# Patient Record
Sex: Female | Born: 1950
Health system: Southern US, Community
[De-identification: ages and names within clinical notes are randomized; demographics above are authoritative.]

## PROBLEM LIST (undated history)

## (undated) DIAGNOSIS — K219 Gastro-esophageal reflux disease without esophagitis: Secondary | ICD-10-CM

## (undated) DIAGNOSIS — M1990 Unspecified osteoarthritis, unspecified site: Secondary | ICD-10-CM

## (undated) DIAGNOSIS — D649 Anemia, unspecified: Secondary | ICD-10-CM

## (undated) DIAGNOSIS — I10 Essential (primary) hypertension: Secondary | ICD-10-CM

## (undated) HISTORY — PX: TUBAL LIGATION: SHX77

---

## 1997-06-26 ENCOUNTER — Other Ambulatory Visit: Admission: RE | Admit: 1997-06-26 | Discharge: 1997-06-26 | Payer: Self-pay | Admitting: Internal Medicine

## 1998-10-23 ENCOUNTER — Emergency Department (HOSPITAL_COMMUNITY): Admission: EM | Admit: 1998-10-23 | Discharge: 1998-10-23 | Payer: Self-pay | Admitting: Emergency Medicine

## 1998-10-23 ENCOUNTER — Encounter: Payer: Self-pay | Admitting: Emergency Medicine

## 2002-07-12 ENCOUNTER — Other Ambulatory Visit: Admission: RE | Admit: 2002-07-12 | Discharge: 2002-07-12 | Payer: Self-pay | Admitting: Family Medicine

## 2002-10-25 ENCOUNTER — Emergency Department (HOSPITAL_COMMUNITY): Admission: EM | Admit: 2002-10-25 | Discharge: 2002-10-25 | Payer: Self-pay | Admitting: Emergency Medicine

## 2002-10-25 ENCOUNTER — Encounter: Payer: Self-pay | Admitting: Emergency Medicine

## 2003-09-08 ENCOUNTER — Emergency Department (HOSPITAL_COMMUNITY): Admission: EM | Admit: 2003-09-08 | Discharge: 2003-09-08 | Payer: Self-pay | Admitting: *Deleted

## 2005-03-24 ENCOUNTER — Ambulatory Visit: Payer: Self-pay | Admitting: Family Medicine

## 2005-03-29 ENCOUNTER — Ambulatory Visit: Payer: Self-pay | Admitting: *Deleted

## 2006-04-03 ENCOUNTER — Ambulatory Visit: Payer: Self-pay | Admitting: Family Medicine

## 2006-05-08 ENCOUNTER — Ambulatory Visit: Payer: Self-pay | Admitting: Family Medicine

## 2006-06-02 ENCOUNTER — Ambulatory Visit (HOSPITAL_COMMUNITY): Admission: RE | Admit: 2006-06-02 | Discharge: 2006-06-02 | Payer: Self-pay | Admitting: Family Medicine

## 2006-07-04 ENCOUNTER — Encounter (INDEPENDENT_AMBULATORY_CARE_PROVIDER_SITE_OTHER): Payer: Self-pay | Admitting: Family Medicine

## 2006-07-04 ENCOUNTER — Ambulatory Visit: Payer: Self-pay | Admitting: Family Medicine

## 2006-10-03 ENCOUNTER — Ambulatory Visit: Payer: Self-pay | Admitting: Family Medicine

## 2006-10-04 ENCOUNTER — Ambulatory Visit (HOSPITAL_COMMUNITY): Admission: RE | Admit: 2006-10-04 | Discharge: 2006-10-04 | Payer: Self-pay | Admitting: Family Medicine

## 2006-11-15 ENCOUNTER — Encounter (INDEPENDENT_AMBULATORY_CARE_PROVIDER_SITE_OTHER): Payer: Self-pay | Admitting: *Deleted

## 2008-06-14 ENCOUNTER — Emergency Department (HOSPITAL_COMMUNITY): Admission: EM | Admit: 2008-06-14 | Discharge: 2008-06-14 | Payer: Self-pay | Admitting: Emergency Medicine

## 2008-10-24 ENCOUNTER — Ambulatory Visit: Payer: Self-pay | Admitting: Family Medicine

## 2008-10-24 LAB — CONVERTED CEMR LAB
ALT: 16 units/L (ref 0–35)
Basophils Relative: 0 % (ref 0–1)
CO2: 22 meq/L (ref 19–32)
Cholesterol: 260 mg/dL — ABNORMAL HIGH (ref 0–200)
Creatinine, Ser: 0.73 mg/dL (ref 0.40–1.20)
Eosinophils Absolute: 0.1 10*3/uL (ref 0.0–0.7)
MCHC: 33.9 g/dL (ref 30.0–36.0)
MCV: 89.4 fL (ref 78.0–100.0)
Neutrophils Relative %: 57 % (ref 43–77)
Platelets: 369 10*3/uL (ref 150–400)
RDW: 14.7 % (ref 11.5–15.5)
Total Bilirubin: 0.7 mg/dL (ref 0.3–1.2)
Total CHOL/HDL Ratio: 4.1
VLDL: 32 mg/dL (ref 0–40)

## 2008-11-11 ENCOUNTER — Ambulatory Visit (HOSPITAL_COMMUNITY): Admission: RE | Admit: 2008-11-11 | Discharge: 2008-11-11 | Payer: Self-pay | Admitting: Family Medicine

## 2008-11-14 ENCOUNTER — Ambulatory Visit: Payer: Self-pay | Admitting: Family Medicine

## 2008-11-26 ENCOUNTER — Ambulatory Visit (HOSPITAL_COMMUNITY): Admission: RE | Admit: 2008-11-26 | Discharge: 2008-11-26 | Payer: Self-pay | Admitting: Family Medicine

## 2008-12-03 ENCOUNTER — Encounter: Admission: RE | Admit: 2008-12-03 | Discharge: 2008-12-03 | Payer: Self-pay | Admitting: Family Medicine

## 2009-06-11 ENCOUNTER — Ambulatory Visit: Payer: Self-pay | Admitting: Family Medicine

## 2009-06-11 LAB — CONVERTED CEMR LAB
BUN: 11 mg/dL (ref 6–23)
CO2: 23 meq/L (ref 19–32)
Calcium: 9.7 mg/dL (ref 8.4–10.5)
Creatinine, Ser: 0.68 mg/dL (ref 0.40–1.20)
Glucose, Bld: 86 mg/dL (ref 70–99)
HDL: 68 mg/dL (ref >39)
Rhuematoid fact SerPl-aCnc: <20 intl units/mL (ref 0–20)
Sed Rate: 6 mm/hr (ref 0–22)

## 2009-10-06 ENCOUNTER — Ambulatory Visit: Payer: Self-pay | Admitting: Internal Medicine

## 2010-01-22 ENCOUNTER — Ambulatory Visit (HOSPITAL_COMMUNITY): Admission: RE | Admit: 2010-01-22 | Discharge: 2010-01-22 | Payer: Self-pay | Admitting: Internal Medicine

## 2010-03-21 ENCOUNTER — Encounter: Payer: Self-pay | Admitting: Family Medicine

## 2011-02-14 ENCOUNTER — Other Ambulatory Visit (HOSPITAL_COMMUNITY): Payer: Self-pay | Admitting: Family Medicine

## 2011-02-14 DIAGNOSIS — Z1231 Encounter for screening mammogram for malignant neoplasm of breast: Secondary | ICD-10-CM

## 2011-03-18 ENCOUNTER — Ambulatory Visit (HOSPITAL_COMMUNITY): Payer: Self-pay | Attending: Family Medicine

## 2011-09-06 ENCOUNTER — Ambulatory Visit: Payer: Self-pay | Admitting: Internal Medicine

## 2011-09-06 DIAGNOSIS — Z0289 Encounter for other administrative examinations: Secondary | ICD-10-CM

## 2011-10-12 ENCOUNTER — Emergency Department (HOSPITAL_COMMUNITY)
Admission: EM | Admit: 2011-10-12 | Discharge: 2011-10-12 | Disposition: A | Discharge status: Home / Self Care | Payer: 59 | Attending: Emergency Medicine | Admitting: Emergency Medicine

## 2011-10-12 ENCOUNTER — Encounter (HOSPITAL_COMMUNITY): Payer: Self-pay | Admitting: Emergency Medicine

## 2011-10-12 DIAGNOSIS — M1990 Unspecified osteoarthritis, unspecified site: Secondary | ICD-10-CM

## 2011-10-12 DIAGNOSIS — M129 Arthropathy, unspecified: Secondary | ICD-10-CM | POA: Insufficient documentation

## 2011-10-12 DIAGNOSIS — Z760 Encounter for issue of repeat prescription: Secondary | ICD-10-CM | POA: Insufficient documentation

## 2011-10-12 DIAGNOSIS — I10 Essential (primary) hypertension: Secondary | ICD-10-CM | POA: Insufficient documentation

## 2011-10-12 HISTORY — DX: Unspecified osteoarthritis, unspecified site: M19.90

## 2011-10-12 HISTORY — DX: Essential (primary) hypertension: I10

## 2011-10-12 MED ORDER — DICLOFENAC SODIUM 75 MG PO TBEC: 75.0000 mg | DELAYED_RELEASE_TABLET | Freq: Two times a day (BID) | ORAL | Status: DC

## 2011-10-12 MED ORDER — HYDROCHLOROTHIAZIDE 12.5 MG PO TABS: 25.0000 mg | ORAL_TABLET | Freq: Every day | ORAL | Status: DC

## 2011-10-12 NOTE — ED Notes (Signed)
States has not taken BP meds in 2 months due to no PCP-denies chest pain, slight headache, dizziness-will continue to monitor

## 2011-10-12 NOTE — ED Provider Notes (Signed)
History     CSN: [id number]  Arrival date & time 10/12/11  1323   First MD Initiated Contact with Patient 10/12/11 1338      Chief Complaint  Patient presents with  . Hypertension    (Consider location/radiation/quality/duration/timing/severity/associated sxs/prior treatment) HPI  Pt presents to the ER with complaints of needing medication refills. She admits that she has not seen her doctor in 1 year but has an appointment coming up next Wednesday. Her employer today took her blood pressure and had her sent to the ED because it was elevated. She denies having any symptoms. She states that she also has arthritis medication that she needs refilled. BP 186/104 and the rest of her VS are stable.  Past Medical History  Diagnosis Date  . Hypertension   . Arthritis     History reviewed. No pertinent past surgical history.  No family history on file.  History  Substance Use Topics  . Smoking status: Never Smoker   . Smokeless tobacco: Not on file  . Alcohol Use: No    OB History    Grav Para Term Preterm Abortions TAB SAB Ect Mult Living                  Review of Systems   HEENT: denies blurry vision or change in hearing PULMONARY: Denies difficulty breathing and SOB CARDIAC: denies chest pain or heart palpitations MUSCULOSKELETAL:  denies being unable to ambulate ABDOMEN AL: denies abdominal pain GU: denies loss of bowel or urinary control NEURO: denies numbness and tingling in extremities SKIN: no new rashes PSYCH: patient denies anxiety or depression. NECK: Pt denies having neck pain   ]  Allergies  Shellfish allergy  Home Medications   Current Outpatient Rx  Name Route Sig Dispense Refill  . IBUPROFEN 200 MG PO TABS Oral Take 400 mg by mouth every 6 (six) hours as needed. Headache///arthritis pain    . DICLOFENAC SODIUM 75 MG PO TBEC Oral Take 1 tablet (75 mg total) by mouth 2 (two) times daily. 16 tablet 0  . HYDROCHLOROTHIAZIDE 12.5 MG PO TABS Oral  Take 2 tablets (25 mg total) by mouth daily. 8 tablet 0    BP 196/102  Pulse 63  Temp 98.8 F (37.1 C) (Oral)  Resp 20  SpO2 100%  Physical Exam  Nursing note and vitals reviewed. Constitutional: She appears well-developed and well-nourished. No distress.  HENT:  Head: Normocephalic and atraumatic.  Eyes: Pupils are equal, round, and reactive to light.  Neck: Normal range of motion. Neck supple.  Cardiovascular: Normal rate and regular rhythm.   Pulmonary/Chest: Effort normal.  Abdominal: Soft.  Neurological: She is alert.  Skin: Skin is warm and dry.    ED Course  Procedures (including critical care time)  Labs Reviewed - No data to display No results found.   1. Hypertension   2. Arthritis   3. Medication refill       MDM  Pt not having any red flag symptoms.   I have written her for 1 week of diclofenac and 1 week of hydrochlorothiazide. Dr. Audria Nine can change these or refill them at her upcomming appointment this Wednesday.  Pt has been advised of the symptoms that warrant their return to the ED. Patient has voiced understanding and has agreed to follow-up with the PCP or specialist.           Dorthula Matas, PA 10/12/11 1448

## 2011-10-12 NOTE — ED Provider Notes (Signed)
Medical screening examination/treatment/procedure(s) were performed by non-physician practitioner and as supervising physician I was immediately available for consultation/collaboration.  Juliet Rude. Rubin Payor, MD 10/12/11 1558

## 2011-10-12 NOTE — ED Notes (Signed)
Pt presenting to ed with c/o at elevated blood pressure pt denies chest pain, shortness of breath at this time. Pt states she has been out of her blood pressure medications x 2 months because she is a patient at [facility] and they are closed.pt states slight headache pain

## 2011-10-19 ENCOUNTER — Encounter: Payer: Self-pay | Admitting: Family Medicine

## 2011-10-19 ENCOUNTER — Ambulatory Visit (INDEPENDENT_AMBULATORY_CARE_PROVIDER_SITE_OTHER): Payer: 59 | Admitting: Family Medicine

## 2011-10-19 ENCOUNTER — Ambulatory Visit: Payer: 59

## 2011-10-19 VITALS — BP 126/90 | HR 76 | Temp 98.3°F | Resp 16 | Ht 61.0 in | Wt 139.8 lb

## 2011-10-19 DIAGNOSIS — I10 Essential (primary) hypertension: Secondary | ICD-10-CM

## 2011-10-19 DIAGNOSIS — Z13220 Encounter for screening for lipoid disorders: Secondary | ICD-10-CM

## 2011-10-19 DIAGNOSIS — M159 Polyosteoarthritis, unspecified: Secondary | ICD-10-CM

## 2011-10-19 DIAGNOSIS — M25562 Pain in left knee: Secondary | ICD-10-CM

## 2011-10-19 LAB — COMPREHENSIVE METABOLIC PANEL
ALT: 16 U/L (ref 0–35)
Albumin: 4.3 g/dL (ref 3.5–5.2)
CO2: 28 mEq/L (ref 19–32)
Calcium: 9.7 mg/dL (ref 8.4–10.5)
Chloride: 105 mEq/L (ref 96–112)
Potassium: 4 mEq/L (ref 3.5–5.3)
Sodium: 140 mEq/L (ref 135–145)
Total Protein: 7 g/dL (ref 6.0–8.3)

## 2011-10-19 LAB — T4, FREE: Free T4: 1.1 ng/dL (ref 0.80–1.80)

## 2011-10-19 MED ORDER — NABUMETONE 500 MG PO TABS: 500.0000 mg | ORAL_TABLET | Freq: Two times a day (BID) | ORAL | Status: DC

## 2011-10-19 MED ORDER — HYDROCHLOROTHIAZIDE 25 MG PO TABS: 25.0000 mg | ORAL_TABLET | Freq: Every day | ORAL | Status: DC

## 2011-10-19 NOTE — Progress Notes (Signed)
S: This 61 y.o. AA female has HTN and chronic left knee pain with swelling. She was seen at [facility] ED on 10/12/2011. Meds prescribed: HCTZ and Diclfonac(ineffective for pain). Cannot sleep because of pain. No relief with change of positions. No relief with Ibuprofen. Work involves walking and pt is on feet 80% of time. Prolonged sitting causes stiffness. No history of injury.  Re: HTN- pt denies any medication side effect; denies HA, visual disturbances, CP, SOB, chest tightness, palpitations, dizziness, lightheadedness, syncope or weakness.   O:  Filed Vitals:   10/19/11 1027  BP: 126/90  Pulse: 76  Temp: 98.3 F (36.8 C)  Resp: 16   GEN: In NAD; in mild distress when weight-bearing HENT: [place]/AT; EOMI; conj/sclerae clear COR: RRR LUNGS: Normal resp rate and effort MS: Left knee- mild deformity but no effusion; tender with palpation at joint line (medial > lateral aspect); ROM limited to 90 degrees of flexion. No joint laxity. McMurray's + NEURO: A&O x 3; CNs intact, otherwise nonfocal.   [facility] reading (PRIMARY) by  Dr. Audria Nine: Left knee- joint space narrowing with spurring; no fracture or dislocation.   A/P:  1. Knee pain, left - DJD evident on xray  Vitamin D, 25-hydroxy RX: Nabumetone 500 mg  1 tablet bid with meals Get OTC Flax Seed Oil capsules and take 1 daily Apply Arnica gel to joint 2-3 x day  2. HTN (hypertension)  Comprehensive metabolic panel, T4, Free RX; HCTZ 25 mg  1 tablet daily Nutrition changes  3. Screening for hyperlipidemia  LDL Cholesterol, Direct

## 2011-10-19 NOTE — Patient Instructions (Addendum)
Degenerative Arthritis You have osteoarthritis. This is the wear and tear arthritis that comes with aging. It is also called degenerative arthritis. This is common in people past middle age. It is caused by stress on the joints. The large weight bearing joints of the lower extremities are most often affected. The knees, hips, back, neck, and hands can become painful, swollen, and stiff. This is the most common type of arthritis. It comes on with age, carrying too much weight, or from an injury. Treatment includes resting the sore joint until the pain and swelling improve. Crutches or a walker may be needed for severe flares. Only take over-the-counter or prescription medicines for pain, discomfort, or fever as directed by your caregiver. Local heat therapy may improve motion. Cortisone shots into the joint are sometimes used to reduce pain and swelling during flares. Osteoarthritis is usually not crippling and progresses slowly. There are things you can do to decrease pain:  Avoid high impact activities.   Exercise regularly.   Low impact exercises such as walking, biking and swimming help to keep the muscles strong and keep normal joint function.   Stretching helps to keep your range of motion.   Lose weight if you are overweight. This reduces joint stress.  In severe cases when you have pain at rest or increasing disability, joint surgery may be helpful. See your caregiver for follow-up treatment as recommended.  SEEK IMMEDIATE MEDICAL CARE IF:   You have severe joint pain.   Marked swelling and redness in your joint develops.   You develop a high fever.  Document Released: 02/14/2005 Document Revised: 02/03/2011 Document Reviewed: 07/17/2006 [facility] Patient Information 2012 [place], [place].   Hypertension As your heart beats, it forces blood through your arteries. This force is your blood pressure. If the pressure is too high, it is called hypertension (HTN) or high blood pressure. HTN  is dangerous because you may have it and not know it. High blood pressure may mean that your heart has to work harder to pump blood. Your arteries may be narrow or stiff. The extra work puts you at risk for heart disease, stroke, and other problems.  Blood pressure consists of two numbers, a higher number over a lower, 110/72, for example. It is stated as "110 over 72." The ideal is below 120 for the top number (systolic) and under 80 for the bottom (diastolic). Write down your blood pressure today. You should pay close attention to your blood pressure if you have certain conditions such as:  Heart failure.   Prior heart attack.   Diabetes   Chronic kidney disease.   Prior stroke.   Multiple risk factors for heart disease.  To see if you have HTN, your blood pressure should be measured while you are seated with your arm held at the level of the heart. It should be measured at least twice. A one-time elevated blood pressure reading (especially in the Emergency Department) does not mean that you need treatment. There may be conditions in which the blood pressure is different between your right and left arms. It is important to see your caregiver soon for a recheck. Most people have essential hypertension which means that there is not a specific cause. This type of high blood pressure may be lowered by changing lifestyle factors such as:  Stress.   Smoking.   Lack of exercise.   Excessive weight.   Drug/tobacco/alcohol use.   Eating less salt.  Most people do not have symptoms from high  blood pressure until it has caused damage to the body. Effective treatment can often prevent, delay or reduce that damage. TREATMENT  When a cause has been identified, treatment for high blood pressure is directed at the cause. There are a large number of medications to treat HTN. These fall into several categories, and your caregiver will help you select the medicines that are best for you. Medications  may have side effects. You should review side effects with your caregiver. If your blood pressure stays high after you have made lifestyle changes or started on medicines,   Your medication(s) may need to be changed.   Other problems may need to be addressed.   Be certain you understand your prescriptions, and know how and when to take your medicine.   Be sure to follow up with your caregiver within the time frame advised (usually within two weeks) to have your blood pressure rechecked and to review your medications.   If you are taking more than one medicine to lower your blood pressure, make sure you know how and at what times they should be taken. Taking two medicines at the same time can result in blood pressure that is too low.  SEEK IMMEDIATE MEDICAL CARE IF:  You develop a severe headache, blurred or changing vision, or confusion.   You have unusual weakness or numbness, or a faint feeling.   You have severe chest or abdominal pain, vomiting, or breathing problems.  MAKE SURE YOU:   Understand these instructions.   Will watch your condition.   Will get help right away if you are not doing well or get worse.  Document Released: 02/14/2005 Document Revised: 02/03/2011 Document Reviewed: 10/05/2007 [facility] Patient Information 2012 [place], [place].   Get over-the-counter Flax Seed Oil capsules  1000-1200 mg per capsule and take 1 capsule daily; this will help reduce joint pain. Apply Arnica gel to painful joint 2-3 times daily for pain relief. If joint swells, try applying ice pack for 10 minutes twice a day.

## 2011-10-21 NOTE — Progress Notes (Signed)
Quick Note:  Please call pt and advise that the following labs are abnormal...  Vitamin D deficiency- Vitamin D 50,000 IU has been prescribed with refills. Take 1 capsule once a week.  Your chemistries are normal, blood sugar, kidney and liver tests are normal. LDL ("bad") cholesterol is elevated. Try to improve nutrition and take the Flax seed Oil as recommended. Some type of exercise will help improve your cholesterol numbers.  Copy to pt. ______

## 2011-12-22 ENCOUNTER — Ambulatory Visit: Payer: 59 | Admitting: Family Medicine

## 2011-12-27 ENCOUNTER — Ambulatory Visit: Payer: 59 | Admitting: Family Medicine

## 2012-02-24 ENCOUNTER — Other Ambulatory Visit: Payer: Self-pay | Admitting: Family Medicine

## 2012-02-24 MED ORDER — NABUMETONE 500 MG PO TABS: 500.0000 mg | ORAL_TABLET | Freq: Two times a day (BID) | ORAL | Status: DC

## 2012-02-24 MED ORDER — HYDROCHLOROTHIAZIDE 25 MG PO TABS: 25.0000 mg | ORAL_TABLET | Freq: Every day | ORAL | Status: DC

## 2012-05-24 ENCOUNTER — Ambulatory Visit: Payer: 59 | Admitting: Family Medicine

## 2012-06-20 ENCOUNTER — Encounter: Payer: Self-pay | Admitting: Family Medicine

## 2012-06-20 ENCOUNTER — Ambulatory Visit (INDEPENDENT_AMBULATORY_CARE_PROVIDER_SITE_OTHER): Payer: 59 | Admitting: Family Medicine

## 2012-06-20 VITALS — BP 142/90 | HR 87 | Temp 98.2°F | Resp 16 | Ht 61.75 in | Wt 129.2 lb

## 2012-06-20 DIAGNOSIS — M25569 Pain in unspecified knee: Secondary | ICD-10-CM

## 2012-06-20 DIAGNOSIS — G8929 Other chronic pain: Secondary | ICD-10-CM

## 2012-06-20 DIAGNOSIS — I10 Essential (primary) hypertension: Secondary | ICD-10-CM

## 2012-06-20 DIAGNOSIS — Z760 Encounter for issue of repeat prescription: Secondary | ICD-10-CM

## 2012-06-20 MED ORDER — HYDROCODONE-ACETAMINOPHEN 10-325 MG PO TABS: ORAL_TABLET | ORAL | Status: DC

## 2012-06-20 MED ORDER — NABUMETONE 500 MG PO TABS: 500.0000 mg | ORAL_TABLET | Freq: Two times a day (BID) | ORAL | Status: DC

## 2012-06-20 MED ORDER — HYDROCHLOROTHIAZIDE 25 MG PO TABS: 25.0000 mg | ORAL_TABLET | Freq: Every day | ORAL | Status: DC

## 2012-06-20 NOTE — Progress Notes (Signed)
S: This 62 y.o. AA female c/o chronic L knee pain pain which is getting worse; certain sudden turns/ movements cause excruciating pain. Despite taking prescription NSAID, pain is interrupting sleep. Her work requires that she be on her feet for 6+ hours/day, 5-6 days per week. Pt denies significant swelling in joint and has no hx of recent trauma.  Re: HTN- pt is compliant w/ medication w/o side effects. She denies fatigue, diaphoresis, CP or tightness, palpitations, muscle cramps, HA, dizziness, lightheadedness, numbness or syncope.  PMHx, Soc HX and Fam Hx reviewed.  ROS: As per HPI.  O: Filed Vitals:   06/20/12 1131  BP: 142/90  Pulse: 87  Temp: 98.2 F (36.8 C)  Resp: 16   GEN: In NAD; WN,WD. HENT: [place]/AT; EOMI w/ clear conj/ sclerae. Otherwise normal. COR: RRR. No edema. LUNGS: Normal resp rate and effort. MS: L knee- degenerative changes w/ deformity of tibial plateau medially. No effusion or redness. ROM decreased. NEURO: A&O x 3; CNs intact. Nonfocal. Gait- antalgic (favoring L leg).  XRAY (Aug 2013): L knee- moderate and progressive tricompartment OA.    A/P: Chronic knee pain, left - increasing pain and disability.  Plan: MR Knee Left  Wo Contrast; continue current medications. RX: HC-APAP 10-325   Take 1/2- 1 tablet at bedtime for pain.  HTN, goal below 140/90- stable; no medication change at this time.  Issue of repeat prescriptions  Meds ordered this encounter  Medications  . hydrochlorothiazide (HYDRODIURIL) 25 MG tablet    Sig: Take 1 tablet (25 mg total) by mouth daily.    Dispense:  30 tablet    Refill:  5  . nabumetone (RELAFEN) 500 MG tablet    Sig: Take 1 tablet (500 mg total) by mouth 2 (two) times daily. Take tablet with meal.    Dispense:  60 tablet    Refill:  3  . HYDROcodone-acetaminophen (NORCO) 10-325 MG per tablet    Sig: Take 1/2 or 1 tablet at bedtime for pain.    Dispense:  30 tablet    Refill:  0

## 2012-06-20 NOTE — Patient Instructions (Addendum)
I have ordered an MRI of left knee to get more details about knee pain. The medication prescribed for pain should be taken only at bedtime so that you can rest better. When you return in 1 month, we will discuss if you need to be referred to an Orthopedist for further evaluation.

## 2012-06-22 ENCOUNTER — Ambulatory Visit (HOSPITAL_COMMUNITY)
Admission: RE | Admit: 2012-06-22 | Discharge: 2012-06-22 | Disposition: A | Discharge status: Home / Self Care | Payer: 59 | Source: Ambulatory Visit | Attending: Family Medicine | Admitting: Family Medicine

## 2012-06-22 DIAGNOSIS — M7120 Synovial cyst of popliteal space [Baker], unspecified knee: Secondary | ICD-10-CM | POA: Insufficient documentation

## 2012-06-22 DIAGNOSIS — M25569 Pain in unspecified knee: Secondary | ICD-10-CM | POA: Insufficient documentation

## 2012-06-22 DIAGNOSIS — G8929 Other chronic pain: Secondary | ICD-10-CM

## 2012-06-22 DIAGNOSIS — M25562 Pain in left knee: Secondary | ICD-10-CM

## 2012-06-22 DIAGNOSIS — M25469 Effusion, unspecified knee: Secondary | ICD-10-CM | POA: Insufficient documentation

## 2012-06-24 ENCOUNTER — Telehealth: Payer: Self-pay | Admitting: Family Medicine

## 2012-06-24 DIAGNOSIS — M25562 Pain in left knee: Secondary | ICD-10-CM

## 2012-06-24 NOTE — Telephone Encounter (Signed)
I spoke w/ pt about MRI-knee results (torn meniscus, etc). Advised ORTHO evaluation; she will probably need arthroscopic procedure. She agrees to referral.

## 2012-07-31 ENCOUNTER — Other Ambulatory Visit: Payer: Self-pay | Admitting: Orthopedic Surgery

## 2012-08-03 NOTE — Pre-Procedure Instructions (Signed)
Renee Rodriguez  08/03/2012   Your procedure is scheduled on:  Monday, June 16th.  Come to [facility], go to [facility], take elevator to 3rd Floor , Short Stay.  Report to [facility] at 5:30 AM.  Call this number if you have problems the morning of surgery: [phone]   Remember:   Do not eat food or drink liquids after midnight.   Take these medicines the morning of surgery with A SIP OF WATER: HYDROcodone-acetaminophen (NORCO) if needed.  Stop taking Aspirin, Coumadin, Plavix, Effient and Herbal medications.  Don not take any NSAIDs ie: nabumetone (RELAFEN)   Ibuprofen,  Advil,Naproxen or any medication containing Aspirin.  Do not wear jewelry, make-up or nail polish.  Do not wear lotions, powders, or perfumes. You may wear deodorant.  Do not shave 48 hours prior to surgery. Men may shave face and neck.  Do not bring valuables to the hospital.  [facility] is not responsible for any belongings or valuables.  Contacts, dentures or bridgework may not be worn into surgery.  Leave suitcase in the car. After surgery it may be brought to your room.  For patients admitted to the hospital, checkout time is 11:00 AM the day of discharge.    Special Instructions: Shower using CHG 2 nights before surgery and the night before surgery.  If you shower the day of surgery use CHG.  Use special wash - you have one bottle of CHG for all showers.  You should use approximately 1/3 of the bottle for each shower. N/A   Please read over the following fact sheets that you were given: Pain Booklet, Coughing and Deep Breathing, Blood Transfusion Information and Surgical Site Infection Prevention

## 2012-08-07 ENCOUNTER — Other Ambulatory Visit: Payer: Self-pay | Admitting: Orthopedic Surgery

## 2012-08-07 MED ORDER — BUPIVACAINE LIPOSOME 1.3 % IJ SUSP: 20.0000 mL | Freq: Once | INTRAMUSCULAR | Status: DC

## 2012-08-08 ENCOUNTER — Encounter (HOSPITAL_COMMUNITY)
Admission: RE | Admit: 2012-08-08 | Discharge: 2012-08-08 | Disposition: A | Discharge status: Home / Self Care | Payer: 59 | Source: Ambulatory Visit | Attending: Orthopedic Surgery | Admitting: Orthopedic Surgery

## 2012-08-08 ENCOUNTER — Encounter (HOSPITAL_COMMUNITY): Payer: Self-pay

## 2012-08-08 ENCOUNTER — Encounter (HOSPITAL_COMMUNITY): Payer: Self-pay | Admitting: Pharmacy Technician

## 2012-08-08 DIAGNOSIS — Z01811 Encounter for preprocedural respiratory examination: Secondary | ICD-10-CM | POA: Insufficient documentation

## 2012-08-08 DIAGNOSIS — Z01818 Encounter for other preprocedural examination: Secondary | ICD-10-CM | POA: Insufficient documentation

## 2012-08-08 DIAGNOSIS — Z01810 Encounter for preprocedural cardiovascular examination: Secondary | ICD-10-CM | POA: Insufficient documentation

## 2012-08-08 DIAGNOSIS — Z01812 Encounter for preprocedural laboratory examination: Secondary | ICD-10-CM | POA: Insufficient documentation

## 2012-08-08 HISTORY — DX: Gastro-esophageal reflux disease without esophagitis: K21.9

## 2012-08-08 HISTORY — DX: Anemia, unspecified: D64.9

## 2012-08-08 LAB — COMPREHENSIVE METABOLIC PANEL
AST: 24 U/L (ref 0–37)
BUN: 12 mg/dL (ref 6–23)
CO2: 31 mEq/L (ref 19–32)
Chloride: 101 mEq/L (ref 96–112)
Creatinine, Ser: 0.5 mg/dL (ref 0.50–1.10)
GFR calc non Af Amer: >90 mL/min (ref >90)
Total Bilirubin: 0.3 mg/dL (ref 0.3–1.2)

## 2012-08-08 LAB — URINE MICROSCOPIC-ADD ON

## 2012-08-08 LAB — CBC WITH DIFFERENTIAL/PLATELET
HCT: 43.3 % (ref 36.0–46.0)
Hemoglobin: 14.8 g/dL (ref 12.0–15.0)
Lymphocytes Relative: 40 % (ref 12–46)
Monocytes Absolute: 0.5 10*3/uL (ref 0.1–1.0)
Monocytes Relative: 6 % (ref 3–12)
Neutro Abs: 4.1 10*3/uL (ref 1.7–7.7)
WBC: 7.8 10*3/uL (ref 4.0–10.5)

## 2012-08-08 LAB — URINALYSIS, ROUTINE W REFLEX MICROSCOPIC
Bilirubin Urine: NEGATIVE
Hgb urine dipstick: NEGATIVE
Ketones, ur: NEGATIVE mg/dL
Nitrite: NEGATIVE
pH: 8 (ref 5.0–8.0)

## 2012-08-08 LAB — PROTIME-INR: INR: 0.99 (ref 0.00–1.49)

## 2012-08-08 LAB — SURGICAL PCR SCREEN
MRSA, PCR: NEGATIVE
Staphylococcus aureus: NEGATIVE

## 2012-08-08 LAB — TYPE AND SCREEN: ABO/RH(D): AB POS

## 2012-08-08 LAB — APTT: aPTT: 26 seconds (ref 24–37)

## 2012-08-08 NOTE — Progress Notes (Signed)
Primary physician - dr. Audria Nine No cardiologist no previous cardiac testing

## 2012-08-09 ENCOUNTER — Ambulatory Visit: Payer: 59 | Admitting: Family Medicine

## 2012-08-09 LAB — URINE CULTURE: Colony Count: 35000

## 2012-08-12 MED ORDER — CEFAZOLIN SODIUM-DEXTROSE 2-3 GM-% IV SOLR
2.0000 g | INTRAVENOUS | Status: AC
  Administered 2012-08-13: 2 g via INTRAVENOUS

## 2012-08-12 MED ORDER — TRANEXAMIC ACID 100 MG/ML IV SOLN
1000.0000 mg | INTRAVENOUS | Status: AC
  Administered 2012-08-13: 1000 mg via INTRAVENOUS
  Filled 2012-08-12: qty 10

## 2012-08-13 ENCOUNTER — Encounter (HOSPITAL_COMMUNITY): Payer: Self-pay | Admitting: Anesthesiology

## 2012-08-13 ENCOUNTER — Ambulatory Visit (HOSPITAL_COMMUNITY): Payer: 59 | Admitting: Anesthesiology

## 2012-08-13 ENCOUNTER — Encounter (HOSPITAL_COMMUNITY)
Admission: RE | Disposition: A | Discharge status: Home Health | Payer: Self-pay | Source: Ambulatory Visit | Attending: Orthopedic Surgery

## 2012-08-13 ENCOUNTER — Encounter (HOSPITAL_COMMUNITY): Payer: Self-pay | Admitting: *Deleted

## 2012-08-13 ENCOUNTER — Inpatient Hospital Stay (HOSPITAL_COMMUNITY)
Admission: RE | Admit: 2012-08-13 | Discharge: 2012-08-15 | DRG: 470 | Disposition: A | Discharge status: Home Health | Payer: 59 | Source: Ambulatory Visit | Attending: Orthopedic Surgery | Admitting: Orthopedic Surgery

## 2012-08-13 DIAGNOSIS — D62 Acute posthemorrhagic anemia: Secondary | ICD-10-CM | POA: Diagnosis not present

## 2012-08-13 DIAGNOSIS — M25562 Pain in left knee: Secondary | ICD-10-CM

## 2012-08-13 DIAGNOSIS — M1710 Unilateral primary osteoarthritis, unspecified knee: Principal | ICD-10-CM | POA: Diagnosis present

## 2012-08-13 DIAGNOSIS — I10 Essential (primary) hypertension: Secondary | ICD-10-CM | POA: Diagnosis present

## 2012-08-13 DIAGNOSIS — Z87891 Personal history of nicotine dependence: Secondary | ICD-10-CM

## 2012-08-13 DIAGNOSIS — K219 Gastro-esophageal reflux disease without esophagitis: Secondary | ICD-10-CM | POA: Diagnosis present

## 2012-08-13 HISTORY — PX: TOTAL KNEE ARTHROPLASTY: SHX125

## 2012-08-13 LAB — CREATININE, SERUM
Creatinine, Ser: 0.45 mg/dL — ABNORMAL LOW (ref 0.50–1.10)
GFR calc Af Amer: >90 mL/min (ref >90)

## 2012-08-13 LAB — CBC
MCH: 30.4 pg (ref 26.0–34.0)
Platelets: 362 10*3/uL (ref 150–400)
RBC: 4.34 MIL/uL (ref 3.87–5.11)
WBC: 13.5 10*3/uL — ABNORMAL HIGH (ref 4.0–10.5)

## 2012-08-13 SURGERY — ARTHROPLASTY, KNEE, TOTAL
Anesthesia: General | Site: Knee | Laterality: Left | Wound class: Clean

## 2012-08-13 MED ORDER — ONDANSETRON HCL 4 MG/2ML IJ SOLN
4.0000 mg | Freq: Four times a day (QID) | INTRAMUSCULAR | Status: DC | PRN
  Administered 2012-08-13: 4 mg via INTRAVENOUS
  Filled 2012-08-13: qty 2

## 2012-08-13 MED ORDER — BUPIVACAINE HCL (PF) 0.5 % IJ SOLN
INTRAMUSCULAR | Status: DC | PRN
  Administered 2012-08-13: 30 mL

## 2012-08-13 MED ORDER — ACETAMINOPHEN 650 MG RE SUPP: 650.0000 mg | Freq: Four times a day (QID) | RECTAL | Status: DC | PRN

## 2012-08-13 MED ORDER — FLEET ENEMA 7-19 GM/118ML RE ENEM: 1.0000 | ENEMA | Freq: Once | RECTAL | Status: AC | PRN

## 2012-08-13 MED ORDER — CEFAZOLIN SODIUM 1-5 GM-% IV SOLN
1.0000 g | Freq: Four times a day (QID) | INTRAVENOUS | Status: AC
  Administered 2012-08-13 (×2): 1 g via INTRAVENOUS
  Filled 2012-08-13 (×3): qty 50

## 2012-08-13 MED ORDER — METOCLOPRAMIDE HCL 5 MG/ML IJ SOLN
5.0000 mg | Freq: Three times a day (TID) | INTRAMUSCULAR | Status: DC | PRN
  Administered 2012-08-13: 10 mg via INTRAVENOUS
  Filled 2012-08-13: qty 2

## 2012-08-13 MED ORDER — MENTHOL 3 MG MT LOZG: 1.0000 | LOZENGE | OROMUCOSAL | Status: DC | PRN

## 2012-08-13 MED ORDER — OXYCODONE HCL ER 10 MG PO T12A
10.0000 mg | EXTENDED_RELEASE_TABLET | Freq: Two times a day (BID) | ORAL | Status: DC
  Administered 2012-08-13 – 2012-08-15 (×4): 10 mg via ORAL
  Filled 2012-08-13 (×5): qty 1

## 2012-08-13 MED ORDER — SODIUM CHLORIDE 0.9 % IV SOLN: INTRAVENOUS | Status: DC

## 2012-08-13 MED ORDER — DEXTROSE 5 % IV SOLN
500.0000 mg | Freq: Four times a day (QID) | INTRAVENOUS | Status: DC | PRN
  Filled 2012-08-13: qty 5

## 2012-08-13 MED ORDER — SODIUM CHLORIDE 0.9 % IJ SOLN
INTRAMUSCULAR | Status: DC | PRN
  Administered 2012-08-13: 08:00:00

## 2012-08-13 MED ORDER — METHOCARBAMOL 500 MG PO TABS
500.0000 mg | ORAL_TABLET | Freq: Four times a day (QID) | ORAL | Status: DC | PRN
  Administered 2012-08-13 – 2012-08-15 (×7): 500 mg via ORAL
  Filled 2012-08-13 (×6): qty 1

## 2012-08-13 MED ORDER — CEFAZOLIN SODIUM-DEXTROSE 2-3 GM-% IV SOLR
INTRAVENOUS | Status: AC
  Filled 2012-08-13: qty 50

## 2012-08-13 MED ORDER — DIPHENHYDRAMINE HCL 12.5 MG/5ML PO ELIX: 12.5000 mg | ORAL_SOLUTION | ORAL | Status: DC | PRN

## 2012-08-13 MED ORDER — ENOXAPARIN SODIUM 30 MG/0.3ML ~~LOC~~ SOLN
30.0000 mg | Freq: Two times a day (BID) | SUBCUTANEOUS | Status: DC
  Administered 2012-08-14 – 2012-08-15 (×3): 30 mg via SUBCUTANEOUS
  Filled 2012-08-13 (×5): qty 0.3

## 2012-08-13 MED ORDER — PROPOFOL INFUSION 10 MG/ML OPTIME
INTRAVENOUS | Status: DC | PRN
  Administered 2012-08-13: 25 ug/kg/min via INTRAVENOUS

## 2012-08-13 MED ORDER — SODIUM CHLORIDE 0.9 % IV SOLN
INTRAVENOUS | Status: DC
  Administered 2012-08-13 – 2012-08-14 (×2): via INTRAVENOUS

## 2012-08-13 MED ORDER — NEOSTIGMINE METHYLSULFATE 1 MG/ML IJ SOLN
INTRAMUSCULAR | Status: DC | PRN
  Administered 2012-08-13: 3 mg via INTRAVENOUS

## 2012-08-13 MED ORDER — CHLORHEXIDINE GLUCONATE 4 % EX LIQD: 60.0000 mL | Freq: Once | CUTANEOUS | Status: DC

## 2012-08-13 MED ORDER — SODIUM CHLORIDE 0.9 % IR SOLN
Status: DC | PRN
  Administered 2012-08-13: 3000 mL

## 2012-08-13 MED ORDER — OXYCODONE HCL 5 MG PO TABS
5.0000 mg | ORAL_TABLET | ORAL | Status: DC | PRN
  Administered 2012-08-14 – 2012-08-15 (×8): 10 mg via ORAL
  Filled 2012-08-13 (×9): qty 2

## 2012-08-13 MED ORDER — CELECOXIB 200 MG PO CAPS
200.0000 mg | ORAL_CAPSULE | Freq: Two times a day (BID) | ORAL | Status: DC
  Administered 2012-08-13 – 2012-08-15 (×4): 200 mg via ORAL
  Filled 2012-08-13 (×5): qty 1

## 2012-08-13 MED ORDER — PHENOL 1.4 % MT LIQD: 1.0000 | OROMUCOSAL | Status: DC | PRN

## 2012-08-13 MED ORDER — NABUMETONE 500 MG PO TABS
500.0000 mg | ORAL_TABLET | Freq: Two times a day (BID) | ORAL | Status: DC
  Filled 2012-08-13 (×2): qty 1

## 2012-08-13 MED ORDER — HYDROMORPHONE HCL PF 1 MG/ML IJ SOLN
1.0000 mg | INTRAMUSCULAR | Status: DC | PRN
  Administered 2012-08-13 – 2012-08-14 (×7): 1 mg via INTRAVENOUS
  Filled 2012-08-13 (×7): qty 1

## 2012-08-13 MED ORDER — LIDOCAINE HCL (CARDIAC) 20 MG/ML IV SOLN
INTRAVENOUS | Status: DC | PRN
  Administered 2012-08-13: 80 mg via INTRAVENOUS

## 2012-08-13 MED ORDER — METOCLOPRAMIDE HCL 10 MG PO TABS: 5.0000 mg | ORAL_TABLET | Freq: Three times a day (TID) | ORAL | Status: DC | PRN

## 2012-08-13 MED ORDER — LACTATED RINGERS IV SOLN
INTRAVENOUS | Status: DC | PRN
  Administered 2012-08-13 (×2): via INTRAVENOUS

## 2012-08-13 MED ORDER — HYDROMORPHONE HCL PF 1 MG/ML IJ SOLN
INTRAMUSCULAR | Status: AC
  Filled 2012-08-13: qty 1

## 2012-08-13 MED ORDER — GLYCOPYRROLATE 0.2 MG/ML IJ SOLN
INTRAMUSCULAR | Status: DC | PRN
  Administered 2012-08-13: 0.4 mg via INTRAVENOUS

## 2012-08-13 MED ORDER — ONDANSETRON HCL 4 MG/2ML IJ SOLN
INTRAMUSCULAR | Status: DC | PRN
  Administered 2012-08-13: 4 mg via INTRAVENOUS

## 2012-08-13 MED ORDER — LIDOCAINE HCL 4 % MT SOLN
OROMUCOSAL | Status: DC | PRN
  Administered 2012-08-13: 3 mL via TOPICAL

## 2012-08-13 MED ORDER — DOCUSATE SODIUM 100 MG PO CAPS
100.0000 mg | ORAL_CAPSULE | Freq: Two times a day (BID) | ORAL | Status: DC
  Administered 2012-08-13 – 2012-08-15 (×5): 100 mg via ORAL
  Filled 2012-08-13 (×7): qty 1

## 2012-08-13 MED ORDER — ALUM & MAG HYDROXIDE-SIMETH 200-200-20 MG/5ML PO SUSP: 30.0000 mL | ORAL | Status: DC | PRN

## 2012-08-13 MED ORDER — FENTANYL CITRATE 0.05 MG/ML IJ SOLN
INTRAMUSCULAR | Status: DC | PRN
  Administered 2012-08-13: 100 ug via INTRAVENOUS
  Administered 2012-08-13: 50 ug via INTRAVENOUS
  Administered 2012-08-13: 100 ug via INTRAVENOUS
  Administered 2012-08-13: 50 ug via INTRAVENOUS

## 2012-08-13 MED ORDER — BUPIVACAINE HCL (PF) 0.5 % IJ SOLN
INTRAMUSCULAR | Status: AC
  Filled 2012-08-13: qty 30

## 2012-08-13 MED ORDER — BUPIVACAINE LIPOSOME 1.3 % IJ SUSP
20.0000 mL | INTRAMUSCULAR | Status: AC
  Administered 2012-08-13: 266 mg
  Filled 2012-08-13: qty 20

## 2012-08-13 MED ORDER — ROCURONIUM BROMIDE 100 MG/10ML IV SOLN
INTRAVENOUS | Status: DC | PRN
  Administered 2012-08-13: 40 mg via INTRAVENOUS

## 2012-08-13 MED ORDER — HYDROMORPHONE HCL PF 1 MG/ML IJ SOLN
0.2500 mg | INTRAMUSCULAR | Status: DC | PRN
  Administered 2012-08-13 (×4): 0.5 mg via INTRAVENOUS

## 2012-08-13 MED ORDER — ONDANSETRON HCL 4 MG PO TABS: 4.0000 mg | ORAL_TABLET | Freq: Four times a day (QID) | ORAL | Status: DC | PRN

## 2012-08-13 MED ORDER — PROPOFOL 10 MG/ML IV BOLUS
INTRAVENOUS | Status: DC | PRN
  Administered 2012-08-13: 150 mg via INTRAVENOUS
  Administered 2012-08-13 (×2): 50 mg via INTRAVENOUS

## 2012-08-13 MED ORDER — ZOLPIDEM TARTRATE 5 MG PO TABS: 5.0000 mg | ORAL_TABLET | Freq: Every evening | ORAL | Status: DC | PRN

## 2012-08-13 MED ORDER — MIDAZOLAM HCL 5 MG/5ML IJ SOLN
INTRAMUSCULAR | Status: DC | PRN
  Administered 2012-08-13: 1 mg via INTRAVENOUS

## 2012-08-13 MED ORDER — HYDROCHLOROTHIAZIDE 25 MG PO TABS
25.0000 mg | ORAL_TABLET | Freq: Every day | ORAL | Status: DC
  Administered 2012-08-13 – 2012-08-15 (×3): 25 mg via ORAL
  Filled 2012-08-13 (×3): qty 1

## 2012-08-13 MED ORDER — BISACODYL 5 MG PO TBEC: 5.0000 mg | DELAYED_RELEASE_TABLET | Freq: Every day | ORAL | Status: DC | PRN

## 2012-08-13 MED ORDER — ACETAMINOPHEN 325 MG PO TABS: 650.0000 mg | ORAL_TABLET | Freq: Four times a day (QID) | ORAL | Status: DC | PRN

## 2012-08-13 MED ORDER — SENNOSIDES-DOCUSATE SODIUM 8.6-50 MG PO TABS: 1.0000 | ORAL_TABLET | Freq: Every evening | ORAL | Status: DC | PRN

## 2012-08-13 MED ORDER — LABETALOL HCL 5 MG/ML IV SOLN
INTRAVENOUS | Status: DC | PRN
  Administered 2012-08-13 (×3): 2.5 mg via INTRAVENOUS

## 2012-08-13 MED ORDER — METHOCARBAMOL 500 MG PO TABS
ORAL_TABLET | ORAL | Status: AC
  Filled 2012-08-13: qty 1

## 2012-08-13 SURGICAL SUPPLY — 56 items
BANDAGE ESMARK 6X9 LF (GAUZE/BANDAGES/DRESSINGS) ×1 IMPLANT
BLADE SAGITTAL 13X1.27X60 (BLADE) ×2 IMPLANT
BLADE SAW SGTL 83.5X18.5 (BLADE) ×2 IMPLANT
BNDG ESMARK 6X9 LF (GAUZE/BANDAGES/DRESSINGS) ×2
BOWL SMART MIX CTS (DISPOSABLE) ×2 IMPLANT
CAP CAP FEM/CEM TIBIA/STD ×2 IMPLANT
CATH KIT ON Q 5IN SLV (PAIN MANAGEMENT) ×2 IMPLANT
CEMENT BONE SIMPLEX SPEEDSET (Cement) ×4 IMPLANT
CLOTH BEACON ORANGE TIMEOUT ST (SAFETY) ×2 IMPLANT
COVER SURGICAL LIGHT HANDLE (MISCELLANEOUS) ×2 IMPLANT
CUFF TOURNIQUET SINGLE 34IN LL (TOURNIQUET CUFF) ×2 IMPLANT
DRAPE EXTREMITY T 121X128X90 (DRAPE) ×2 IMPLANT
DRAPE INCISE IOBAN 66X45 STRL (DRAPES) ×4 IMPLANT
DRAPE PROXIMA HALF (DRAPES) ×2 IMPLANT
DRAPE U-SHAPE 47X51 STRL (DRAPES) ×2 IMPLANT
DRSG ADAPTIC 3X8 NADH LF (GAUZE/BANDAGES/DRESSINGS) ×2 IMPLANT
DRSG PAD ABDOMINAL 8X10 ST (GAUZE/BANDAGES/DRESSINGS) ×2 IMPLANT
DURAPREP 26ML APPLICATOR (WOUND CARE) ×4 IMPLANT
ELECT REM PT RETURN 9FT ADLT (ELECTROSURGICAL) ×2
ELECTRODE REM PT RTRN 9FT ADLT (ELECTROSURGICAL) ×1 IMPLANT
EVACUATOR 1/8 PVC DRAIN (DRAIN) ×2 IMPLANT
GLOVE BIOGEL M 7.0 STRL (GLOVE) IMPLANT
GLOVE BIOGEL PI IND STRL 7.5 (GLOVE) IMPLANT
GLOVE BIOGEL PI IND STRL 8.5 (GLOVE) ×1 IMPLANT
GLOVE BIOGEL PI INDICATOR 7.5 (GLOVE)
GLOVE BIOGEL PI INDICATOR 8.5 (GLOVE) ×1
GLOVE BIOGEL PI ORTHO PRO SZ8 (GLOVE) ×1
GLOVE PI ORTHO PRO STRL SZ8 (GLOVE) ×1 IMPLANT
GLOVE SURG ORTHO 8.0 STRL STRW (GLOVE) ×4 IMPLANT
GOWN PREVENTION PLUS XLARGE (GOWN DISPOSABLE) ×4 IMPLANT
GOWN STRL NON-REIN LRG LVL3 (GOWN DISPOSABLE) ×4 IMPLANT
HANDPIECE INTERPULSE COAX TIP (DISPOSABLE) ×1
HOOD PEEL AWAY FACE SHEILD DIS (HOOD) ×8 IMPLANT
KIT BASIN OR (CUSTOM PROCEDURE TRAY) ×2 IMPLANT
KIT ROOM TURNOVER OR (KITS) ×2 IMPLANT
MANIFOLD NEPTUNE II (INSTRUMENTS) ×2 IMPLANT
NEEDLE 22X1 1/2 (OR ONLY) (NEEDLE) IMPLANT
NS IRRIG 1000ML POUR BTL (IV SOLUTION) ×2 IMPLANT
PACK TOTAL JOINT (CUSTOM PROCEDURE TRAY) ×2 IMPLANT
PAD ARMBOARD 7.5X6 YLW CONV (MISCELLANEOUS) ×4 IMPLANT
PADDING CAST COTTON 6X4 STRL (CAST SUPPLIES) ×2 IMPLANT
SET HNDPC FAN SPRY TIP SCT (DISPOSABLE) ×1 IMPLANT
SPONGE GAUZE 4X4 12PLY (GAUZE/BANDAGES/DRESSINGS) ×2 IMPLANT
STAPLER VISISTAT 35W (STAPLE) ×2 IMPLANT
SUCTION FRAZIER TIP 10 FR DISP (SUCTIONS) ×2 IMPLANT
SUT BONE WAX W31G (SUTURE) ×2 IMPLANT
SUT VIC AB 0 CTB1 27 (SUTURE) ×4 IMPLANT
SUT VIC AB 1 CT1 27 (SUTURE) ×2
SUT VIC AB 1 CT1 27XBRD ANBCTR (SUTURE) ×2 IMPLANT
SUT VIC AB 2-0 CT1 27 (SUTURE) ×2
SUT VIC AB 2-0 CT1 TAPERPNT 27 (SUTURE) ×2 IMPLANT
SYR CONTROL 10ML LL (SYRINGE) IMPLANT
TOWEL OR 17X24 6PK STRL BLUE (TOWEL DISPOSABLE) ×2 IMPLANT
TOWEL OR 17X26 10 PK STRL BLUE (TOWEL DISPOSABLE) ×2 IMPLANT
TRAY FOLEY CATH 14FR (SET/KITS/TRAYS/PACK) ×2 IMPLANT
WATER STERILE IRR 1000ML POUR (IV SOLUTION) ×4 IMPLANT

## 2012-08-13 NOTE — Anesthesia Preprocedure Evaluation (Addendum)
Anesthesia Evaluation  Patient identified by MRN, date of birth, ID band Patient awake    Reviewed: Allergy & Precautions, H&P , NPO status , Patient's Chart, lab work & pertinent test results  History of Anesthesia Complications Negative for: history of anesthetic complications  Airway Mallampati: II TM Distance: >3 FB Neck ROM: Full    Dental  (+) Partial Upper and Dental Advisory Given   Pulmonary former smoker,  breath sounds clear to auscultation        Cardiovascular hypertension, Pt. on medications Rhythm:Regular Rate:Normal     Neuro/Psych negative neurological ROS     GI/Hepatic Neg liver ROS, GERD-  Controlled,  Endo/Other  negative endocrine ROS  Renal/GU negative Renal ROS     Musculoskeletal   Abdominal   Peds  Hematology negative hematology ROS (+)   Anesthesia Other Findings   Reproductive/Obstetrics negative OB ROS                         Anesthesia Physical Anesthesia Plan  ASA: III  Anesthesia Plan: General   Post-op Pain Management:    Induction: Intravenous  Airway Management Planned: Oral ETT  Additional Equipment:   Intra-op Plan:   Post-operative Plan: Extubation in OR  Informed Consent: I have reviewed the patients History and Physical, chart, labs and discussed the procedure including the risks, benefits and alternatives for the proposed anesthesia with the patient or authorized representative who has indicated his/her understanding and acceptance.   Dental advisory given  Plan Discussed with: CRNA, Anesthesiologist and Surgeon  Anesthesia Plan Comments:         Anesthesia Quick Evaluation

## 2012-08-13 NOTE — Progress Notes (Signed)
UR COMPLETED  

## 2012-08-13 NOTE — Op Note (Signed)
TOTAL KNEE REPLACEMENT OPERATIVE NOTE:  08/13/2012  3:49 PM  PATIENT:  Renee Rodriguez  62 y.o. female  PRE-OPERATIVE DIAGNOSIS:  osteoarthritis left knee  POST-OPERATIVE DIAGNOSIS:  osteoarthritis left knee  PROCEDURE:  Procedure(s): TOTAL KNEE ARTHROPLASTY  SURGEON:  Surgeon(s): Dannielle Huh, MD  PHYSICIAN ASSISTANT: Altamese Cabal, [facility]  ANESTHESIA:   general  DRAINS: Hemovac and On-Q Marcaine Pain Pump  SPECIMEN: None  COUNTS:  Correct  TOURNIQUET:   Total Tourniquet Time Documented: Thigh (Left) - 45 minutes Total: Thigh (Left) - 45 minutes   DICTATION:  Indication for procedure:    The patient is a 62 y.o. female who has failed conservative treatment for osteoarthritis left knee.  Informed consent was obtained prior to anesthesia. The risks versus benefits of the operation were explain and in a way the patient can, and did, understand.   Description of procedure:     The patient was taken to the operating room and placed under anesthesia.  The patient was positioned in the usual fashion taking care that all body parts were adequately padded and/or protected.  I foley catheter was not placed.  A tourniquet was applied and the leg prepped and draped in the usual sterile fashion.  The extremity was exsanguinated with the esmarch and tourniquet inflated to 350 mmHg.  Pre-operative range of motion was normal.  The knee was in 6 degree of mild varus.  A midline incision approximately 6-7 inches long was made with a #10 blade.  A new blade was used to make a parapatellar arthrotomy going 2-3 cm into the quadriceps tendon, over the patella, and alongside the medial aspect of the patellar tendon.  A synovectomy was then performed with the #10 blade and forceps. I then elevated the deep MCL off the medial tibial metaphysis subperiosteally around to the semimembranosus attachment.    I everted the patella and used calipers to measure patellar thickness.  I used the reamer to ream  down to appropriate thickness to recreate the native thickness.  I then removed excess bone with the rongeur and sagittal saw.  I used the appropriately sized template and drilled the three lug holes.  I then put the trial in place and measured the thickness with the calipers to ensure recreation of the native thickness.  The trial was then removed and the patella subluxed and the knee brought into flexion.  A homan retractor was place to retract and protect the patella and lateral structures.  A Z-retractor was place medially to protect the medial structures.  The extra-medullary alignment system was used to make cut the tibial articular surface perpendicular to the anamotic axis of the tibia and in 3 degrees of posterior slope.  The cut surface and alignment jig was removed.  I then used the intramedullary alignment guide to make a 6 valgus cut on the distal femur.  I then marked out the epicondylar axis on the distal femur.  The posterior condylar axis measured 3 degrees.  I then used the anterior referencing sizer and measured the femur to be a size D.  The 4-In-1 cutting block was screwed into place in external rotation matching the posterior condylar angle, making our cuts perpendicular to the epicondylar axis.  Anterior, posterior and chamfer cuts were made with the sagittal saw.  The cutting block and cut pieces were removed.  A lamina spreader was placed in 90 degrees of flexion.  The ACL, PCL, menisci, and posterior condylar osteophytes were removed.  A 10 mm spacer blocked  was found to offer good flexion and extension gap balance after minimal in degree releasing.   The scoop retractor was then placed and the femoral finishing block was pinned in place.  The small sagittal saw was used as well as the lug drill to finish the femur.  The block and cut surfaces were removed and the medullary canal hole filled with autograft bone from the cut pieces.  The tibia was delivered forward in deep flexion  and external rotation.  A size 3 tray was selected and pinned into place centered on the medial 1/3 of the tibial tubercle.  The reamer and keel was used to prepare the tibia through the tray.    I then trialed with the size D femur, size 3 tibia, a 10 mm insert and the 32 patella.  I had excellent flexion/extension gap balance, excellent patella tracking.  Flexion was full and beyond 120 degrees; extension was zero.  These components were chosen and the staff opened them to me on the back table while the knee was lavaged copiously and the cement mixed.  I cemented in the components and removed all excess cement.  The polyethylene tibial component was snapped into place and the knee placed in extension while cement was hardening.  The capsule was infilltrated with 20cc of .25% Marcaine with epinephrine.  A hemovac was place in the joint exiting superolaterally.  A pain pump was place superomedially superficial to the arthrotomy.  Once the cement was hard, the tourniquet was let down.  Hemostasis was obtained.  The arthrotomy was closed with figure-8 #1 vicryl sutures.  The deep soft tissues were closed with #0 vicryls and the subcuticular layer closed with a running #2-0 vicryl.  The skin was reapproximated and closed with skin staples.  The wound was dressed with xeroform, 4 x4's, 2 ABD sponges, a single layer of webril and a TED stocking.   The patient was then awakened, extubated, and taken to the recovery room in stable condition.  BLOOD LOSS:  300cc DRAINS: 1 hemovac, 1 pain catheter COMPLICATIONS:  None.  PLAN OF CARE: Admit to inpatient   PATIENT DISPOSITION:  PACU - hemodynamically stable.   Delay start of Pharmacological VTE agent (>24hrs) due to surgical blood loss or risk of bleeding:  not applicable  Please fax a copy of this op note to my office at [phone] (please only include page 1 and 2 of the Case Information op note)

## 2012-08-13 NOTE — Preoperative (Signed)
Beta Blockers   Reason not to administer Beta Blockers:Not Applicable 

## 2012-08-13 NOTE — H&P (Signed)
Renee Rodriguez MRN:  [id number] DOB/SEX:  [birth year]/female  CHIEF COMPLAINT:  Painful left Knee  HISTORY: Patient is a 62 y.o. female presented with a history of pain in the left knee. Onset of symptoms was gradual starting several years ago with gradually worsening course since that time. Prior procedures on the knee include none. Patient has been treated conservatively with over-the-counter NSAIDs and activity modification. Patient currently rates pain in the knee at 8 out of 10 with activity. There is pain at night.  PAST MEDICAL HISTORY: Patient Active Problem List   Diagnosis Date Noted  . Knee pain, left 10/19/2011  . HTN (hypertension) 10/19/2011   Past Medical History  Diagnosis Date  . Hypertension   . Arthritis   . GERD (gastroesophageal reflux disease)   . Anemia    Past Surgical History  Procedure Laterality Date  . Cesarean section       MEDICATIONS:   Prescriptions prior to admission  Medication Sig Dispense Refill  . hydrochlorothiazide (HYDRODIURIL) 25 MG tablet Take 1 tablet (25 mg total) by mouth daily.  30 tablet  5  . nabumetone (RELAFEN) 500 MG tablet Take 1 tablet (500 mg total) by mouth 2 (two) times daily. Take tablet with meal.  60 tablet  3    ALLERGIES:   Allergies  Allergen Reactions  . Shellfish Allergy Anaphylaxis    REVIEW OF SYSTEMS:  Pertinent items are noted in HPI.   FAMILY HISTORY:   Family History  Problem Relation Age of Onset  . Stroke Mother     Cerebral hemorrhage  . Asthma Mother   . Alcohol abuse Mother   . Kidney disease Mother   . Hypertension Mother   . Hypertension Sister   . Kidney disease Daughter     SOCIAL HISTORY:   History  Substance Use Topics  . Smoking status: Former Smoker    Quit date: 02/29/1996  . Smokeless tobacco: Never Used  . Alcohol Use: 1.5 oz/week    3 drink(s) per week     Comment: occasionally     EXAMINATION:  Vital signs in last 24 hours: Temp:  [98.1 F (36.7 C)] 98.1 F  (36.7 C) (06/16 0610) Pulse Rate:  [66] 66 (06/16 0610) Resp:  [18] 18 (06/16 0610) BP: (176)/(95) 176/95 mmHg (06/16 0610) SpO2:  [98 %] 98 % (06/16 0610)  General appearance: alert, cooperative and no distress Lungs: clear to auscultation bilaterally Heart: regular rate and rhythm, S1, S2 normal, no murmur, click, rub or gallop Abdomen: soft, non-tender; bowel sounds normal; no masses,  no organomegaly Extremities: extremities normal, atraumatic, no cyanosis or edema and Homans sign is negative, no sign of DVT Pulses: 2+ and symmetric Skin: Skin color, texture, turgor normal. No rashes or lesions Neurologic: Alert and oriented X 3, normal strength and tone. Normal symmetric reflexes. Normal coordination and gait  Musculoskeletal:  ROM 0-115, Ligaments intact,  Imaging Review Plain radiographs demonstrate severe degenerative joint disease of the left knee. The overall alignment is mild valgus. The bone quality appears to be good for age and reported activity level.  Assessment/Plan: End stage arthritis, left knee   The patient history, physical examination and imaging studies are consistent with advanced degenerative joint disease of the left knee. The patient has failed conservative treatment.  The clearance notes were reviewed.  After discussion with the patient it was felt that Total Knee Replacement was indicated. The procedure,  risks, and benefits of total knee arthroplasty were presented and reviewed. The risks  including but not limited to aseptic loosening, infection, blood clots, vascular injury, stiffness, patella tracking problems complications among others were discussed. The patient acknowledged the explanation, agreed to proceed with the plan.  Imaad Reuss 08/13/2012, 6:41 AM

## 2012-08-13 NOTE — Anesthesia Postprocedure Evaluation (Signed)
  Anesthesia Post-op Note  Patient: Renee Rodriguez  Procedure(s) Performed: Procedure(s): TOTAL KNEE ARTHROPLASTY (Left)  Patient Location: PACU  Anesthesia Type:General  Level of Consciousness: awake  Airway and Oxygen Therapy: Patient Spontanous Breathing  Post-op Pain: mild  Post-op Assessment: Post-op Vital signs reviewed  Post-op Vital Signs: Reviewed  Complications: No apparent anesthesia complications

## 2012-08-13 NOTE — Transfer of Care (Signed)
Immediate Anesthesia Transfer of Care Note  Patient: Renee Rodriguez  Procedure(s) Performed: Procedure(s): TOTAL KNEE ARTHROPLASTY (Left)  Patient Location: PACU  Anesthesia Type:General  Level of Consciousness: oriented and patient cooperative  Airway & Oxygen Therapy: Patient Spontanous Breathing and Patient connected to nasal cannula oxygen  Post-op Assessment: Report given to PACU RN and Post -op Vital signs reviewed and stable  Post vital signs: Reviewed and stable  Complications: No apparent anesthesia complications

## 2012-08-13 NOTE — Evaluation (Signed)
Physical Therapy Evaluation Patient Details Name: Renee Rodriguez MRN: [id number] DOB: [birth year] Today's Date: 08/13/2012 Time: 6962-9528 PT Time Calculation (min): 17 min  PT Assessment / Plan / Recommendation Clinical Impression  Patient is a 62 yo female s/p Lt TKA.  Will benefit from acute PT to maximize independence prior to discharge with family.  Recommend HHPT for continued therapy.    PT Assessment  Patient needs continued PT services    Follow Up Recommendations  Home health PT;Supervision/Assistance - 24 hour    Does the patient have the potential to tolerate intense rehabilitation      Barriers to Discharge Inaccessible home environment 15 steps to get to apartment    Equipment Recommendations  Rolling walker with 5" wheels    Recommendations for Other Services     Frequency 7X/week    Precautions / Restrictions Precautions Precautions: Knee Precaution Booklet Issued: Yes (comment) Precaution Comments: Reviewed precautions with patient Restrictions Weight Bearing Restrictions: Yes LLE Weight Bearing: Weight bearing as tolerated   Pertinent Vitals/Pain       Mobility  Bed Mobility Bed Mobility: Supine to Sit;Sitting - Scoot to Edge of Bed Supine to Sit: 4: Min assist;HOB flat Sitting - Scoot to [place] of Bed: 4: Min guard Details for Bed Mobility Assistance: Verbal cues for technique.  Assist for LLE.  Patient sat at EOB x 5 minutes with good balance. Transfers Transfers: Sit to Stand;Stand to [employer] Transfers Sit to Stand: 4: Min assist;With upper extremity assist;From bed Stand to Sit: 4: Min assist;With upper extremity assist;With armrests;To chair/3-in-1 Stand Pivot Transfers: 4: Min assist Details for Transfer Assistance: Verbal cues for hand placement and technique.  Assist for balance.  Patient able to take several steps to pivot to chair. Ambulation/Gait Ambulation/Gait Assistance: Not tested (comment)    Exercises Total Joint  Exercises Ankle Circles/Pumps: AROM;Both;10 reps;Seated   PT Diagnosis: Difficulty walking;Acute pain  PT Problem List: Decreased strength;Decreased range of motion;Decreased activity tolerance;Decreased balance;Decreased mobility;Decreased knowledge of use of DME;Decreased knowledge of precautions;Pain PT Treatment Interventions: DME instruction;Gait training;Stair training;Functional mobility training;Therapeutic exercise;Patient/family education   PT Goals Acute Rehab PT Goals PT Goal Formulation: With patient Time For Goal Achievement: 08/20/12 Potential to Achieve Goals: Good Pt will go Supine/Side to Sit: Independently;with HOB 0 degrees PT Goal: Supine/Side to Sit - Progress: Goal set today Pt will go Sit to Supine/Side: Independently;with HOB 0 degrees PT Goal: Sit to Supine/Side - Progress: Goal set today Pt will go Sit to Stand: with supervision;with upper extremity assist PT Goal: Sit to Stand - Progress: Goal set today Pt will Ambulate: 51 - 150 feet;with supervision;with rolling walker PT Goal: Ambulate - Progress: Goal set today Pt will Go Up / Down Stairs: Flight;with min assist;with rail(s);with least restrictive assistive device PT Goal: Up/Down Stairs - Progress: Goal set today Pt will Perform Home Exercise Program: with supervision, verbal cues required/provided PT Goal: Perform Home Exercise Program - Progress: Goal set today  Visit Information  Last PT Received On: 08/13/12 Assistance Needed: +1    Subjective Data  Subjective: "I'm just sleepy" Patient Stated Goal: To go home   Prior Functioning  Home Living Lives With: Family (Sister and granddaughter) Available Help at Discharge: Family;Available 24 hours/day Type of Home: Apartment Home Access: Stairs to enter Entrance Stairs-Number of Steps: 15 Entrance Stairs-Rails: Right;Left Home Layout: One level Home Adaptive Equipment: None Prior Function Level of Independence: Independent Able to Take  Stairs?: Yes Driving: Yes Vocation: Retired Musician: No difficulties  Cognition  Cognition Arousal/Alertness: Lethargic Behavior During Therapy: WFL for tasks assessed/performed Overall Cognitive Status: Within Functional Limits for tasks assessed    Extremity/Trunk Assessment Right Upper Extremity Assessment RUE ROM/Strength/Tone: WFL for tasks assessed RUE Sensation: WFL - Light Touch Left Upper Extremity Assessment LUE ROM/Strength/Tone: WFL for tasks assessed LUE Sensation: WFL - Light Touch Right Lower Extremity Assessment RLE ROM/Strength/Tone: WFL for tasks assessed RLE Sensation: WFL - Light Touch Left Lower Extremity Assessment LLE ROM/Strength/Tone: Deficits;Unable to fully assess;Due to pain LLE ROM/Strength/Tone Deficits: Able to assist moving LLE off of bed Trunk Assessment Trunk Assessment: Normal   Balance Balance Balance Assessed: Yes Static Sitting Balance Static Sitting - Balance Support: No upper extremity supported;Feet supported Static Sitting - Level of Assistance: 5: Stand by assistance Static Sitting - Comment/# of Minutes: 5  End of Session PT - End of Session Equipment Utilized During Treatment: Gait belt Activity Tolerance: Patient limited by pain;Patient limited by fatigue Patient left: in chair;with call bell/phone within reach;with family/visitor present Nurse Communication: Mobility status CPM Left Knee CPM Left Knee: Off  GP     Vena Austria 08/13/2012, 3:33 PM Durenda Hurt. Renaldo Fiddler, [facility] Acute Rehab Services Pager [phone]

## 2012-08-13 NOTE — Care Management Note (Signed)
CARE MANAGEMENT NOTE 08/13/2012  Patient:  Renee Rodriguez, Renee Rodriguez   Account Number:  [id number]  Date Initiated:  08/13/2012  Documentation initiated by:  Vance Peper  Subjective/Objective Assessment:   62 yr old female s/p left total knee arthroplasty     Action/Plan:   Patient preoperatively setup with [facility]   Anticipated DC Date:  08/15/2012   Anticipated DC Plan:  HOME W HOME HEALTH SERVICES      DC Planning Services  CM consult      Choice offered to / List presented to:             Status of service:  In process, will continue to follow Medicare Important Message given?   (If response is "NO", the following Medicare IM given date fields will be blank) Date Medicare IM given:   Date Additional Medicare IM given:    Discharge Disposition:    Per UR Regulation:    If discussed at Long Length of Stay Meetings, dates discussed:    Comments:

## 2012-08-13 NOTE — Anesthesia Procedure Notes (Signed)
Procedure Name: Intubation Date/Time: 08/13/2012 7:34 AM Performed by: Lovie Chol Pre-anesthesia Checklist: Patient identified, Emergency Drugs available, Suction available, Patient being monitored and Timeout performed Patient Re-evaluated:Patient Re-evaluated prior to inductionOxygen Delivery Method: Circle system utilized Preoxygenation: Pre-oxygenation with 100% oxygen Intubation Type: IV induction Ventilation: Mask ventilation without difficulty Laryngoscope Size: [place] and 2 Grade View: Grade I Tube type: Oral Tube size: 7.0 mm Number of attempts: 1 Airway Equipment and Method: Stylet and LTA kit utilized Placement Confirmation: ETT inserted through vocal cords under direct vision,  positive ETCO2,  CO2 detector and breath sounds checked- equal and bilateral

## 2012-08-13 NOTE — Progress Notes (Signed)
Received report from Maria RN.

## 2012-08-13 NOTE — Plan of Care (Signed)
Problem: Consults Goal: Diagnosis- Total Joint Replacement Primary Total Knee     

## 2012-08-13 NOTE — Progress Notes (Signed)
Orthopedic Tech Progress Note Patient Details:  Renee Rodriguez 20-Nov-1950 [id number] Applied CPM to LLE.  Left Footsie Roll with pt.'s nurse. CPM Left Knee CPM Left Knee: On Left Knee Flexion (Degrees): 90 Left Knee Extension (Degrees): 0   Lesle Chris 08/13/2012, 10:36 AM

## 2012-08-14 ENCOUNTER — Encounter (HOSPITAL_COMMUNITY): Payer: Self-pay | Admitting: Orthopedic Surgery

## 2012-08-14 LAB — CBC
HCT: 37.8 % (ref 36.0–46.0)
MCV: 87.3 fL (ref 78.0–100.0)
RBC: 4.33 MIL/uL (ref 3.87–5.11)
WBC: 9.3 10*3/uL (ref 4.0–10.5)

## 2012-08-14 LAB — BASIC METABOLIC PANEL
BUN: 5 mg/dL — ABNORMAL LOW (ref 6–23)
CO2: 29 mEq/L (ref 19–32)
Chloride: 98 mEq/L (ref 96–112)
Creatinine, Ser: 0.44 mg/dL — ABNORMAL LOW (ref 0.50–1.10)

## 2012-08-14 MED ORDER — OXYCODONE HCL 5 MG PO TABS: 5.0000 mg | ORAL_TABLET | ORAL | Status: DC | PRN

## 2012-08-14 MED ORDER — ENOXAPARIN SODIUM 40 MG/0.4ML ~~LOC~~ SOLN: 40.0000 mg | SUBCUTANEOUS | Status: DC

## 2012-08-14 MED ORDER — METHOCARBAMOL 500 MG PO TABS: 500.0000 mg | ORAL_TABLET | Freq: Four times a day (QID) | ORAL | Status: DC | PRN

## 2012-08-14 MED ORDER — OXYCODONE HCL ER 10 MG PO T12A: 10.0000 mg | EXTENDED_RELEASE_TABLET | Freq: Two times a day (BID) | ORAL | Status: DC

## 2012-08-14 NOTE — Progress Notes (Signed)
Physical Therapy Treatment Patient Details Name: Renee Rodriguez MRN: [id number] DOB: 06-Aug-1950 Today's Date: 08/14/2012 Time: 7829-5621 PT Time Calculation (min): 26 min  PT Assessment / Plan / Recommendation Comments on Treatment Session  Patient progressing well with ambulation. Will attempt stairs this afternoon.     Follow Up Recommendations  Home health PT;Supervision/Assistance - 24 hour     Does the patient have the potential to tolerate intense rehabilitation     Barriers to Discharge        Equipment Recommendations  Rolling walker with 5" wheels    Recommendations for Other Services    Frequency 7X/week   Plan Discharge plan remains appropriate;Frequency remains appropriate    Precautions / Restrictions Precautions Precautions: Knee Precaution Comments: Reviewed precautions with patient Restrictions LLE Weight Bearing: Weight bearing as tolerated   Pertinent Vitals/Pain Patient with comlpaints of knee pain. Rn aware    Mobility  Bed Mobility Supine to Sit: 4: Min guard;HOB flat;With rails Details for Bed Mobility Assistance: Verbal cues for technique.   Transfers Sit to Stand: With upper extremity assist;From bed;4: Min guard Stand to Sit: With upper extremity assist;With armrests;To chair/3-in-1;4: Min guard Details for Transfer Assistance: Verbal cues for hand placement and technique Ambulation/Gait Ambulation/Gait Assistance: 4: Min guard Ambulation Distance (Feet): 150 Feet Assistive device: Rolling walker Ambulation/Gait Assistance Details: Cues for RW management and gait sequence Gait Pattern: Step-to pattern    Exercises Total Joint Exercises Ankle Circles/Pumps: AROM;Both;10 reps;Seated Quad Sets: AROM;Left;10 reps Heel Slides: AAROM;Left;10 reps Hip ABduction/ADduction: AAROM;Left Straight Leg Raises: AAROM;Left;10 reps   PT Diagnosis:    PT Problem List:   PT Treatment Interventions:     PT Goals Acute Rehab PT Goals PT Goal:  Supine/Side to Sit - Progress: Progressing toward goal PT Goal: Sit to Stand - Progress: Progressing toward goal PT Goal: Ambulate - Progress: Progressing toward goal PT Goal: Perform Home Exercise Program - Progress: Progressing toward goal  Visit Information  Last PT Received On: 08/14/12 Assistance Needed: +1    Subjective Data      Cognition  Cognition Arousal/Alertness: Awake/alert Behavior During Therapy: WFL for tasks assessed/performed Overall Cognitive Status: Within Functional Limits for tasks assessed    Balance     End of Session PT - End of Session Equipment Utilized During Treatment: Gait belt Activity Tolerance: Patient tolerated treatment well Patient left: in chair;with call bell/phone within reach   GP     Fredrich Birks 08/14/2012, 9:39 AM 08/14/2012 Fredrich Birks PTA [phone] pager [phone] office

## 2012-08-14 NOTE — Progress Notes (Signed)
Physical Therapy Treatment Patient Details Name: Renee Rodriguez MRN: [id number] DOB: 09-01-1950 Today's Date: 08/14/2012 Time: 9562-1308 PT Time Calculation (min): 27 min  PT Assessment / Plan / Recommendation Comments on Treatment Session  Patient able to tolerate stair training and increased ambulation this afternoon    Follow Up Recommendations  Home health PT;Supervision/Assistance - 24 hour     Does the patient have the potential to tolerate intense rehabilitation     Barriers to Discharge        Equipment Recommendations  Rolling walker with 5" wheels    Recommendations for Other Services    Frequency 7X/week   Plan Discharge plan remains appropriate;Frequency remains appropriate    Precautions / Restrictions Precautions Precautions: Knee Precaution Booklet Issued: No Precaution Comments: Reviewed precautions with patient Restrictions Weight Bearing Restrictions: Yes LLE Weight Bearing: Weight bearing as tolerated   Pertinent Vitals/Pain     Mobility  Bed Mobility Bed Mobility: Sit to Supine Supine to Sit: 5: Supervision Sitting - Scoot to Edge of Bed: 5: Supervision Sit to Supine: 4: Min guard;HOB flat Details for Bed Mobility Assistance: Minguard and cues for precautions.  Transfers Sit to Stand: 5: Supervision;From bed;From chair/3-in-1 Stand to Sit: 5: Supervision;To chair/3-in-1 Details for Transfer Assistance: vc's for hand placement Ambulation/Gait Ambulation/Gait Assistance: 5: Supervision Ambulation Distance (Feet): 200 Feet Assistive device: Rolling walker Ambulation/Gait Assistance Details: Cues for posture. Adjusted height on walker and patient felt better after that Gait Pattern: Step-to pattern;Step-through pattern;Decreased stride length Gait velocity: decreased Stairs: Yes Stairs Assistance: 4: Min guard Stair Management Technique: Two rails;Forwards Number of Stairs: 10    Exercises Total Joint Exercises Quad Sets: AROM;Left;10  reps Heel Slides: AAROM;Left;10 reps Hip ABduction/ADduction: AAROM;Left;10 reps Straight Leg Raises: AAROM;Left;10 reps   PT Diagnosis:    PT Problem List:   PT Treatment Interventions:     PT Goals Acute Rehab PT Goals PT Goal: Supine/Side to Sit - Progress: Progressing toward goal PT Goal: Sit to Stand - Progress: Progressing toward goal PT Goal: Ambulate - Progress: Progressing toward goal PT Goal: Up/Down Stairs - Progress: Progressing toward goal PT Goal: Perform Home Exercise Program - Progress: Progressing toward goal  Visit Information  Last PT Received On: 08/14/12 Assistance Needed: +1    Subjective Data      Cognition  Cognition Arousal/Alertness: Awake/alert Behavior During Therapy: WFL for tasks assessed/performed Overall Cognitive Status: Within Functional Limits for tasks assessed    Balance     End of Session PT - End of Session Equipment Utilized During Treatment: Gait belt Activity Tolerance: Patient tolerated treatment well Patient left: in chair;with call bell/phone within reach Nurse Communication: Mobility status   GP     Fredrich Birks 08/14/2012, 2:22 PM  08/14/2012 Fredrich Birks PTA [phone] pager [phone] office

## 2012-08-14 NOTE — Progress Notes (Signed)
SPORTS MEDICINE AND JOINT REPLACEMENT  Georgena Spurling, MD   Altamese Cabal, PA-C [address] [place], [place], [place]  [address]                             [phone]   PROGRESS NOTE  Subjective:  negative for Chest Pain  negative for Shortness of Breath  negative for Nausea/Vomiting   negative for Calf Pain  negative for Bowel Movement   Tolerating Diet: yes         Patient reports pain as 5 on 0-10 scale.    Objective: Vital signs in last 24 hours:   Patient Vitals for the past 24 hrs:  BP Temp Pulse Resp SpO2  08/14/12 0800 - - - 18 -  08/14/12 0459 140/68 mmHg 98.6 F (37 C) 81 14 98 %  08/14/12 0400 - - - 16 -  08/14/12 0150 147/84 mmHg 99.1 F (37.3 C) 68 16 100 %  08/14/12 0000 - - - 16 -  08/13/12 2106 151/85 mmHg 98.1 F (36.7 C) 69 16 100 %  08/13/12 2000 - - - 16 -  08/13/12 1103 - - - - 100 %  08/13/12 1055 160/86 mmHg 98.2 F (36.8 C) 67 16 100 %  08/13/12 1035 - 97.2 F (36.2 C) - - -  08/13/12 1034 - - 63 10 100 %  08/13/12 1030 - - 66 13 100 %  08/13/12 1028 149/85 mmHg - 62 14 100 %  08/13/12 1020 - - 63 14 100 %  08/13/12 1015 151/65 mmHg - 72 19 100 %  08/13/12 1000 - - 60 15 100 %  08/13/12 0958 162/95 mmHg - 56 11 99 %  08/13/12 0945 182/97 mmHg - 60 17 -  08/13/12 0943 - - 58 12 100 %  08/13/12 0935 - - 56 17 -  08/13/12 0930 187/98 mmHg - 58 16 -  08/13/12 0928 - - 56 19 98 %    @flow {1959:LAST@   Intake/Output from previous day:   06/16 0701 - 06/17 0700 In: 2383.8 [I.V.:2283.8] Out: 2575 [Urine:2200; Drains:375]   Intake/Output this shift:   06/17 0701 - 06/17 1900 In: -  Out: 75 [Drains:75]   Intake/Output     06/16 0701 - 06/17 0700 06/17 0701 - 06/18 0700   I.V. 2283.8    IV Piggyback 100    Total Intake 2383.8     Urine 2200 0   Drains 375 75   Total Output 2575 75   Net -191.3 -75        Urine Occurrence 2 x 1 x      LABORATORY DATA:  Recent Labs  08/08/12 1030 08/13/12 1130 08/14/12 0442  WBC 7.8 13.5*  9.3  HGB 14.8 13.2 13.2  HCT 43.3 38.4 37.8  PLT 383 362 336    Recent Labs  08/08/12 1030 08/13/12 1130 08/14/12 0442  NA 140  --  137  K 4.2  --  3.2*  CL 101  --  98  CO2 31  --  29  BUN 12  --  5*  CREATININE 0.50 0.45* 0.44*  GLUCOSE 90  --  114*  CALCIUM 9.4  --  8.9   Lab Results  Component Value Date   INR 0.99 08/08/2012    Examination:  General appearance: alert, cooperative and no distress Extremities: Homans sign is negative, no sign of DVT  Wound Exam: clean, dry, intact  Drainage:  None: wound tissue dry  Motor Exam: EHL and FHL Intact  Sensory Exam: Deep Peroneal normal   Assessment:    1 Day Post-Op  Procedure(s) (LRB): TOTAL KNEE ARTHROPLASTY (Left)  ADDITIONAL DIAGNOSIS:  Active Problems:   * No active hospital problems. *  Acute Blood Loss Anemia   Plan: Physical Therapy as ordered Weight Bearing as Tolerated (WBAT)  DVT Prophylaxis:  Lovenox  DISCHARGE PLAN: Home  DISCHARGE NEEDS: HHPT, CPM, Walker and 3-in-1 comode seat         Pamelyn Bancroft 08/14/2012, 9:16 AM

## 2012-08-14 NOTE — Evaluation (Signed)
Occupational Therapy Evaluation Patient Details Name: Shikha Bibb MRN: [id number] DOB: 1950/07/16 Today's Date: 08/14/2012 Time: 4098-1191 OT Time Calculation (min): 19 min  OT Assessment / Plan / Recommendation Clinical Impression    Patient is a 62 yo female s/p Lt TKA. Will benefit from acute OT to maximize independence prior to discharge with family.      OT Assessment  Patient needs continued OT Services    Follow Up Recommendations  No OT follow up;Supervision/Assistance - 24 hour    Barriers to Discharge      Equipment Recommendations  3 in 1 bedside comode;Other (comment) (tub equipment tbd)    Recommendations for Other Services    Frequency  Min 2X/week    Precautions / Restrictions Precautions Precautions: Knee Precaution Booklet Issued: No Precaution Comments: Reviewed precautions with patient Restrictions Weight Bearing Restrictions: Yes LLE Weight Bearing: Weight bearing as tolerated   Pertinent Vitals/Pain No pain reported. Pt had recently received pain meds. Pt became nauseous and vomited during session. Nurse notified.     ADL  Eating/Feeding: Independent Where Assessed - Eating/Feeding: Chair Grooming: Teeth care;Set up Where Assessed - Grooming: Supported sitting Upper Body Bathing: Set up Where Assessed - Upper Body Bathing: Supported sitting Lower Body Bathing: Minimal assistance Where Assessed - Lower Body Bathing: Supported sit to stand Upper Body Dressing: Set up Where Assessed - Upper Body Dressing: Supported sitting Lower Body Dressing: Minimal assistance Where Assessed - Lower Body Dressing: Supported sit to Pharmacist, hospital: Counsellor Method: Sit to Barista: Other (comment) Administrator, Civil Service) Tub/Shower Transfer Method: Not assessed Equipment Used: Gait belt;Rolling walker Transfers/Ambulation Related to ADLs: Minguard ADL Comments: Pt at overall Min A for LB ADLs. Pt became  nauseous during session.    OT Diagnosis: Acute pain  OT Problem List: Decreased strength;Decreased range of motion;Decreased activity tolerance;Impaired balance (sitting and/or standing);Decreased knowledge of precautions;Decreased knowledge of use of DME or AE;Pain OT Treatment Interventions: Self-care/ADL training;DME and/or AE instruction;Therapeutic activities;Patient/family education;Balance training   OT Goals Acute Rehab OT Goals OT Goal Formulation: With patient Time For Goal Achievement: 08/21/12 Potential to Achieve Goals: Good ADL Goals Pt Will Perform Lower Body Bathing: with modified independence;Sit to stand from chair ADL Goal: Lower Body Bathing - Progress: Goal set today Pt Will Perform Lower Body Dressing: with modified independence;Sit to stand from chair;Sit to stand from bed ADL Goal: Lower Body Dressing - Progress: Goal set today Pt Will Transfer to Toilet: with modified independence;with DME;Ambulation ADL Goal: Toilet Transfer - Progress: Goal set today Pt Will Perform Toileting - Clothing Manipulation: with modified independence;Standing ADL Goal: Toileting - Clothing Manipulation - Progress: Goal set today Pt Will Perform Toileting - Hygiene: with modified independence;Sit to stand from 3-in-1/toilet;Sitting on 3-in-1 or toilet ADL Goal: Toileting - Hygiene - Progress: Goal set today Pt Will Perform Tub/Shower Transfer: Tub transfer;with supervision;Ambulation;with DME ADL Goal: Tub/Shower Transfer - Progress: Goal set today  Visit Information  Last OT Received On: 08/14/12 Assistance Needed: +1    Subjective Data      Prior Functioning     Home Living Lives With: Family (sister and granddaughter) Available Help at Discharge: Family;Available 24 hours/day Type of Home: Apartment Home Access: Stairs to enter Entrance Stairs-Number of Steps: 15 Entrance Stairs-Rails: Right;Left Home Layout: One level Bathroom Shower/Tub: Agricultural engineer: Standard Home Adaptive Equipment: None Prior Function Level of Independence: Independent Able to Take Stairs?: Yes Driving: Yes Vocation: Retired Musician: No difficulties  Vision/Perception     Cognition  Cognition Arousal/Alertness: Awake/alert Behavior During Therapy: WFL for tasks assessed/performed Overall Cognitive Status: Within Functional Limits for tasks assessed    Extremity/Trunk Assessment Right Upper Extremity Assessment RUE ROM/Strength/Tone: WFL for tasks assessed Left Upper Extremity Assessment LUE ROM/Strength/Tone: WFL for tasks assessed     Mobility Bed Mobility Bed Mobility: Sit to Supine Sit to Supine: 4: Min guard;HOB flat Details for Bed Mobility Assistance: Minguard and cues for precautions .  Transfers Transfers: Sit to Stand;Stand to Sit Sit to Stand: 4: Min guard;With upper extremity assist;With armrests;From chair/3-in-1 Stand to Sit: 4: Min guard;With upper extremity assist;To bed;To chair/3-in-1 Details for Transfer Assistance: vc's for hand placement        Balance     End of Session OT - End of Session Equipment Utilized During Treatment: Gait belt Activity Tolerance: Patient tolerated treatment well;Other (comment) (vomited during session; nauseaus) Patient left: in bed;with call bell/phone within reach Nurse Communication: Other (comment) (pt vomited)  GO     Earlie Raveling OTR/L [phone] 08/14/2012, 12:46 PM

## 2012-08-14 NOTE — Care Management Note (Signed)
CARE MANAGEMENT NOTE 08/14/2012  Patient:  Renee Rodriguez, Renee Rodriguez   Account Number:  [id number]  Date Initiated:  08/13/2012  Documentation initiated by:  Vance Peper  Subjective/Objective Assessment:   62 yr old female s/p left total knee arthroplasty     Action/Plan:   CM spoke with patient concerning home health and DME needs at discharge, Patient preoperatively setup with [facility], no changes. DME to be delivered.   Anticipated DC Date:  08/15/2012   Anticipated DC Plan:  HOME W HOME HEALTH SERVICES      DC Planning Services  CM consult      [facility] Choice  HOME HEALTH  DURABLE MEDICAL EQUIPMENT   Choice offered to / List presented to:  C-1 Patient   DME arranged  3-N-1  WALKER - ROLLING  CPM      DME agency  [facility]     HH arranged  HH-2 PT      [facility] agency  [facility]   Status of service:  Completed, signed off Medicare Important Message given?   (If response is "NO", the following Medicare IM given date fields will be blank) Date Medicare IM given:   Date Additional Medicare IM given:    Discharge Disposition:  HOME W HOME HEALTH SERVICES  Per UR Regulation:    If discussed at Long Length of Stay Meetings, dates discussed:    Comments:

## 2012-08-15 LAB — CBC
HCT: 38.9 % (ref 36.0–46.0)
MCHC: 35 g/dL (ref 30.0–36.0)
MCV: 87 fL (ref 78.0–100.0)
RDW: 13.9 % (ref 11.5–15.5)
WBC: 9.7 10*3/uL (ref 4.0–10.5)

## 2012-08-15 LAB — BASIC METABOLIC PANEL
BUN: 6 mg/dL (ref 6–23)
Chloride: 94 mEq/L — ABNORMAL LOW (ref 96–112)
Creatinine, Ser: 0.4 mg/dL — ABNORMAL LOW (ref 0.50–1.10)
GFR calc Af Amer: >90 mL/min (ref >90)

## 2012-08-15 NOTE — Progress Notes (Signed)
Lovenox teaching done with pt. Pt verbalized understanding of how to admin injection. D/C instructions and scripts given to pt.

## 2012-08-15 NOTE — Progress Notes (Signed)
Physical Therapy Treatment Patient Details Name: Renee Rodriguez MRN: [id number] DOB: 27-Dec-1950 Today's Date: 08/15/2012 Time: 4098-1191 PT Time Calculation (min): 24 min  PT Assessment / Plan / Recommendation Comments on Treatment Session  Patient progressing well with ambulation and stair training. Anticipate DC later today    Follow Up Recommendations  Home health PT;Supervision/Assistance - 24 hour     Does the patient have the potential to tolerate intense rehabilitation     Barriers to Discharge        Equipment Recommendations  Rolling walker with 5" wheels    Recommendations for Other Services    Frequency 7X/week   Plan Discharge plan remains appropriate;Frequency remains appropriate    Precautions / Restrictions Precautions Precautions: Knee Restrictions Weight Bearing Restrictions: Yes LLE Weight Bearing: Weight bearing as tolerated   Pertinent Vitals/Pain 3/10 knee pain. RN aware    Mobility  Bed Mobility Supine to Sit: 6: Modified independent (Device/Increase time) Sitting - Scoot to Edge of Bed: 6: Modified independent (Device/Increase time) Transfers Sit to Stand: 6: Modified independent (Device/Increase time) Stand to Sit: 6: Modified independent (Device/Increase time) Ambulation/Gait Ambulation/Gait Assistance: 5: Supervision Ambulation Distance (Feet): 300 Feet Assistive device: Rolling walker Ambulation/Gait Assistance Details: Cues for RW positioning.  Gait Pattern: Step-through pattern;Decreased stride length Stairs: Yes Stairs Assistance: 4: Min guard Stair Management Technique: Step to pattern;Forwards;One rail Left Number of Stairs: 12    Exercises Total Joint Exercises Quad Sets: AROM;Left;10 reps Short Arc QuadBarbaraann Boys;Left;10 reps Heel Slides: AAROM;Left;10 reps Hip ABduction/ADduction: Left;10 reps;AROM Straight Leg Raises: AAROM;Left;10 reps   PT Diagnosis:    PT Problem List:   PT Treatment Interventions:     PT Goals Acute  Rehab PT Goals PT Goal: Supine/Side to Sit - Progress: Progressing toward goal PT Goal: Sit to Stand - Progress: Met PT Goal: Ambulate - Progress: Met PT Goal: Up/Down Stairs - Progress: Met PT Goal: Perform Home Exercise Program - Progress: Progressing toward goal  Visit Information  Last PT Received On: 08/15/12 Assistance Needed: +1    Subjective Data      Cognition  Cognition Arousal/Alertness: Awake/alert Behavior During Therapy: WFL for tasks assessed/performed Overall Cognitive Status: Within Functional Limits for tasks assessed    Balance     End of Session PT - End of Session Equipment Utilized During Treatment: Gait belt Activity Tolerance: Patient tolerated treatment well Patient left: in chair;with call bell/phone within reach CPM Left Knee CPM Left Knee: On   GP     Fredrich Birks 08/15/2012, 8:51 AM 08/15/2012 Fredrich Birks PTA [phone] pager [phone] office

## 2012-08-15 NOTE — Discharge Summary (Signed)
SPORTS MEDICINE & JOINT REPLACEMENT   Georgena Spurling, MD   Altamese Cabal, PA-C [address] [place], [place], [place]  [address]                             [phone]  PATIENT ID: Renee Rodriguez        MRN:  [id number]          DOB/AGE: 1950/09/18 / 62 y.o.    DISCHARGE SUMMARY  ADMISSION DATE:    08/13/2012 DISCHARGE DATE:   08/15/2012   ADMISSION DIAGNOSIS: osteoarthritis left knee    DISCHARGE DIAGNOSIS:  osteoarthritis left knee    ADDITIONAL DIAGNOSIS: Active Problems:   * No active hospital problems. *  Past Medical History  Diagnosis Date  . Hypertension   . Arthritis   . GERD (gastroesophageal reflux disease)   . Anemia     PROCEDURE: Procedure(s): TOTAL KNEE ARTHROPLASTY on 08/13/2012  CONSULTS:     HISTORY:  See H&P in chart  HOSPITAL COURSE:  Renee Rodriguez is a 62 y.o. admitted on 08/13/2012 and found to have a diagnosis of osteoarthritis left knee.  After appropriate laboratory studies were obtained  they were taken to the operating room on 08/13/2012 and underwent Procedure(s): TOTAL KNEE ARTHROPLASTY.   They were given perioperative antibiotics:  Anti-infectives   Start     Dose/Rate Route Frequency Ordered Stop   08/13/12 1300  ceFAZolin (ANCEF) IVPB 1 g/50 mL premix     1 g 100 mL/hr over 30 Minutes Intravenous Every 6 hours 08/13/12 1103 08/13/12 1934   08/13/12 0600  ceFAZolin (ANCEF) IVPB 2 g/50 mL premix     2 g 100 mL/hr over 30 Minutes Intravenous On call to O.R. 08/12/12 1323 08/13/12 0730   08/13/12 0553  ceFAZolin (ANCEF) 2-3 GM-% IVPB SOLR    Comments:  CRABTREE, TERESA: cabinet override      08/13/12 0553 08/13/12 1759    .  Tolerated the procedure well.  Placed with a foley intraoperatively.  Given Ofirmev at induction and for 48 hours.    POD# 1: Vital signs were stable.  Patient denied Chest pain, shortness of breath, or calf pain.  Patient was started on Lovenox 30 mg subcutaneously twice daily at 8am.  Consults to PT, OT, and care  management were made.  The patient was weight bearing as tolerated.  CPM was placed on the operative leg 0-90 degrees for 6-8 hours a day.  Incentive spirometry was taught.  Dressing was changed.  Marcaine pump and hemovac were discontinued.      POD #2, Continued  PT for ambulation and exercise program.  IV saline locked.  O2 discontinued.    The remainder of the hospital course was dedicated to ambulation and strengthening.   The patient was discharged on 2 Days Post-Op in  Good condition.  Blood products given:none  DIAGNOSTIC STUDIES: Recent vital signs: Patient Vitals for the past 24 hrs:  BP Temp Temp src Pulse Resp SpO2  08/15/12 0741 - - - - 18 -  08/15/12 0549 156/95 mmHg 99 F (37.2 C) Oral 91 16 98 %  08/15/12 0000 - - - - 16 -  08/14/12 2013 149/79 mmHg 98.7 F (37.1 C) Oral 82 16 100 %  08/14/12 2000 - - - - 16 -  08/14/12 1546 - - - - 18 -  08/14/12 1512 172/96 mmHg 98 F (36.7 C) Oral 81 18 99 %  Recent laboratory studies:  Recent Labs  08/13/12 1130 08/14/12 0442 08/15/12 0355  WBC 13.5* 9.3 9.7  HGB 13.2 13.2 13.6  HCT 38.4 37.8 38.9  PLT 362 336 348    Recent Labs  08/13/12 1130 08/14/12 0442 08/15/12 0355  NA  --  137 135  K  --  3.2* 2.9*  CL  --  98 94*  CO2  --  29 30  BUN  --  5* 6  CREATININE 0.45* 0.44* 0.40*  GLUCOSE  --  114* 106*  CALCIUM  --  8.9 9.5   Lab Results  Component Value Date   INR 0.99 08/08/2012     Recent Radiographic Studies :  Dg Chest 2 View  08/08/2012   *RADIOLOGY REPORT*  Clinical Data: 62 year old female preoperative study for knee surgery.  Hypertension.  CHEST - 2 VIEW  Comparison: None.  Findings: Lung volumes are within normal limits.  Cardiac size and mediastinal contours are within normal limits.  Visualized tracheal air column is within normal limits.  No pneumothorax, pulmonary edema, pleural effusion or confluent pulmonary opacity.  Mild spinal curvature.  Degenerative changes at the right  acromioclavicular joint. No acute osseous abnormality identified.  IMPRESSION: No acute cardiopulmonary abnormality.   Original Report Authenticated By: Erskine Speed, M.D.    DISCHARGE INSTRUCTIONS: Discharge Orders   Future Appointments Provider Department Dept Phone   08/17/2012 2:45 PM Kem Hensen March, MD [facility] [phone]   Future Orders Complete By Expires     CPM  As directed     Comments:      Continuous passive motion machine (CPM):      Use the CPM from 0 to 90 for 6-8 hours per day.      You may increase by 10 per day.  You may break it up into 2 or 3 sessions per day.      Use CPM for 2 weeks or until you are told to stop.    Call MD / Call 911  As directed     Comments:      If you experience chest pain or shortness of breath, CALL 911 and be transported to the hospital emergency room.  If you develope a fever above 101 F, pus (white drainage) or increased drainage or redness at the wound, or calf pain, call your surgeon's office.    Change dressing  As directed     Comments:      Change dressing on thursday, then change the dressing daily with sterile 4 x 4 inch gauze dressing and apply TED hose.    Constipation Prevention  As directed     Comments:      Drink plenty of fluids.  Prune juice may be helpful.  You may use a stool softener, such as Colace (over the counter) 100 mg twice a day.  Use MiraLax (over the counter) for constipation as needed.    Diet - low sodium heart healthy  As directed     Do not put a pillow under the knee. Place it under the heel.  As directed     Driving restrictions  As directed     Comments:      No driving for 6 weeks    Increase activity slowly as tolerated  As directed     Lifting restrictions  As directed     Comments:      No lifting for 6 weeks    TED hose  As directed     Comments:      Use stockings (TED hose) for 3 weeks on both leg(s).  You may remove them at night for sleeping.       DISCHARGE  MEDICATIONS:     Medication List    TAKE these medications       enoxaparin 40 MG/0.4ML injection  Commonly known as:  LOVENOX  Inject 0.4 mLs (40 mg total) into the skin daily.     hydrochlorothiazide 25 MG tablet  Commonly known as:  HYDRODIURIL  Take 1 tablet (25 mg total) by mouth daily.     methocarbamol 500 MG tablet  Commonly known as:  ROBAXIN  Take 1 tablet (500 mg total) by mouth every 6 (six) hours as needed.     nabumetone 500 MG tablet  Commonly known as:  RELAFEN  Take 1 tablet (500 mg total) by mouth 2 (two) times daily. Take tablet with meal.     oxyCODONE 5 MG immediate release tablet  Commonly known as:  Oxy IR/ROXICODONE  Take 1-2 tablets (5-10 mg total) by mouth every 3 (three) hours as needed.     OxyCODONE 10 mg T12a  Commonly known as:  OXYCONTIN  Take 1 tablet (10 mg total) by mouth every 12 (twelve) hours.        FOLLOW UP VISIT:       Follow-up Information   Follow up with Raymon Mutton, MD. Call on 08/28/2012.   Contact information:   [address] [place] [address] [phone]       DISPOSITION: HOME  CONDITION:  Good   Renee Rodriguez 08/15/2012, 11:44 AM

## 2012-08-15 NOTE — Progress Notes (Addendum)
Occupational Therapy Treatment Patient Details Name: Renee Rodriguez MRN: [id number] DOB: 01-Feb-1951 Today's Date: 08/15/2012 Time: 4098-1191 OT Time Calculation (min): 23 min  OT Assessment / Plan / Recommendation Comments on Treatment Session Pt practiced tub transfer and performed LB dressing. Pt planning to d/c home today.    Follow Up Recommendations  No OT follow up;Supervision/Assistance - 24 hour    Barriers to Discharge       Equipment Recommendations  3 in 1 bedside comode;Other (comment)    Recommendations for Other Services    Frequency Min 2X/week   Plan Discharge plan remains appropriate    Precautions / Restrictions Precautions Precautions: Knee Restrictions Weight Bearing Restrictions: Yes LLE Weight Bearing: Weight bearing as tolerated   Pertinent Vitals/Pain  No pain reported. Stiffness in knee.     ADL  Grooming: Wash/dry hands;Supervision/safety Where Assessed - Grooming: Unsupported standing Upper Body Dressing: Set up Where Assessed - Upper Body Dressing: Unsupported sitting Lower Body Dressing: Min guard Where Assessed - Lower Body Dressing: Supported sit to Pharmacist, hospital: Supervision/safety Statistician Method: Sit to Barista: Raised toilet seat with arms (or 3-in-1 over toilet) Toileting - Clothing Manipulation and Hygiene: Min guard Where Assessed - Engineer, mining and Hygiene: Sit to stand from 3-in-1 or toilet Tub/Shower Transfer: Performed;Min guard Tub/Shower Transfer Method: Science writer: Other (comment) (3 in 1) Equipment Used: Gait belt;Rolling walker Transfers/Ambulation Related to ADLs: Minguard for tub transfer. Pt at supervision level for sit <> stand transfers and minguard/supervision level for ambulation. ADL Comments: Pt practiced tub transfer at Minguard level. Practiced backing to tub and sitting on 3 in 1. Explained to daughter this method and for her  to be with patient when bathing. Pt at minguard level for LB dressing.    OT Diagnosis:    OT Problem List:   OT Treatment Interventions:     OT Goals Acute Rehab OT Goals OT Goal Formulation: With patient Time For Goal Achievement: 08/21/12 Potential to Achieve Goals: Good ADL Goals Pt Will Perform Lower Body Bathing: with modified independence;Sit to stand from chair Pt Will Perform Lower Body Dressing: with modified independence;Sit to stand from chair;Sit to stand from bed ADL Goal: Lower Body Dressing - Progress: Progressing toward goals Pt Will Transfer to Toilet: with modified independence;with DME;Ambulation ADL Goal: Toilet Transfer - Progress: Progressing toward goals Pt Will Perform Toileting - Clothing Manipulation: with modified independence;Standing ADL Goal: Toileting - Clothing Manipulation - Progress: Progressing toward goals Pt Will Perform Toileting - Hygiene: with modified independence;Sit to stand from 3-in-1/toilet;Sitting on 3-in-1 or toilet ADL Goal: Toileting - Hygiene - Progress: Progressing toward goals Pt Will Perform Tub/Shower Transfer: Tub transfer;with supervision;Ambulation;with DME ADL Goal: Tub/Shower Transfer - Progress: Progressing toward goals  Visit Information  Last OT Received On: 08/15/12 Assistance Needed: +1    Subjective Data      Prior Functioning       Cognition  Cognition Arousal/Alertness: Awake/alert Behavior During Therapy: WFL for tasks assessed/performed Overall Cognitive Status: Within Functional Limits for tasks assessed    Mobility  Bed Mobility Bed Mobility: Not assessed Transfers Transfers: Sit to Stand;Stand to Sit Sit to Stand: 5: Supervision;With upper extremity assist;From chair/3-in-1 Stand to Sit: 5: Supervision;With upper extremity assist;To chair/3-in-1 Details for Transfer Assistance: cues for hand placement       Balance     End of Session OT - End of Session Equipment Utilized During Treatment:  Gait belt Activity Tolerance: Patient tolerated treatment well  Patient left: in chair;with call bell/phone within reach;with family/visitor present   GO     Renee Rodriguez OTR/L [phone] 08/15/2012, 11:52 AM

## 2012-08-17 ENCOUNTER — Encounter: Payer: Self-pay | Admitting: Family Medicine

## 2012-08-17 ENCOUNTER — Ambulatory Visit (INDEPENDENT_AMBULATORY_CARE_PROVIDER_SITE_OTHER): Payer: 59 | Admitting: Family Medicine

## 2012-08-17 VITALS — BP 126/90 | HR 91 | Temp 98.1°F | Resp 18 | Ht 61.5 in | Wt 121.0 lb

## 2012-08-17 DIAGNOSIS — I10 Essential (primary) hypertension: Secondary | ICD-10-CM

## 2012-08-17 MED ORDER — ATENOLOL-CHLORTHALIDONE 50-25 MG PO TABS: ORAL_TABLET | ORAL | Status: DC

## 2012-08-17 NOTE — Patient Instructions (Addendum)
I have changed your medication to Atenolol- Chlorthalidone 50-25  1/2 tablet every morning to lower blood pressure. Get a monitor so you can check your blood pressure at home.

## 2012-08-21 NOTE — Progress Notes (Signed)
S:  This 62 y.o. AA female is here for HTN follow-up. She recently had TKR performed by Dr. Sherlean Foot. BP noted to be elevated on several visits. Pt denies any symptoms and has been compliant with medications.  PMHx, Soc Hx and Fam Hx reviewed.  ROS: Negative for diaphoresis, fatigue, CP or tightness, palpitations, edema, SOB or DOE, cough, HA, dizziness, weakness, numbness or syncope.  O: Filed Vitals:   08/17/12 1436  BP: 126/90                            Weight down 8 pounds since April 2014.  Pulse: 91  Temp: 98.1 F (36.7 C)  Resp: 18   GEN: In NAD; WN,WD. HENT: [place]/AT. EOMI w/ clear conj/sclerae. Otherwise unremarkable. COR: RRR. LUNGS: Normal resp rate and effort. SKIN; W&D.  MS: Pt ambulating w/ a walker; L knee in brace. NEURO: A&O x 3; CNs intact. Nonfocal.  A/P: HTN (hypertension)- medication change for better control.  Meds ordered this encounter  Medications  . atenolol-chlorthalidone (TENORETIC) 50-25 MG per tablet    Sig: Take 1/2 tablet every day or as directed to lower blood pressure.    Dispense:  30 tablet    Refill:  5

## 2012-09-04 ENCOUNTER — Ambulatory Visit: Payer: 59 | Attending: Orthopedic Surgery

## 2012-09-04 DIAGNOSIS — R609 Edema, unspecified: Secondary | ICD-10-CM | POA: Insufficient documentation

## 2012-09-04 DIAGNOSIS — R262 Difficulty in walking, not elsewhere classified: Secondary | ICD-10-CM | POA: Insufficient documentation

## 2012-09-04 DIAGNOSIS — Z96659 Presence of unspecified artificial knee joint: Secondary | ICD-10-CM | POA: Insufficient documentation

## 2012-09-04 DIAGNOSIS — M25669 Stiffness of unspecified knee, not elsewhere classified: Secondary | ICD-10-CM | POA: Insufficient documentation

## 2012-09-04 DIAGNOSIS — M25569 Pain in unspecified knee: Secondary | ICD-10-CM | POA: Insufficient documentation

## 2012-09-04 DIAGNOSIS — IMO0001 Reserved for inherently not codable concepts without codable children: Secondary | ICD-10-CM | POA: Insufficient documentation

## 2012-09-06 ENCOUNTER — Ambulatory Visit: Payer: 59 | Admitting: Rehabilitation

## 2012-09-10 ENCOUNTER — Ambulatory Visit: Payer: 59 | Admitting: Rehabilitation

## 2012-09-12 ENCOUNTER — Ambulatory Visit: Payer: 59

## 2012-09-14 ENCOUNTER — Ambulatory Visit: Payer: 59

## 2012-09-17 ENCOUNTER — Ambulatory Visit: Payer: 59

## 2012-09-19 ENCOUNTER — Ambulatory Visit: Payer: 59 | Admitting: Rehabilitation

## 2012-09-27 ENCOUNTER — Ambulatory Visit: Payer: 59 | Admitting: Rehabilitation

## 2012-10-03 ENCOUNTER — Ambulatory Visit: Payer: 59 | Attending: Orthopedic Surgery | Admitting: Physical Therapy

## 2012-10-03 DIAGNOSIS — R609 Edema, unspecified: Secondary | ICD-10-CM | POA: Insufficient documentation

## 2012-10-03 DIAGNOSIS — Z96659 Presence of unspecified artificial knee joint: Secondary | ICD-10-CM | POA: Insufficient documentation

## 2012-10-03 DIAGNOSIS — R262 Difficulty in walking, not elsewhere classified: Secondary | ICD-10-CM | POA: Insufficient documentation

## 2012-10-03 DIAGNOSIS — M25669 Stiffness of unspecified knee, not elsewhere classified: Secondary | ICD-10-CM | POA: Insufficient documentation

## 2012-10-03 DIAGNOSIS — M25569 Pain in unspecified knee: Secondary | ICD-10-CM | POA: Insufficient documentation

## 2012-10-03 DIAGNOSIS — IMO0001 Reserved for inherently not codable concepts without codable children: Secondary | ICD-10-CM | POA: Insufficient documentation

## 2012-10-04 ENCOUNTER — Ambulatory Visit: Payer: 59

## 2012-10-09 ENCOUNTER — Ambulatory Visit: Payer: 59

## 2012-10-11 ENCOUNTER — Ambulatory Visit: Payer: 59

## 2012-10-16 ENCOUNTER — Ambulatory Visit: Payer: 59 | Admitting: Rehabilitation

## 2012-10-19 ENCOUNTER — Ambulatory Visit: Payer: 59

## 2012-10-22 ENCOUNTER — Ambulatory Visit: Payer: 59

## 2012-10-24 ENCOUNTER — Ambulatory Visit: Payer: 59

## 2012-10-26 ENCOUNTER — Ambulatory Visit: Payer: 59 | Admitting: Family Medicine

## 2012-10-30 ENCOUNTER — Ambulatory Visit: Payer: 59 | Attending: Orthopedic Surgery

## 2012-10-30 DIAGNOSIS — R262 Difficulty in walking, not elsewhere classified: Secondary | ICD-10-CM | POA: Insufficient documentation

## 2012-10-30 DIAGNOSIS — M25569 Pain in unspecified knee: Secondary | ICD-10-CM | POA: Insufficient documentation

## 2012-10-30 DIAGNOSIS — M25669 Stiffness of unspecified knee, not elsewhere classified: Secondary | ICD-10-CM | POA: Insufficient documentation

## 2012-10-30 DIAGNOSIS — Z96659 Presence of unspecified artificial knee joint: Secondary | ICD-10-CM | POA: Insufficient documentation

## 2012-10-30 DIAGNOSIS — IMO0001 Reserved for inherently not codable concepts without codable children: Secondary | ICD-10-CM | POA: Insufficient documentation

## 2012-10-30 DIAGNOSIS — R609 Edema, unspecified: Secondary | ICD-10-CM | POA: Insufficient documentation

## 2012-12-04 ENCOUNTER — Ambulatory Visit (INDEPENDENT_AMBULATORY_CARE_PROVIDER_SITE_OTHER): Payer: 59 | Admitting: Family Medicine

## 2012-12-04 ENCOUNTER — Encounter: Payer: Self-pay | Admitting: Family Medicine

## 2012-12-04 VITALS — BP 172/110 | HR 83 | Temp 98.2°F | Resp 16 | Ht 60.5 in | Wt 123.2 lb

## 2012-12-04 DIAGNOSIS — M25562 Pain in left knee: Secondary | ICD-10-CM

## 2012-12-04 DIAGNOSIS — I10 Essential (primary) hypertension: Secondary | ICD-10-CM

## 2012-12-04 MED ORDER — ATENOLOL-CHLORTHALIDONE 50-25 MG PO TABS: ORAL_TABLET | ORAL | Status: DC

## 2012-12-04 MED ORDER — NABUMETONE 500 MG PO TABS: 500.0000 mg | ORAL_TABLET | Freq: Two times a day (BID) | ORAL | Status: AC

## 2012-12-04 NOTE — Progress Notes (Signed)
S:  This 62 y.o. AA female has HTN but has been w/o medication for 2 weeks; she did not have money to get anti-hypertensive medication. Pt recently underwent L TKR; she was released to return to work a few weeks ago. Pt still having some joint stiffness, especially after working 8-hr shift. Pt works in Public affairs consultant at [employer]. Pt reports mild lightheadedness but no HA, diaphoresis, CP or tightness, palpitations, weakness, numbness or syncope.  Patient Active Problem List   Diagnosis Date Noted  . Knee pain, left 10/19/2011  . HTN (hypertension) 10/19/2011   PMHx, Soc Hx and Fam Hx reviewed.  ROS: As per HPI.  O:  Filed Vitals:   12/04/12 1612  BP: 172/110                        BP recheck R arm: 150/90  Pulse: 83  Temp: 98.2 F (36.8 C)  Resp: 16   GEN: In NAD; WN,WD. HENT: [place]/AT; EOMI w/ clear conj/sclerae. Otherwise unremarkable. COR: RRR. LUNGS: Normal resp rate and effort. SKIN: W&D; L TKR scar well healed w/ mild keloid formation. NEURO: A&O x 3; CNs intact. Nonfocal.  A/P: HTN (hypertension)- Resume Tenoretic 50-25 mg (generic).  Meds ordered this encounter  Medications  . nabumetone (RELAFEN) 500 MG tablet    Sig: Take 1 tablet (500 mg total) by mouth 2 (two) times daily. Take tablet with meal.    Dispense:  60 tablet    Refill:  3  . atenolol-chlorthalidone (TENORETIC) 50-25 MG per tablet    Sig: Take 1/2 tablet every day or as directed to lower blood pressure.    Dispense:  30 tablet    Refill:  5

## 2012-12-04 NOTE — Patient Instructions (Addendum)
I have refilled your blood pressure medication and the medication for joint pain. Take your arthritis medication with food.

## 2013-01-18 ENCOUNTER — Ambulatory Visit: Payer: 59 | Admitting: Family Medicine

## 2013-02-12 ENCOUNTER — Ambulatory Visit: Payer: 59 | Admitting: Family Medicine

## 2013-09-12 ENCOUNTER — Other Ambulatory Visit: Payer: Self-pay | Admitting: Family Medicine

## 2014-12-01 ENCOUNTER — Emergency Department (INDEPENDENT_AMBULATORY_CARE_PROVIDER_SITE_OTHER): Payer: 59

## 2014-12-01 ENCOUNTER — Encounter (HOSPITAL_COMMUNITY): Payer: Self-pay | Admitting: Emergency Medicine

## 2014-12-01 ENCOUNTER — Emergency Department (INDEPENDENT_AMBULATORY_CARE_PROVIDER_SITE_OTHER)
Admission: EM | Admit: 2014-12-01 | Discharge: 2014-12-01 | Disposition: A | Discharge status: Home / Self Care | Payer: 59 | Source: Home / Self Care | Attending: Family Medicine | Admitting: Family Medicine

## 2014-12-01 DIAGNOSIS — I10 Essential (primary) hypertension: Secondary | ICD-10-CM

## 2014-12-01 DIAGNOSIS — S9032XA Contusion of left foot, initial encounter: Secondary | ICD-10-CM | POA: Diagnosis not present

## 2014-12-01 NOTE — Discharge Instructions (Signed)
Foot Contusion Tylenol for pain Elevate, ice, limit weight bearing a few days Ace wrap A foot contusion is a deep bruise to the foot. Contusions are the result of an injury that caused bleeding under the skin. The contusion may turn blue, purple, or yellow. Minor injuries will give you a painless contusion, but more severe contusions may stay painful and swollen for a few weeks. CAUSES  A foot contusion comes from a direct blow to that area, such as a heavy object falling on the foot. SYMPTOMS   Swelling of the foot.  Discoloration of the foot.  Tenderness or soreness of the foot. DIAGNOSIS  You will have a physical exam and will be asked about your history. You may need an X-ray of your foot to look for a broken bone (fracture).  TREATMENT  An elastic wrap may be recommended to support your foot. Resting, elevating, and applying cold compresses to your foot are often the best treatments for a foot contusion. Over-the-counter medicines may also be recommended for pain control. HOME CARE INSTRUCTIONS   Put ice on the injured area.  Put ice in a plastic bag.  Place a towel between your skin and the bag.  Leave the ice on for 15-20 minutes, 03-04 times a day.  Only take over-the-counter or prescription medicines for pain, discomfort, or fever as directed by your caregiver.  If told, use an elastic wrap as directed. This can help reduce swelling. You may remove the wrap for sleeping, showering, and bathing. If your toes become numb, cold, or blue, take the wrap off and reapply it more loosely.  Elevate your foot with pillows to reduce swelling.  Try to avoid standing or walking while the foot is painful. Do not resume use until instructed by your caregiver. Then, begin use gradually. If pain develops, decrease use. Gradually increase activities that do not cause discomfort until you have normal use of your foot.  See your caregiver as directed. It is very important to keep all  follow-up appointments in order to avoid any lasting problems with your foot, including long-term (chronic) pain. SEEK IMMEDIATE MEDICAL CARE IF:   You have increased redness, swelling, or pain in your foot.  Your swelling or pain is not relieved with medicines.  You have loss of feeling in your foot or are unable to move your toes.  Your foot turns cold or blue.  You have pain when you move your toes.  Your foot becomes warm to the touch.  Your contusion does not improve in 2 days. MAKE SURE YOU:   Understand these instructions.  Will watch your condition.  Will get help right away if you are not doing well or get worse. Document Released: 12/06/2005 Document Revised: 08/16/2011 Document Reviewed: 01/18/2011 [facility] Patient Information 2015 [place], [place]. This information is not intended to replace advice given to you by your health care provider. Make sure you discuss any questions you have with your health care provider.

## 2014-12-01 NOTE — ED Notes (Signed)
Left foot pain since dropping groceries on foot C/o right thigh cramping

## 2014-12-01 NOTE — ED Provider Notes (Signed)
CSN: [id number]     Arrival date & time 12/01/14  1301 History   First MD Initiated Contact with Patient 12/01/14 1326     Chief Complaint  Patient presents with  . Foot Pain  . Leg Pain   (Consider location/radiation/quality/duration/timing/severity/associated sxs/prior Treatment) HPI Comments: 64 year old females carrying groceries in her bag ripped and a bottle of juice fell onto her left foot. This occurred one week ago. The following day she noticed swelling to the dorsum of the foot and at the base of the toes. She started soaking it in warm water and is continued to have swelling. She is also continued to ambulate. She is now using a cane.   Past Medical History  Diagnosis Date  . Hypertension   . Arthritis   . GERD (gastroesophageal reflux disease)   . Anemia    Past Surgical History  Procedure Laterality Date  . Cesarean section    . 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 Stroke Mother     Cerebral hemorrhage  . Asthma Mother   . Alcohol abuse Mother   . Kidney disease Mother   . Hypertension Mother   . Hypertension Sister   . Kidney disease Daughter    Social History  Substance Use Topics  . Smoking status: Former Smoker    Quit date: 02/29/1996  . Smokeless tobacco: Never Used  . Alcohol Use: 1.5 oz/week    3 drink(s) per week     Comment: occasionally   OB History    No data available     Review of Systems  Constitutional: Negative.  Negative for fever and chills.  HENT: Negative.   Respiratory: Negative.   Musculoskeletal:       As per HPI  Skin: Negative for color change, pallor and rash.  Neurological: Negative.   All other systems reviewed and are negative.   Allergies  Shellfish allergy  Home Medications    Prior to Admission medications   Medication Sig Start Date End Date Taking? Authorizing Provider  atenolol-chlorthalidone (TENORETIC) 50-25 MG per tablet Take 1/2 tablet every day or as directed to lower blood pressure. 12/04/12   Maurice March, MD  methocarbamol (ROBAXIN) 500 MG tablet Take 1 tablet (500 mg total) by mouth every 6 (six) hours as needed. 08/14/12   Altamese Cabal, PA-C  nabumetone (RELAFEN) 500 MG tablet TAKE 1 TABLET BY MOUTH TWICE DAILY WITH A MEAL 09/12/13   Nelva Nay, PA-C   Meds Ordered and Administered this Visit  Medications - No data to display  BP 183/101 mmHg  Pulse 77  Temp(Src) 98.7 F (37.1 C) (Oral)  Resp 16  SpO2 98% No data found.   Physical Exam  Constitutional: She is oriented to person, place, and time. She appears well-developed and well-nourished. No distress.  HENT:  Head: Normocephalic and atraumatic.  Eyes: EOM are normal.  Neck: Normal range of motion. Neck supple.  Cardiovascular: Normal rate.   Musculoskeletal:  Dorsum of the left forefoot with mild swelling and tenderness, particularly at the base of the second and third toes. No deformity appreciated. No tenderness to the digits. Ankle is on injured. No bony tenderness or deformities. Distal neurovascular motor Sentry is intact. Pedal pulses 2+.  Neurological: She is alert and oriented to person, place, and time. No cranial nerve deficit. She exhibits normal muscle tone.  Skin: Skin is warm and dry.  Psychiatric: She has a normal mood and affect.  Nursing note and vitals reviewed.   ED Course  Procedures (including critical care time)  Labs Review Labs Reviewed - No data to display  Imaging Review Dg Foot Complete Left  12/01/2014   CLINICAL DATA:  Object dropped on to distal forefoot. Pain at the first and second metatarsals when walking.  EXAM: LEFT FOOT - COMPLETE 3+ VIEW  COMPARISON:  None.  FINDINGS: Three views of left foot are provided. Osseous alignment is  normal. No fracture line or displaced fracture fragment identified. No more than mild degenerative change at the interphalangeal joint spaces. Soft tissues about the left foot are unremarkable.  IMPRESSION: No acute findings.  No fracture or dislocation.   Electronically Signed   By: Bary Richard M.D.   On: 12/01/2014 13:50     Visual Acuity Review  Right Eye Distance:   Left Eye Distance:   Bilateral Distance:    Right Eye Near:   Left Eye Near:    Bilateral Near:         MDM   1. Contusion, foot, left, initial encounter   2. Essential hypertension    Tylenol for pain Elevate, ice, limit weight bearing a few days Ace wrap Info for [facility] for access to obtain PCP and tx HTN           Hayden Rasmussen, NP 12/01/14 1400  Hayden Rasmussen, NP 12/01/14 1401

## 2014-12-18 ENCOUNTER — Ambulatory Visit (INDEPENDENT_AMBULATORY_CARE_PROVIDER_SITE_OTHER): Payer: 59 | Admitting: Urgent Care

## 2014-12-18 ENCOUNTER — Encounter: Payer: Self-pay | Admitting: Urgent Care

## 2014-12-18 VITALS — BP 134/80 | HR 79 | Temp 98.4°F | Resp 16 | Ht 62.75 in | Wt 131.0 lb

## 2014-12-18 DIAGNOSIS — Z1239 Encounter for other screening for malignant neoplasm of breast: Secondary | ICD-10-CM

## 2014-12-18 DIAGNOSIS — Z973 Presence of spectacles and contact lenses: Secondary | ICD-10-CM | POA: Diagnosis not present

## 2014-12-18 DIAGNOSIS — M79672 Pain in left foot: Secondary | ICD-10-CM | POA: Diagnosis not present

## 2014-12-18 DIAGNOSIS — S9032XA Contusion of left foot, initial encounter: Secondary | ICD-10-CM | POA: Diagnosis not present

## 2014-12-18 DIAGNOSIS — K219 Gastro-esophageal reflux disease without esophagitis: Secondary | ICD-10-CM | POA: Diagnosis not present

## 2014-12-18 DIAGNOSIS — Z1211 Encounter for screening for malignant neoplasm of colon: Secondary | ICD-10-CM

## 2014-12-18 MED ORDER — ESOMEPRAZOLE MAGNESIUM 20 MG PO CPDR: 20.0000 mg | DELAYED_RELEASE_CAPSULE | Freq: Every day | ORAL | Status: DC

## 2014-12-18 MED ORDER — NAPROXEN SODIUM 550 MG PO TABS: 550.0000 mg | ORAL_TABLET | Freq: Two times a day (BID) | ORAL | Status: DC

## 2014-12-18 NOTE — Progress Notes (Signed)
MRN: [id number] DOB: Dec 18, 1950  Subjective:   Renee Rodriguez is a 64 y.o. female presenting for chief complaint of Establish Care  Left foot - reports 2 week history of left foot injury. Patient dropped a 64 ounce container of fluid on her left foot. She was seen at a different Urgent Care, x-rays were negative, offered conservative management. 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 She is active with her job and also goes to school twice a week at night. Patient has not been able to rest and cannot afford to take days off. Today, she reports ongoing left foot pain, swelling and bruising. She denies fever, numbness or tingling, bony deformity, laceration. Of note, patient had to leave her appointment early due to needing to pick up her grandchild from school. Her visit had to be abbreviated but she plans on returning to clinic for an annual exam and follow up.  Cough - patient reports an intermittent dry cough for the past several months. She has a history of reflux and knows spicy foods are a trigger but she does not want to stop eating this. Patient is an occasional, not every day smoker. Would like a refill of her reflux medicine.  Screening - patient would like to start screening for her health maintenance. She has never had a colonoscopy, last mammogram ~3 years ago. She also has not had a pap smear in ~10 years but would like to do this at an annual exam. She has not had an eye exam in ~2 years and would like a referral.  Denies any other aggravating or relieving factors, no other questions or concerns.  Renee Rodriguez is not currently taking any medications. Also is allergic to shellfish allergy.  Renee Rodriguez  has a past medical history of Hypertension; Arthritis; GERD (gastroesophageal reflux disease); and Anemia. Also  has past surgical history that includes Total knee arthroplasty (Left, 08/13/2012); Total knee arthroplasty (Left, 08/13/2012); Joint replacement (Left,  07/2013); Cesarean section (08/1982); and Tubal ligation (08/1982,01/1985).  Objective:   Vitals: BP 134/80 mmHg  Pulse 79  Temp(Src) 98.4 F (36.9 C) (Oral)  Resp 16  Ht 5' 2.75" (1.594 m)  Wt 131 lb (59.421 kg)  BMI 23.39 kg/m2  Physical Exam  Constitutional: She is oriented to person, place, and time. She appears well-developed and well-nourished.  HENT:  Posterior throat with frothy clear sputum. Oropharynx without exudates, enlarged tonsils, erythema.   Eyes: Pupils are equal, round, and reactive to light. No scleral icterus.  Cardiovascular: Normal rate, regular rhythm and intact distal pulses.   Pulmonary/Chest: No respiratory distress. She has no wheezes. She has no rales.  Abdominal: Soft. Bowel sounds are normal. She exhibits no distension and no mass. There is no tenderness.  Musculoskeletal: She exhibits no edema.       Left foot: There is tenderness and swelling (trace edema). There is normal range of motion, normal capillary refill, no crepitus, no deformity and no laceration.       Feet:  Neurological: She is alert and oriented to person, place, and time.  Skin: Skin is warm and dry. No rash noted. No erythema. No pallor.  Psychiatric: She has a normal mood and affect.    Assessment and Plan :   1. Foot contusion, left, initial encounter 2. Left foot pain -  Patient may need to have x-rays repeated but she cannot do them today. Will come back for this. She will use RICE method as best she can, Anaprox  for pain and inflammation. Recheck in 2 weeks.  3. Gastroesophageal reflux disease, esophagitis presence not specified - Start Nexium, recheck in 2 weeks  4. Screen for colon cancer - Ambulatory referral to Gastroenterology  5. Screening for breast cancer - MM Digital Screening; Future  6. Wears glasses - Referral to Dr. Dione BoozeGroat for an eye exam  Wallis BambergMario Lashaundra Lehrmann, PA-C [facility] [phone] 12/18/2014 3:27 PM

## 2014-12-18 NOTE — Progress Notes (Deleted)
    MRN: [id number]  Subjective:   [name] is a 64 y.o. female presenting for annual physical exam and ***.  Medical care team includes: PCP: Dow AdolphMCPHERSON,BARBARA, MD Vision: Plans on getting another eye exam, used to go to [facility]. Dental: Has not had this in a while. OB/GYN: Mammogram. Would like to reschedule pap smear. Specialists: Needs a colonoscopy.   Renee Rodriguez currently has no medications in their medication list. She is allergic to shellfish allergy.  Renee Rodriguez  has a past medical history of Hypertension; Arthritis; GERD (gastroesophageal reflux disease); and Anemia. Also  has past surgical history that includes Cesarean section; Total knee arthroplasty (Left, 08/13/2012); and Total knee arthroplasty (Left, 08/13/2012).  family history includes Alcohol abuse in her mother; Asthma in her mother; Hypertension in her mother and sister; Kidney disease in her daughter and mother; Stroke in her mother.  [facility]izations: [facility]. TDAP 2013.  ROS  Objective:   Vitals: BP 134/80 mmHg  Pulse 79  Temp(Src) 98.4 F (36.9 C) (Oral)  Resp 16  Ht 5' 2.75" (1.594 m)  Wt 131 lb (59.421 kg)  BMI 23.39 kg/m2  Physical Exam  Assessment and Plan :     Wallis BambergMario Sloan Takagi, PA-C [facility] [phone] 12/18/2014  1:10 PM

## 2014-12-18 NOTE — Patient Instructions (Addendum)

## 2014-12-23 ENCOUNTER — Other Ambulatory Visit: Payer: Self-pay

## 2014-12-23 DIAGNOSIS — Z1231 Encounter for screening mammogram for malignant neoplasm of breast: Secondary | ICD-10-CM

## 2014-12-31 ENCOUNTER — Encounter: Payer: Self-pay | Admitting: Urgent Care

## 2014-12-31 ENCOUNTER — Ambulatory Visit (INDEPENDENT_AMBULATORY_CARE_PROVIDER_SITE_OTHER): Payer: 59 | Admitting: Urgent Care

## 2014-12-31 ENCOUNTER — Ambulatory Visit (INDEPENDENT_AMBULATORY_CARE_PROVIDER_SITE_OTHER): Payer: 59

## 2014-12-31 VITALS — BP 167/97 | HR 76 | Temp 98.5°F | Resp 16 | Ht 61.5 in | Wt 131.6 lb

## 2014-12-31 DIAGNOSIS — M79672 Pain in left foot: Secondary | ICD-10-CM

## 2014-12-31 DIAGNOSIS — I10 Essential (primary) hypertension: Secondary | ICD-10-CM

## 2014-12-31 DIAGNOSIS — K219 Gastro-esophageal reflux disease without esophagitis: Secondary | ICD-10-CM

## 2014-12-31 DIAGNOSIS — Z0000 Encounter for general adult medical examination without abnormal findings: Secondary | ICD-10-CM

## 2014-12-31 DIAGNOSIS — S99922D Unspecified injury of left foot, subsequent encounter: Secondary | ICD-10-CM

## 2014-12-31 DIAGNOSIS — S92302D Fracture of unspecified metatarsal bone(s), left foot, subsequent encounter for fracture with routine healing: Secondary | ICD-10-CM | POA: Diagnosis not present

## 2014-12-31 DIAGNOSIS — R059 Cough, unspecified: Secondary | ICD-10-CM

## 2014-12-31 DIAGNOSIS — E785 Hyperlipidemia, unspecified: Secondary | ICD-10-CM

## 2014-12-31 DIAGNOSIS — R05 Cough: Secondary | ICD-10-CM

## 2014-12-31 LAB — COMPREHENSIVE METABOLIC PANEL
ALK PHOS: 103 U/L (ref 33–130)
ALT: 14 U/L (ref 6–29)
AST: 19 U/L (ref 10–35)
Albumin: 4.1 g/dL (ref 3.6–5.1)
BILIRUBIN TOTAL: 0.3 mg/dL (ref 0.2–1.2)
BUN: 20 mg/dL (ref 7–25)
CALCIUM: 9.9 mg/dL (ref 8.6–10.4)
CO2: 25 mmol/L (ref 20–31)
CREATININE: 0.49 mg/dL — AB (ref 0.50–0.99)
Chloride: 104 mmol/L (ref 98–110)
GLUCOSE: 86 mg/dL (ref 65–99)
Potassium: 3.9 mmol/L (ref 3.5–5.3)
Sodium: 136 mmol/L (ref 135–146)
Total Protein: 7.5 g/dL (ref 6.1–8.1)

## 2014-12-31 LAB — POCT URINALYSIS DIP (MANUAL ENTRY)
Bilirubin, UA: NEGATIVE
Glucose, UA: NEGATIVE
Ketones, POC UA: NEGATIVE
NITRITE UA: NEGATIVE
PH UA: 7
PROTEIN UA: NEGATIVE
Spec Grav, UA: 1.02
UROBILINOGEN UA: 0.2

## 2014-12-31 LAB — LIPID PANEL
Cholesterol: 252 mg/dL — ABNORMAL HIGH (ref 125–200)
HDL: 57 mg/dL (ref >=46)
LDL CALC: 173 mg/dL — AB (ref <130)
Total CHOL/HDL Ratio: 4.4 Ratio (ref <=5.0)
Triglycerides: 110 mg/dL (ref <150)
VLDL: 22 mg/dL (ref <30)

## 2014-12-31 LAB — CBC
HCT: 41.1 % (ref 36.0–46.0)
Hemoglobin: 14 g/dL (ref 12.0–15.0)
MCH: 28.7 pg (ref 26.0–34.0)
MCHC: 34.1 g/dL (ref 30.0–36.0)
MCV: 84.4 fL (ref 78.0–100.0)
MPV: 9.3 fL (ref 8.6–12.4)
Platelets: 398 10*3/uL (ref 150–400)
RBC: 4.87 MIL/uL (ref 3.87–5.11)
RDW: 14.3 % (ref 11.5–15.5)
WBC: 7.9 10*3/uL (ref 4.0–10.5)

## 2014-12-31 LAB — TSH: TSH: 0.874 u[IU]/mL (ref 0.350–4.500)

## 2014-12-31 MED ORDER — TRAMADOL HCL 50 MG PO TABS: 50.0000 mg | ORAL_TABLET | Freq: Three times a day (TID) | ORAL | Status: DC | PRN

## 2014-12-31 MED ORDER — LISINOPRIL-HYDROCHLOROTHIAZIDE 10-12.5 MG PO TABS: 1.0000 | ORAL_TABLET | Freq: Every day | ORAL | Status: DC

## 2014-12-31 NOTE — Patient Instructions (Addendum)
Keeping You Healthy  Get These Tests  Blood Pressure- Have your blood pressure checked by your healthcare provider at least once a year.  Normal blood pressure is 120/80.  Weight- Have your body mass index (BMI) calculated to screen for obesity.  BMI is a measure of body fat based on height and weight.  You can calculate your own BMI at www.nhlbisupport.com/bmi/  Cholesterol- Have your cholesterol checked every year.  Diabetes- Have your blood sugar checked every year if you have high blood pressure, high cholesterol, a family history of diabetes or if you are overweight.  Pap Test - Have a pap test every 1 to 5 years if you have been sexually active.  If you are older than 65 and recent pap tests have been normal you may not need additional pap tests.  In addition, if you have had a hysterectomy  for benign disease additional pap tests are not necessary.  Mammogram-Yearly mammograms are essential for early detection of breast cancer  Screening for Colon Cancer- Colonoscopy starting at age 50. Screening may begin sooner depending on your family history and other health conditions.  Follow up colonoscopy as directed by your Gastroenterologist.  Screening for Osteoporosis- Screening begins at age 65 with bone density scanning, sooner if you are at higher risk for developing Osteoporosis.  Get these medicines  Calcium with Vitamin D- Your body requires 1200-1500 mg of Calcium a day and 800-1000 IU of Vitamin D a day.  You can only absorb 500 mg of Calcium at a time therefore Calcium must be taken in 2 or 3 separate doses throughout the day.  Hormones- Hormone therapy has been associated with increased risk for certain cancers and heart disease.  Talk to your healthcare provider about if you need relief from menopausal symptoms.  Aspirin- Ask your healthcare provider about taking Aspirin to prevent Heart Disease and Stroke.  Get these Immuniztions  Flu shot- Every fall  Pneumonia shot-  Once after the age of 65; if you are younger ask your healthcare provider if you need a pneumonia shot.  Tetanus- Every ten years.  Zostavax- Once after the age of 60 to prevent shingles.  Take these steps  Don't smoke- Your healthcare provider can help you quit. For tips on how to quit, ask your healthcare provider or go to www.smokefree.gov or call 1-800 QUIT-NOW.  Be physically active- Exercise 5 days a week for a minimum of 30 minutes.  If you are not already physically active, start slow and gradually work up to 30 minutes of moderate physical activity.  Try walking, dancing, bike riding, swimming, etc.  Eat a healthy diet- Eat a variety of healthy foods such as fruits, vegetables, whole grains, low fat milk, low fat cheeses, yogurt, lean meats, chicken, fish, eggs, dried beans, tofu, etc.  For more information go to www.thenutritionsource.org  Dental visit- Brush and floss teeth twice daily; visit your dentist twice a year.  Eye exam- Visit your Optometrist or Ophthalmologist yearly.  Drink alcohol in moderation- Limit alcohol intake to one drink or less a day.  Never drink and drive.  Depression- Your emotional health is as important as your physical health.  If you're feeling down or losing interest in things you normally enjoy, please talk to your healthcare provider.  Seat Belts- can save your life; always wear one  Smoke/Carbon Monoxide detectors- These detectors need to be installed on the appropriate level of your home.  Replace batteries at least once a year.  Violence- If   anyone is threatening or hurting you, please tell your healthcare provider.  Living Will/ Health care power of attorney- Discuss with your healthcare provider and family.   Metatarsal Fracture A metatarsal fracture is a break in a metatarsal bone. Metatarsal bones connect your toe bones to your ankle bones. CAUSES This type of fracture may be caused by:  A sudden twisting of your foot.  A fall  onto your foot.  Overuse or repetitive exercise. RISK FACTORS This condition is more likely to develop in people who:  Play contact sports.  Have a bone disease.  Have a low calcium level. SYMPTOMS Symptoms of this condition include:  Pain that is worse when walking or standing.  Pain when pressing on the foot or moving the toes.  Swelling.  Bruising on the top or bottom of the foot.  A foot that appears shorter than the other one. DIAGNOSIS This condition is diagnosed with a physical exam. You may also have imaging tests, such as:  X-rays.  A CT scan.  MRI. TREATMENT Treatment for this condition depends on its severity and whether a bone has moved out of place. Treatment may involve:  Rest.  Wearing foot support such as a cast, splint, or boot for several weeks.  Using crutches.  Surgery to move bones back into the right position. Surgery is usually needed if there are many pieces of broken bone or bones that are very out of place (displaced fracture).  Physical therapy. This may be needed to help you regain full movement and strength in your foot. You will need to return to your health care provider to have X-rays taken until your bones heal. Your health care provider will look at the X-rays to make sure that your foot is healing well. HOME CARE INSTRUCTIONS  If You Have a Cast:  Do not stick anything inside the cast to scratch your skin. Doing that increases your risk of infection.  Check the skin around the cast every day. Report any concerns to your health care provider. You may put lotion on dry skin around the edges of the cast. Do not apply lotion to the skin underneath the cast.  Keep the cast clean and dry. If You Have a Splint or a Supportive Boot:  Wear it as directed by your health care provider. Remove it only as directed by your health care provider.  Loosen it if your toes become numb and tingle, or if they turn cold and blue.  Keep it clean  and dry. Bathing  Do not take baths, swim, or use a hot tub until your health care provider approves. Ask your health care provider if you can take showers. You may only be allowed to take sponge baths for bathing.  If your health care provider approves bathing and showering, cover the cast or splint with a watertight plastic bag to protect it from water. Do not let the cast or splint get wet. Managing Pain, Stiffness, and Swelling  If directed, apply ice to the injured area (if you have a splint, not a cast).  Put ice in a plastic bag.  Place a towel between your skin and the bag.  Leave the ice on for 20 minutes, 2-3 times per day.  Move your toes often to avoid stiffness and to lessen swelling.  Raise (elevate) the injured area above the level of your heart while you are sitting or lying down. Driving  Do not drive or operate heavy machinery while taking pain medicine.  Do not drive while wearing foot support on a foot that you use for driving. Activity  Return to your normal activities as directed by your health care provider. Ask your health care provider what activities are safe for you.  Perform exercises as directed by your health care provider or physical therapist. Safety  Do not use the injured foot to support your body weight until your health care provider says that you can. Use crutches as directed by your health care provider. General Instructions  Do not put pressure on any part of the cast or splint until it is fully hardened. This may take several hours.  Do not use any tobacco products, including cigarettes, chewing tobacco, or e-cigarettes. Tobacco can delay bone healing. If you need help quitting, ask your health care provider.  Take medicines only as directed by your health care provider.  Keep all follow-up visits as directed by your health care provider. This is important. SEEK MEDICAL CARE IF:  You have a fever.  Your cast, splint, or boot is too  loose or too tight.  Your cast, splint, or boot is damaged.  Your pain medicine is not helping.  You have pain, tingling, or numbness in your foot that is not going away. SEEK IMMEDIATE MEDICAL CARE IF:  You have severe pain.  You have tingling or numbness in your foot that is getting worse.  Your foot feels cold or becomes numb.  Your foot changes color.   This information is not intended to replace advice given to you by your health care provider. Make sure you discuss any questions you have with your health care provider.   Document Released: 11/06/2001 Document Revised: 07/01/2014 Document Reviewed: 12/11/2013 Elsevier Interactive Patient Education 2016 [employer].    Food Choices for Gastroesophageal Reflux Disease, Adult When you have gastroesophageal reflux disease (GERD), the foods you eat and your eating habits are very important. Choosing the right foods can help ease the discomfort of GERD. WHAT GENERAL GUIDELINES DO I NEED TO FOLLOW?  Choose fruits, vegetables, whole grains, low-fat dairy products, and low-fat meat, fish, and poultry.  Limit fats such as oils, salad dressings, butter, nuts, and avocado.  Keep a food diary to identify foods that cause symptoms.  Avoid foods that cause reflux. These may be different for different people.  Eat frequent small meals instead of three large meals each day.  Eat your meals slowly, in a relaxed setting.  Limit fried foods.  Cook foods using methods other than frying.  Avoid drinking alcohol.  Avoid drinking large amounts of liquids with your meals.  Avoid bending over or lying down until 2-3 hours after eating. WHAT FOODS ARE NOT RECOMMENDED? The following are some foods and drinks that may worsen your symptoms: Vegetables Tomatoes. Tomato juice. Tomato and spaghetti sauce. Chili peppers. Onion and garlic. Horseradish. Fruits Oranges, grapefruit, and lemon (fruit and juice). Meats High-fat meats, fish,  and poultry. This includes hot dogs, ribs, ham, sausage, salami, and bacon. Dairy Whole milk and chocolate milk. Sour cream. Cream. Butter. Ice cream. Cream cheese.  Beverages Coffee and tea, with or without caffeine. Carbonated beverages or energy drinks. Condiments Hot sauce. Barbecue sauce.  Sweets/Desserts Chocolate and cocoa. Donuts. Peppermint and spearmint. Fats and Oils High-fat foods, including [place] fries and potato chips. Other Vinegar. Strong spices, such as black pepper, white pepper, red pepper, cayenne, curry powder, cloves, ginger, and chili powder. The items listed above may not be a complete list of foods and beverages  to avoid. Contact your dietitian for more information.   This information is not intended to replace advice given to you by your health care provider. Make sure you discuss any questions you have with your health care provider.   Document Released: 02/14/2005 Document Revised: 03/07/2014 Document Reviewed: 12/19/2012 Elsevier Interactive Patient Education [employer] Inc.

## 2014-12-31 NOTE — Progress Notes (Signed)
MRN: [id number] DOB: 10-18-1950  Subjective:   Renee Rodriguez is a 64 y.o. female presenting for chief complaint of Follow-up  Patient is here for her annual exam which we were not able to complete at her last visit due to patient having to leave for personal reasons. We will be doing both an annual exam and office visit today.  Left foot - seen approximately 2 weeks ago for abbreviated office visit as above. Reports sharp pain over the injury to her left distal foot, rated 9/10, has difficulty bearing weight, is limping. Her swelling, bruising has improved from her foot injury involving dropping a heavy container of fluid over it. She has continued to work and has gone on with her daily activities. Anaprox has helped.  HTN - has a history of this. Patient stopped taking medications because she ran out. ROS as below. Tries to avoid salt in her diet. Denies smoking cigarettes, quit many years ago. Does not exercise.  GERD - patient did not start Nexium, cough is improved but still there and intermittent.  Denies any other aggravating or relieving factors, no other questions or concerns.  Renee Rodriguez has a current medication list which includes the following prescription(s): esomeprazole and naproxen sodium. Also is allergic to shellfish allergy.  Renee Rodriguez  has a past medical history of Hypertension; Arthritis; GERD (gastroesophageal reflux disease); and Anemia. 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 Her family history includes Alcohol abuse in her mother; Asthma in her mother; Cirrhosis in her mother; Hypertension in her mother and sister; Kidney disease in her mother; Stroke in her mother.  Immunizations are up to date. Flu shot throughout [facility].  Objective:   Vitals: BP 167/97 mmHg  Pulse 76   Temp(Src) 98.5 F (36.9 C) (Oral)  Resp 16  Ht 5' 1.5" (1.562 m)  Wt 131 lb 9.6 oz (59.693 kg)  BMI 24.47 kg/m2  Physical Exam  Constitutional: She is oriented to person, place, and time. She appears well-developed and well-nourished.  HENT:  TM's intact bilaterally, no effusions or erythema. Nares patent, nasal turbinates pink and moist, nasal passages patent. No sinus tenderness. Oropharynx clear, mucous membranes moist, dentition in good repair.  Eyes: Conjunctivae and EOM are normal. Pupils are equal, round, and reactive to light. Right eye exhibits no discharge. Left eye exhibits no discharge. No scleral icterus.  Neck: Normal range of motion. Neck supple. No thyromegaly present.  Cardiovascular: Normal rate, regular rhythm and intact distal pulses.  Exam reveals no gallop and no friction rub.   No murmur heard. Pulmonary/Chest: No respiratory distress. She has no wheezes. She has no rales.  Abdominal: Soft. Bowel sounds are normal. She exhibits no distension and no mass. There is no tenderness.  Musculoskeletal:       Left ankle: She exhibits decreased range of motion and swelling (trace). She exhibits no ecchymosis, no deformity, no laceration and normal pulse. Tenderness (over area depicted). No lateral malleolus, no medial malleolus, no AITFL, no CF ligament, no posterior TFL, no head of 5th metatarsal and no proximal fibula tenderness found. Achilles tendon exhibits no pain, no defect and normal Thompson's test results.       Feet:  Lymphadenopathy:    She has no cervical adenopathy.  Neurological: She is alert and oriented to person, place, and time. She has normal reflexes. Coordination (limping, heavily favoring left foot)  abnormal.  Skin: Skin is warm and dry. No rash noted. No erythema. No pallor.  Psychiatric: She has a normal mood and affect.   [facility] reading (PRIMARY) by  Dr. Patsy Lager and PA-Korianna Washer. Left foot - fracture of 2nd metatarsal head of with callus formation,  please comment.  Assessment and Plan :   1. Annual physical exam - Stable, labs pending - Discussed healthy lifestyle, diet, exercise, preventative care, vaccinations, and addressed patient's concerns.   2. Fracture of metatarsal of left foot, closed, with routine healing, subsequent encounter 3. Foot injury, left, subsequent encounter 4. Left foot pain - Cam Walker today, recommended using crutches. Patient states that she cannot take time off work. Pending radiologist's over read, may need to refer to orthopedics. followup in 2 weeks.   5. Essential hypertension - Start lis-HCT, recommended dietary modifications. Recheck in one month.  6. Gastroesophageal reflux disease, esophagitis presence not specified 7. Cough - Recommended patient start Nexium to dress reflux she has a history of and still eats GERD causing foods. Patient agreed. However, due to her smoking history, consider chest x-ray if no improvement on Nexium.   Wallis Bamberg, PA-C [facility] [phone] 12/31/2014 2:39 PM

## 2015-01-01 MED ORDER — ATORVASTATIN CALCIUM 10 MG PO TABS: 10.0000 mg | ORAL_TABLET | Freq: Every day | ORAL | Status: DC

## 2015-01-02 NOTE — Progress Notes (Addendum)
Reviewed film report- she does indeed have a MT shaft fracture with callus formation LEFT FOOT - COMPLETE 3+ VIEW  COMPARISON: 12/01/2014.  FINDINGS: Subacute fracture involving the distal shaft of the 2nd metatarsal, not visible on the previous examination, demonstrating appropriate healing with callus formation. No fractures elsewhere. Osseous demineralization. Well preserved joint spaces for age. Large plantar calcaneal spur.  IMPRESSION: Subacute fracture involving the distal shaft of the 2nd metatarsal with appropriate healing  Called pt to discuss- LMOM. Will try her back later so we can discuss   addnd 11/9- called her again, LMOM.  Please come and see us in the next few days for a recheck of her foot injury.  Will also send a mychart message

## 2015-01-07 ENCOUNTER — Encounter: Payer: Self-pay | Admitting: Family Medicine

## 2015-01-15 ENCOUNTER — Encounter: Payer: Self-pay | Admitting: Urgent Care

## 2015-01-15 ENCOUNTER — Ambulatory Visit (INDEPENDENT_AMBULATORY_CARE_PROVIDER_SITE_OTHER): Payer: 59 | Admitting: Urgent Care

## 2015-01-15 ENCOUNTER — Telehealth: Payer: Self-pay

## 2015-01-15 VITALS — BP 96/63 | HR 96 | Temp 98.9°F | Resp 16 | Ht 61.5 in | Wt 131.4 lb

## 2015-01-15 DIAGNOSIS — S92302D Fracture of unspecified metatarsal bone(s), left foot, subsequent encounter for fracture with routine healing: Secondary | ICD-10-CM

## 2015-01-15 DIAGNOSIS — I10 Essential (primary) hypertension: Secondary | ICD-10-CM | POA: Diagnosis not present

## 2015-01-15 DIAGNOSIS — M79672 Pain in left foot: Secondary | ICD-10-CM

## 2015-01-15 MED ORDER — TRAMADOL HCL 50 MG PO TABS: 50.0000 mg | ORAL_TABLET | Freq: Three times a day (TID) | ORAL | Status: DC | PRN

## 2015-01-15 NOTE — Progress Notes (Signed)
MRN: [id number] DOB: May 21, 1950  Subjective:   Renee Rodriguez is a 64 y.o. female presenting for chief complaint of Follow-up  Foot fracture - patient has been wearing cam walker boot. 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 Cannot use crutches while at work. Reports that her foot pain and swelling is slightly improved but still there. She has been using Tramadol and Anaprox with some relief of her pain. She would like to have her FMLA paperwork completed to allow her foot to heal well. She denies swelling, redness, bruising. She does admit that she had a small "knot" over her shin near the top of the cam walker boot that has since decreased on its own. She denies calf pain, calf swelling, erythema. Denies history of DVT.  HTN - patient is taking low dose HCT-lis combo. Denies lightheadedness, dizziness, chronic headache, double vision, chest pain, shortness of breath, heart racing, palpitations, nausea, vomiting, abdominal pain, hematuria, lower leg swelling.  Denies any other aggravating or relieving factors, no other questions or concerns.  Renee Rodriguez has a current medication list which includes the following prescription(s): atorvastatin, esomeprazole, lisinopril-hydrochlorothiazide, naproxen sodium, and tramadol. Also is allergic to shellfish allergy.  Renee Rodriguez  has a past medical history of Hypertension; Arthritis; GERD (gastroesophageal reflux disease); and Anemia. 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  Objective:   Vitals: BP 96/63 mmHg  Pulse 96  Temp(Src) 98.9 F (37.2 C) (Oral)  Resp 16  Ht 5' 1.5" (1.562 m)  Wt 131 lb 6.4 oz (59.603 kg)  BMI 24.43 kg/m2  BP Readings from Last 3 Encounters:  01/15/15 96/63  12/31/14 167/97  12/18/14 134/80   BP 102/72 on  recheck by PA-Zarin Knupp.  Physical Exam  Constitutional: She is oriented to person, place, and time. She appears well-developed and well-nourished.  HENT:  Mouth/Throat: Oropharynx is clear and moist.  Eyes: No scleral icterus.  Cardiovascular: Normal rate, regular rhythm and intact distal pulses.  Exam reveals no gallop and no friction rub.   No murmur heard. Pulmonary/Chest: No respiratory distress. She has no wheezes. She has no rales.  Musculoskeletal: She exhibits no edema.       Left knee: She exhibits ecchymosis (over area depicted). She exhibits normal range of motion, no swelling, no effusion, no deformity, no laceration, no erythema and no bony tenderness. No tenderness found.       Left ankle: She exhibits swelling (trace over area depicted) and ecchymosis (slight ecchymosis over area depicted). She exhibits normal range of motion, no deformity, no laceration and normal pulse. Tenderness (over area depicted). Achilles tendon exhibits no pain, no defect and normal Thompson's test results.       Legs:      Feet:  Neurological: She is alert and oriented to person, place, and time.  Skin: Skin is warm and dry. No rash noted. No erythema. No pallor.  Psychiatric: She has a normal mood and affect.   Assessment and Plan :   1. Fracture of 2nd metatarsal, left, with routine healing, subsequent encounter 2. Left foot pain - Stable, physical exam findings show improvement. Her work responsibilities are causing issues with her pain and may be delaying her healing. Will write out of work until she can be evaluated by ortho. FMLA paperwork to be completed and turned in.  3. Essential hypertension -  Stable, continue medications for now, recheck in 4 weeks. It is possible that despite her history of HTN and being on a combo medication in the past, that her BP was affected by severe pain secondary to her metatarsal fracture.  Wallis BambergMario Tailyn Hantz, PA-C [facility] [phone] 01/15/2015 2:42 PM

## 2015-01-15 NOTE — Telephone Encounter (Signed)
[employer] faxed in paperwork to be completed my Urban GibsonMani, I have filled out what I can from the OV notes, and highlighted the areas that need to be looked at. Please fill out and return to the FMLA/DISABILITY box at 102 checkout desk in 5-7 business days, so that we can scan and fax them for the patient. I will place in your box on 01/15/15.

## 2015-01-15 NOTE — Patient Instructions (Signed)
Metatarsal Fracture A metatarsal fracture is a break in a metatarsal bone. Metatarsal bones connect your toe bones to your ankle bones. CAUSES This type of fracture may be caused by:  A sudden twisting of your foot.  A fall onto your foot.  Overuse or repetitive exercise. RISK FACTORS This condition is more likely to develop in people who:  Play contact sports.  Have a bone disease.  Have a low calcium level. SYMPTOMS Symptoms of this condition include:  Pain that is worse when walking or standing.  Pain when pressing on the foot or moving the toes.  Swelling.  Bruising on the top or bottom of the foot.  A foot that appears shorter than the other one. DIAGNOSIS This condition is diagnosed with a physical exam. You may also have imaging tests, such as:  X-rays.  A CT scan.  MRI. TREATMENT Treatment for this condition depends on its severity and whether a bone has moved out of place. Treatment may involve:  Rest.  Wearing foot support such as a cast, splint, or boot for several weeks.  Using crutches.  Surgery to move bones back into the right position. Surgery is usually needed if there are many pieces of broken bone or bones that are very out of place (displaced fracture).  Physical therapy. This may be needed to help you regain full movement and strength in your foot. You will need to return to your health care provider to have X-rays taken until your bones heal. Your health care provider will look at the X-rays to make sure that your foot is healing well. HOME CARE INSTRUCTIONS  If You Have a Cast:  Do not stick anything inside the cast to scratch your skin. Doing that increases your risk of infection.  Check the skin around the cast every day. Report any concerns to your health care provider. You may put lotion on dry skin around the edges of the cast. Do not apply lotion to the skin underneath the cast.  Keep the cast clean and dry. If You Have a Splint  or a Supportive Boot:  Wear it as directed by your health care provider. Remove it only as directed by your health care provider.  Loosen it if your toes become numb and tingle, or if they turn cold and blue.  Keep it clean and dry. Bathing  Do not take baths, swim, or use a hot tub until your health care provider approves. Ask your health care provider if you can take showers. You may only be allowed to take sponge baths for bathing.  If your health care provider approves bathing and showering, cover the cast or splint with a watertight plastic bag to protect it from water. Do not let the cast or splint get wet. Managing Pain, Stiffness, and Swelling  If directed, apply ice to the injured area (if you have a splint, not a cast).  Put ice in a plastic bag.  Place a towel between your skin and the bag.  Leave the ice on for 20 minutes, 2-3 times per day.  Move your toes often to avoid stiffness and to lessen swelling.  Raise (elevate) the injured area above the level of your heart while you are sitting or lying down. Driving  Do not drive or operate heavy machinery while taking pain medicine.  Do not drive while wearing foot support on a foot that you use for driving. Activity  Return to your normal activities as directed by your health care   provider. Ask your health care provider what activities are safe for you.  Perform exercises as directed by your health care provider or physical therapist. Safety  Do not use the injured foot to support your body weight until your health care provider says that you can. Use crutches as directed by your health care provider. General Instructions  Do not put pressure on any part of the cast or splint until it is fully hardened. This may take several hours.  Do not use any tobacco products, including cigarettes, chewing tobacco, or e-cigarettes. Tobacco can delay bone healing. If you need help quitting, ask your health care  provider.  Take medicines only as directed by your health care provider.  Keep all follow-up visits as directed by your health care provider. This is important. SEEK MEDICAL CARE IF:  You have a fever.  Your cast, splint, or boot is too loose or too tight.  Your cast, splint, or boot is damaged.  Your pain medicine is not helping.  You have pain, tingling, or numbness in your foot that is not going away. SEEK IMMEDIATE MEDICAL CARE IF:  You have severe pain.  You have tingling or numbness in your foot that is getting worse.  Your foot feels cold or becomes numb.  Your foot changes color.   This information is not intended to replace advice given to you by your health care provider. Make sure you discuss any questions you have with your health care provider.   Document Released: 11/06/2001 Document Revised: 07/01/2014 Document Reviewed: 12/11/2013 Elsevier Interactive Patient Education 2016 Elsevier Inc.  

## 2015-01-15 NOTE — Telephone Encounter (Signed)
Forms completed and put in FMLA box.

## 2015-01-16 ENCOUNTER — Encounter: Payer: Self-pay | Admitting: Internal Medicine

## 2015-01-16 NOTE — Telephone Encounter (Signed)
Forms scanned and faxed on 01/16/15

## 2015-02-12 ENCOUNTER — Ambulatory Visit: Payer: 59 | Admitting: Urgent Care

## 2015-03-05 ENCOUNTER — Ambulatory Visit (AMBULATORY_SURGERY_CENTER): Payer: Self-pay

## 2015-03-05 VITALS — Ht 61.0 in | Wt 137.4 lb

## 2015-03-05 DIAGNOSIS — Z1211 Encounter for screening for malignant neoplasm of colon: Secondary | ICD-10-CM

## 2015-03-05 NOTE — Progress Notes (Signed)
Allergies to eggs (nausea) No diet/weight loss meds No home oxygen No past problems with anesthesia Has email and internet; registered for emmi

## 2015-03-12 ENCOUNTER — Encounter: Payer: 59 | Admitting: Urgent Care

## 2015-03-12 NOTE — Patient Instructions (Signed)
We recommend that you schedule a mammogram for breast cancer screening. Typically, you do not need a referral to do this. Please contact a local imaging center to schedule your mammogram.   [facility]ospital - [phone]  *ask for the Radiology Department [facility]) - [phone] or [phone]  [facility] - [phone] [facility] - [phone] [facility] - [phone]  *ask for the Radiology Department [facility] - [phone]  *ask for the Radiology Department [facility] - [phone]  *ask for the Mammography Department [facility] - [phone] 

## 2015-03-13 NOTE — Progress Notes (Signed)
This encounter was created in error - please disregard.

## 2015-03-18 ENCOUNTER — Encounter: Payer: Self-pay | Admitting: Internal Medicine

## 2015-03-18 ENCOUNTER — Ambulatory Visit (AMBULATORY_SURGERY_CENTER): Payer: 59 | Admitting: Internal Medicine

## 2015-03-18 VITALS — BP 179/98 | HR 76 | Temp 96.8°F | Resp 18 | Ht 61.0 in | Wt 137.0 lb

## 2015-03-18 DIAGNOSIS — E669 Obesity, unspecified: Secondary | ICD-10-CM | POA: Diagnosis not present

## 2015-03-18 DIAGNOSIS — Z1211 Encounter for screening for malignant neoplasm of colon: Secondary | ICD-10-CM | POA: Diagnosis present

## 2015-03-18 DIAGNOSIS — K219 Gastro-esophageal reflux disease without esophagitis: Secondary | ICD-10-CM | POA: Diagnosis not present

## 2015-03-18 DIAGNOSIS — I10 Essential (primary) hypertension: Secondary | ICD-10-CM | POA: Diagnosis not present

## 2015-03-18 MED ORDER — SODIUM CHLORIDE 0.9 % IV SOLN: 500.0000 mL | INTRAVENOUS | Status: DC

## 2015-03-18 NOTE — Progress Notes (Signed)
approx 1423 pt started coughing and i noticed fluid coming out her mouth and nose, head immediately dropped to steep t burg and oropharynx and naso pharynx suctioned.  Sats stayed same, no more sedation given  Situation reported to RN in PACU

## 2015-03-18 NOTE — Progress Notes (Signed)
No problems noted in the recovery room. Maw  Pt was instructed to take her blood pressure med when she gets home.  Her daughter understands. maw

## 2015-03-18 NOTE — Patient Instructions (Signed)
YOU HAD AN ENDOSCOPIC PROCEDURE TODAY AT THE [facility]:   Refer to the procedure report that was given to you for any specific questions about what was found during the examination.  If the procedure report does not answer your questions, please call your gastroenterologist to clarify.  If you requested that your care partner not be given the details of your procedure findings, then the procedure report has been included in a sealed envelope for you to review at your convenience later.  YOU SHOULD EXPECT: Some feelings of bloating in the abdomen. Passage of more gas than usual.  Walking can help get rid of the air that was put into your GI tract during the procedure and reduce the bloating. If you had a lower endoscopy (such as a colonoscopy or flexible sigmoidoscopy) you may notice spotting of blood in your stool or on the toilet paper. If you underwent a bowel prep for your procedure, you may not have a normal bowel movement for a few days.  Please Note:  You might notice some irritation and congestion in your nose or some drainage.  This is from the oxygen used during your procedure.  There is no need for concern and it should clear up in a day or so.  SYMPTOMS TO REPORT IMMEDIATELY:   Following lower endoscopy (colonoscopy or flexible sigmoidoscopy):  Excessive amounts of blood in the stool  Significant tenderness or worsening of abdominal pains  Swelling of the abdomen that is new, acute  Fever of 100F or higher   For urgent or emergent issues, a gastroenterologist can be reached at any hour by calling [phone].   DIET: Your first meal following the procedure should be a small meal and then it is ok to progress to your normal diet. Heavy or fried foods are harder to digest and may make you feel nauseous or bloated.  Likewise, meals heavy in dairy and vegetables can increase bloating.  Drink plenty of fluids but you should avoid alcoholic beverages for 24  hours.  ACTIVITY:  You should plan to take it easy for the rest of today and you should NOT DRIVE or use heavy machinery until tomorrow (because of the sedation medicines used during the test).    FOLLOW UP: Our staff will call the number listed on your records the next business day following your procedure to check on you and address any questions or concerns that you may have regarding the information given to you following your procedure. If we do not reach you, we will leave a message.  However, if you are feeling well and you are not experiencing any problems, there is no need to return our call.  We will assume that you have returned to your regular daily activities without incident.  If any biopsies were taken you will be contacted by phone or by letter within the next 1-3 weeks.  Please call us at [phone] if you have not heard about the biopsies in 3 weeks.    SIGNATURES/CONFIDENTIALITY: You and/or your care partner have signed paperwork which will be entered into your electronic medical record.  These signatures attest to the fact that that the information above on your After Visit Summary has been reviewed and is understood.  Full responsibility of the confidentiality of this discharge information lies with you and/or your care-partner.    Handouts were given to your care partner on  diverticulosis and a high fiber diet with liberal fluid intake You may resume   your current medications today. Please call if any questions or concerns.   

## 2015-03-18 NOTE — Op Note (Signed)
[facility] [address] [place], [address]   COLONOSCOPY PROCEDURE REPORT  PATIENT: Renee Rodriguez, Renee Rodriguez  MR#: [id number] BIRTHDATE: [birth year] , 64  yrs. old GENDER: female ENDOSCOPIST: Roxy Cedar, MD REFERRED XB:JYNWG Mani, PA-C PROCEDURE DATE:  03/18/2015 PROCEDURE:   Colonoscopy, screening First Screening Colonoscopy - Avg.  risk and is 50 yrs.  old or older Yes.  Prior Negative Screening - Now for repeat screening. N/A  History of Adenoma - Now for follow-up colonoscopy & has been > or = to 3 yrs.  N/A  Polyps removed today? No Recommend repeat exam, <10 yrs? No ASA CLASS:   Class II INDICATIONS:Screening for colonic neoplasia and Colorectal Neoplasm Risk Assessment for this procedure is average risk. MEDICATIONS: Monitored anesthesia care and Propofol 200 mg IV  DESCRIPTION OF PROCEDURE:   After the risks benefits and alternatives of the procedure were thoroughly explained, informed consent was obtained.  The digital rectal exam revealed no abnormalities of the rectum.   The LB [id number]  endoscope was introduced through the anus and advanced to the cecum, which was identified by both the appendix and ileocecal valve. No adverse events experienced.   The quality of the prep was excellent. (MiraLax was used)  The instrument was then slowly withdrawn as the colon was fully examined. Estimated blood loss is zero unless otherwise noted in this procedure report.      COLON FINDINGS: There was severe diverticulosis noted throughout the entire examined colon.   The examination was otherwise normal. Retroflexed views revealed no abnormalities. The time to cecum = 3.0 Withdrawal time = 9.8   The scope was withdrawn and the procedure completed. COMPLICATIONS: There were no immediate complications.  ENDOSCOPIC IMPRESSION: 1.   Severe diverticulosis was noted throughout the entire examined colon 2.   The examination was otherwise  normal  RECOMMENDATIONS: Continue current colorectal screening recommendations for "routine risk" patients with a repeat colonoscopy in 10 years.  eSigned:  Roxy Cedar, MD 03/18/2015 2:29 PM   cc: The Patient  ; Wallis Bamberg, [facility])

## 2015-05-22 MED FILL — LISINOPRIL-HCTZ 10-12.5 MG: 10-12.5 | 90 days supply | Qty: 90 | Fill #1

## 2015-05-22 MED FILL — ATORVASTATIN 10 MG TABLET: 10 | 90 days supply | Qty: 90 | Fill #1

## 2015-10-12 ENCOUNTER — Other Ambulatory Visit: Payer: Self-pay | Admitting: Urgent Care

## 2015-10-12 DIAGNOSIS — I10 Essential (primary) hypertension: Secondary | ICD-10-CM

## 2015-10-22 MED FILL — ATORVASTATIN 10 MG TABLET: 10 | 90 days supply | Qty: 90 | Fill #2

## 2015-12-03 ENCOUNTER — Ambulatory Visit (INDEPENDENT_AMBULATORY_CARE_PROVIDER_SITE_OTHER): Payer: 59 | Admitting: Physician Assistant

## 2015-12-03 ENCOUNTER — Encounter: Payer: Self-pay | Admitting: Physician Assistant

## 2015-12-03 VITALS — BP 136/84 | HR 84 | Temp 98.4°F | Resp 18 | Ht 61.0 in | Wt 130.0 lb

## 2015-12-03 DIAGNOSIS — I10 Essential (primary) hypertension: Secondary | ICD-10-CM | POA: Diagnosis not present

## 2015-12-03 DIAGNOSIS — M67911 Unspecified disorder of synovium and tendon, right shoulder: Secondary | ICD-10-CM

## 2015-12-03 DIAGNOSIS — E785 Hyperlipidemia, unspecified: Secondary | ICD-10-CM

## 2015-12-03 MED ORDER — ATORVASTATIN CALCIUM 10 MG PO TABS: 10.0000 mg | ORAL_TABLET | Freq: Every day | ORAL | 3 refills | Status: DC

## 2015-12-03 MED ORDER — LISINOPRIL-HYDROCHLOROTHIAZIDE 10-12.5 MG PO TABS: 1.0000 | ORAL_TABLET | Freq: Every day | ORAL | 1 refills | Status: DC

## 2015-12-03 MED FILL — LISINOPRIL-HCTZ 10-12.5 MG: 10-12.5 | 90 days supply | Qty: 90 | Fill #0

## 2015-12-03 NOTE — Addendum Note (Signed)
Addended by: Lisabeth PickHENDRICKS, Jennalee Greaves L on: 12/03/2015 08:52 AM   Modules accepted: Kipp BroodSmartSet

## 2015-12-03 NOTE — Patient Instructions (Addendum)
Come back in 6 months for a BP check.  If you are about run out and can't make it to the office please call for a 30 day refill.  If your arm is not better after two weeks of immobilization and 1000 mg of Tylenol every 8 hours, then call the office and I will refer you to PT or orthopedics for further eval and management.   Please come back in two months for an annual physical.     IF you received an x-ray today, you will receive an invoice from [facility]. Please contact [facility] at [phone] with questions or concerns regarding your invoice.   IF you received labwork today, you will receive an invoice from [facility]. Please contact [facility] at [phone] with questions or concerns regarding your invoice.   Our billing staff will not be able to assist you with questions regarding bills from these companies.  You will be contacted with the lab results as soon as they are available. The fastest way to get your results is to activate your My Chart account. Instructions are located on the last page of this paperwork. If you have not heard from us regarding the results in 2 weeks, please contact this office.

## 2015-12-03 NOTE — Progress Notes (Signed)
12/03/2015 8:15 AM   DOB: 09/16/1950 / MRN: [id number]  SUBJECTIVE:  Renee Rodriguez is a 65 y.o. female presenting for medication refills for blood pressure and cholesterol medication.  She was last here for same on 01/15/2015 and was asymptomatic at that time. Patient has had hypertension for about 5 years.  She has not had her medication for the past month.  Patient no longer smokes but has a (2.5 pack year history). Denies nausea, chest pain shortness of breath with exertion.  Patient has no family history of diabetes but thinks her cousin may have had diabetes.   Shoulder: Right sided radiating shoulder pain (to her left neck) onset 1 week ago worsened when having to raise her arm or push on things. She denies any numbness in her fingertips, change in strength or dexterity.  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 She denies any past shoulder pain. She took 2 tylenol PM's last night to alleviate her pain.      Immunization History  Administered Date(s) Administered  . Influenza Split 10/30/2014  . Influenza-Unspecified 11/19/2012    She is allergic to shellfish allergy.   She  has a past medical history of Anemia; Arthritis; GERD (gastroesophageal reflux disease); and Hypertension.    She  reports that she has been smoking Cigarettes.  She has never used smokeless tobacco. She reports that she drinks about 1.8 oz of alcohol per week . She reports that she does not use drugs. She  has no sexual activity history on file. 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  Her family history includes Alcohol abuse in her mother; Asthma in her mother; Cirrhosis in her mother; Hypertension in her mother and sister; Kidney disease in her mother; Stroke in her mother.  Review of Systems   Constitutional: Negative for chills and fever.  Eyes: Negative for photophobia, pain and discharge.  Cardiovascular: Negative for chest pain.  Gastrointestinal: Negative for nausea and vomiting.  Musculoskeletal: Positive for joint pain and myalgias.    The problem list and medications were reviewed and updated by myself where necessary and exist elsewhere in the encounter.   OBJECTIVE:  BP 136/84 (BP Location: Right Arm, Patient Position: Sitting, Cuff Size: Small)   Pulse 84   Temp 98.4 F (36.9 C) (Oral)   Resp 18   Ht 5\' 1"  (1.549 m)   Wt 130 lb (59 kg)   SpO2 99%   BMI 24.56 kg/m   Physical Exam  HENT:  TMs pearly bilaterally.   Cardiovascular: Normal rate, regular rhythm and normal heart sounds.   No murmur heard. carotid without bruit bilaterally.   Pulmonary/Chest: She has no rales.  Lungs clear bilaterally, no rales, no rhonchi.   Abdominal: Bowel sounds are normal.  Musculoskeletal:  Right arm: positive speeds test, positive neers test, positive hawkins kennedy.    Neurological:  Cranial nerves intact.      Lab Results  Component Value Date   TSH 0.874 12/31/2014   Lab Results  Component Value Date   CHOL 252 (H) 12/31/2014   HDL 57 12/31/2014   LDLCALC 173 (H) 12/31/2014   LDLDIRECT 173 (H) 10/19/2011   TRIG 110 12/31/2014   CHOLHDL 4.4 12/31/2014   Lab Results  Component Value Date   CREATININE 0.49 (L) 12/31/2014   Lab Results  Component Value Date  K 3.9 12/31/2014     No results found for this or any previous visit (from the past 72 hour(s)).  No results found.  ASSESSMENT AND PLAN  Tyjae was seen today for medication refill and shoulder pain.  Diagnoses and all orders for this visit:  Essential hypertension: Patient out of medication for about 1 month now.  Will restart. She is asymptomatic today.  -     lisinopril-hydrochlorothiazide (PRINZIDE,ZESTORETIC) 10-12.5 MG tablet; Take 1 tablet by mouth daily.  Dyslipidemia -      atorvastatin (LIPITOR) 10 MG tablet; Take 1 tablet (10 mg total) by mouth daily.  Tendinopathy of right rotator cuff: This is new and will likely resolve with conservative management. Tylenol 1000 mg q8 and immobilizing the shoulder for two weeks.  No radicular symptoms so doubt a cervical pathology.       The patient is advised to call or return to clinic if she does not see an improvement in symptoms, or to seek the care of the closest emergency department if she worsens with the above plan.   Deliah Boston, MHS, PA-C [facility] 12/03/2015 8:15 AM

## 2016-03-27 ENCOUNTER — Encounter (HOSPITAL_COMMUNITY): Payer: Self-pay

## 2016-03-27 ENCOUNTER — Emergency Department (HOSPITAL_COMMUNITY)
Admission: EM | Admit: 2016-03-27 | Discharge: 2016-03-27 | Disposition: A | Discharge status: Home / Self Care | Payer: 59 | Attending: Emergency Medicine | Admitting: Emergency Medicine

## 2016-03-27 DIAGNOSIS — Z79899 Other long term (current) drug therapy: Secondary | ICD-10-CM | POA: Insufficient documentation

## 2016-03-27 DIAGNOSIS — R0789 Other chest pain: Secondary | ICD-10-CM | POA: Insufficient documentation

## 2016-03-27 DIAGNOSIS — M546 Pain in thoracic spine: Secondary | ICD-10-CM | POA: Diagnosis not present

## 2016-03-27 DIAGNOSIS — M542 Cervicalgia: Secondary | ICD-10-CM | POA: Diagnosis not present

## 2016-03-27 DIAGNOSIS — F17210 Nicotine dependence, cigarettes, uncomplicated: Secondary | ICD-10-CM | POA: Insufficient documentation

## 2016-03-27 DIAGNOSIS — Z96652 Presence of left artificial knee joint: Secondary | ICD-10-CM | POA: Insufficient documentation

## 2016-03-27 DIAGNOSIS — I10 Essential (primary) hypertension: Secondary | ICD-10-CM | POA: Diagnosis not present

## 2016-03-27 MED ORDER — NAPROXEN 500 MG PO TABS: 500.0000 mg | ORAL_TABLET | Freq: Two times a day (BID) | ORAL | 0 refills | Status: DC

## 2016-03-27 MED ORDER — METHOCARBAMOL 500 MG PO TABS: 500.0000 mg | ORAL_TABLET | Freq: Two times a day (BID) | ORAL | 0 refills | Status: DC
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
## 2016-03-27 NOTE — Discharge Instructions (Signed)
Please take robaxin (muscle relaxer) three times a day for muscle tightness and spasms, avoid drinking alcohol or driving a vehicle 45min to 1 hour of taking this medication as it may cause drowsiness.   Please take naproxen three times a day for inflammation and pain.   Rest, ice and mild stretching will help reduce muscle spasms and tightness.

## 2016-03-28 MED FILL — METHOCARBAMOL 500 MG TABLET: 500 | 10 days supply | Qty: 20 | Fill #0

## 2016-03-28 MED FILL — NAPROXEN 500 MG TABLET: 500 | 15 days supply | Qty: 30 | Fill #0

## 2017-03-03 DIAGNOSIS — Z23 Encounter for immunization: Secondary | ICD-10-CM | POA: Diagnosis not present

## 2017-06-23 ENCOUNTER — Ambulatory Visit (HOSPITAL_COMMUNITY)
Admission: EM | Admit: 2017-06-23 | Discharge: 2017-06-23 | Disposition: A | Discharge status: Home / Self Care | Payer: Medicare Other | Attending: Family Medicine | Admitting: Family Medicine

## 2017-06-23 ENCOUNTER — Encounter (HOSPITAL_COMMUNITY): Payer: Self-pay | Admitting: Family Medicine

## 2017-06-23 DIAGNOSIS — I10 Essential (primary) hypertension: Secondary | ICD-10-CM

## 2017-06-23 MED ORDER — LISINOPRIL-HYDROCHLOROTHIAZIDE 10-12.5 MG PO TABS: 1.0000 | ORAL_TABLET | Freq: Every day | ORAL | 3 refills | Status: DC

## 2017-06-23 NOTE — ED Triage Notes (Signed)
Pt here for HTN. She went to a free blood pressure screening clinic and her pressure was 190/102. Here it is 198/123. She was on BP meds months ago but stopped taking them. Denies nay chest pain, SOB, dizziness. Slight headache.

## 2017-06-23 NOTE — ED Provider Notes (Signed)
[facility]   [id number] 06/23/17 Arrival Time: 1100   SUBJECTIVE:  Renee Rodriguez is a 67 y.o. female who presents to the urgent care with complaint of HTN. She went to a free blood pressure screening clinic and her pressure was 190/102. Here it is 198/123. She was on BP meds months ago but stopped taking them. Denies nay chest pain, SOB, dizziness. Slight headache for the last 20 minutes.  Past Medical History:  Diagnosis Date  . Anemia   . Arthritis   . GERD (gastroesophageal reflux disease)   . Hypertension    Family History  Problem Relation Age of Onset  . Stroke Mother        Cerebral hemorrhage  . Asthma Mother   . Alcohol abuse Mother   . Kidney disease Mother   . Hypertension Mother   . Cirrhosis Mother   . Hypertension Sister   . Colon cancer Neg Hx    Social History   Socioeconomic History  . Marital status: Legally Separated    Spouse name: Not on file  . Number of children: Not on file  . Years of education: Not on file  . Highest education level: Not on file  Occupational History  . Occupation: Building control surveyor  Social Needs  . Financial resource strain: Not on file  . Food insecurity:    Worry: Not on file    Inability: Not on file  . Transportation needs:    Medical: Not on file    Non-medical: Not on file  Tobacco Use  . Smoking status: Current Some Day Smoker    Types: Cigarettes    Last attempt to quit: 02/29/1996    Years since quitting: 21.3  . Smokeless tobacco: Never Used  Substance and Sexual Activity  . Alcohol use: Yes    Alcohol/week: 1.8 oz    Types: 3 Standard drinks or equivalent per week    Comment: occasionally beer  . Drug use: No  . Sexual activity: Not on file  Lifestyle  . Physical activity:    Days per week: Not on file    Minutes per session: Not on file  . Stress: Not on file  Relationships  . Social connections:    Talks on phone: Not on file    Gets together: Not on file    Attends religious  service: Not on file    Active member of club or organization: Not on file    Attends meetings of clubs or organizations: Not on file    Relationship status: Not on file  . Intimate partner violence:    Fear of current or ex partner: Not on file    Emotionally abused: Not on file    Physically abused: Not on file    Forced sexual activity: Not on file  Other Topics Concern  . Not on file  Social History Narrative  . Not on file   No outpatient medications have been marked as taking for the 06/23/17 encounter [facility] Encounter).   Allergies  Allergen Reactions  . Shellfish Allergy Anaphylaxis      ROS: As per HPI, remainder of ROS negative.   OBJECTIVE:   Vitals:   06/23/17 1155  BP: (!) 198/123  Pulse: 68  Resp: 18  Temp: 98.5 F (36.9 C)  SpO2: 100%     General appearance: alert; no distress Eyes: PERRL; EOMI; conjunctiva normal HENT: normocephalic; atraumatic; TMs normal, canal normal, external ears normal without trauma; nasal mucosa normal; oral mucosa normal  Neck: supple Lungs: clear to auscultation bilaterally Heart: regular rate and rhythm Back: no CVA tenderness Extremities: no cyanosis or edema; symmetrical with no gross deformities Skin: warm and dry Neurologic: normal gait; grossly normal Psychological: alert and cooperative; normal mood and affect      Labs:  Results for orders placed or performed in visit on 12/31/14  CBC  Result Value Ref Range   WBC 7.9 4.0 - 10.5 K/uL   RBC 4.87 3.87 - 5.11 MIL/uL   Hemoglobin 14.0 12.0 - 15.0 g/dL   HCT 16.141.1 09.636.0 - 04.546.0 %   MCV 84.4 78.0 - 100.0 fL   MCH 28.7 26.0 - 34.0 pg   MCHC 34.1 30.0 - 36.0 g/dL   RDW 40.914.3 81.111.5 - 91.415.5 %   Platelets 398 150 - 400 K/uL   MPV 9.3 8.6 - 12.4 fL  Comprehensive metabolic panel  Result Value Ref Range   Sodium 136 135 - 146 mmol/L   Potassium 3.9 3.5 - 5.3 mmol/L   Chloride 104 98 - 110 mmol/L   CO2 25 20 - 31 mmol/L   Glucose, Bld 86 65 - 99 mg/dL   BUN  20 7 - 25 mg/dL   Creat 7.820.49 (L) 9.560.50 - 0.99 mg/dL   Total Bilirubin 0.3 0.2 - 1.2 mg/dL   Alkaline Phosphatase 103 33 - 130 U/L   AST 19 10 - 35 U/L   ALT 14 6 - 29 U/L   Total Protein 7.5 6.1 - 8.1 g/dL   Albumin 4.1 3.6 - 5.1 g/dL   Calcium 9.9 8.6 - 21.310.4 mg/dL  Lipid panel  Result Value Ref Range   Cholesterol 252 (H) 125 - 200 mg/dL   Triglycerides 086110 <578<150 mg/dL   HDL 57 >=46>=46 mg/dL   Total CHOL/HDL Ratio 4.4 <=5.0 Ratio   VLDL 22 <30 mg/dL   LDL Cholesterol 962173 (H) <130 mg/dL  TSH  Result Value Ref Range   TSH 0.874 0.350 - 4.500 uIU/mL  POCT urinalysis dipstick  Result Value Ref Range   Color, UA light yellow (A) yellow   Clarity, UA cloudy (A) clear   Glucose, UA negative negative   Bilirubin, UA negative negative   Ketones, POC UA negative negative   Spec Grav, UA 1.020    Blood, UA trace-intact (A) negative   pH, UA 7.0    Protein Ur, POC negative negative   Urobilinogen, UA 0.2    Nitrite, UA Negative Negative   Leukocytes, UA Trace (A) Negative    Labs Reviewed - No data to display  No results found.     ASSESSMENT & PLAN:  1. Essential hypertension     Meds ordered this encounter  Medications  . lisinopril-hydrochlorothiazide (PRINZIDE,ZESTORETIC) 10-12.5 MG tablet    Sig: Take 1 tablet by mouth daily.    Dispense:  90 tablet    Refill:  3    Reviewed expectations re: course of current medical issues. Questions answered. Outlined signs and symptoms indicating need for more acute intervention. Patient verbalized understanding. After Visit Summary given.    Procedures:      Elvina SidleLauenstein, Zhanae Proffit, MD 06/23/17 1203

## 2017-07-20 ENCOUNTER — Encounter (HOSPITAL_COMMUNITY): Payer: Self-pay | Admitting: Emergency Medicine

## 2017-07-20 ENCOUNTER — Emergency Department (HOSPITAL_COMMUNITY): Payer: Medicare Other

## 2017-07-20 ENCOUNTER — Emergency Department (HOSPITAL_COMMUNITY)
Admission: EM | Admit: 2017-07-20 | Discharge: 2017-07-20 | Disposition: A | Discharge status: Home / Self Care | Payer: Medicare Other | Attending: Emergency Medicine | Admitting: Emergency Medicine

## 2017-07-20 DIAGNOSIS — S0990XA Unspecified injury of head, initial encounter: Secondary | ICD-10-CM | POA: Diagnosis not present

## 2017-07-20 DIAGNOSIS — S3991XA Unspecified injury of abdomen, initial encounter: Secondary | ICD-10-CM | POA: Diagnosis not present

## 2017-07-20 DIAGNOSIS — S199XXA Unspecified injury of neck, initial encounter: Secondary | ICD-10-CM | POA: Diagnosis not present

## 2017-07-20 DIAGNOSIS — F17210 Nicotine dependence, cigarettes, uncomplicated: Secondary | ICD-10-CM | POA: Insufficient documentation

## 2017-07-20 DIAGNOSIS — R109 Unspecified abdominal pain: Secondary | ICD-10-CM | POA: Diagnosis not present

## 2017-07-20 DIAGNOSIS — R079 Chest pain, unspecified: Secondary | ICD-10-CM | POA: Diagnosis not present

## 2017-07-20 DIAGNOSIS — Y939 Activity, unspecified: Secondary | ICD-10-CM | POA: Insufficient documentation

## 2017-07-20 DIAGNOSIS — S20309A Unspecified superficial injuries of unspecified front wall of thorax, initial encounter: Secondary | ICD-10-CM | POA: Diagnosis present

## 2017-07-20 DIAGNOSIS — R51 Headache: Secondary | ICD-10-CM | POA: Insufficient documentation

## 2017-07-20 DIAGNOSIS — M542 Cervicalgia: Secondary | ICD-10-CM | POA: Diagnosis not present

## 2017-07-20 DIAGNOSIS — S20219A Contusion of unspecified front wall of thorax, initial encounter: Secondary | ICD-10-CM | POA: Insufficient documentation

## 2017-07-20 DIAGNOSIS — Y998 Other external cause status: Secondary | ICD-10-CM | POA: Diagnosis not present

## 2017-07-20 DIAGNOSIS — I10 Essential (primary) hypertension: Secondary | ICD-10-CM | POA: Diagnosis not present

## 2017-07-20 DIAGNOSIS — Y92410 Unspecified street and highway as the place of occurrence of the external cause: Secondary | ICD-10-CM | POA: Insufficient documentation

## 2017-07-20 DIAGNOSIS — S20212A Contusion of left front wall of thorax, initial encounter: Secondary | ICD-10-CM | POA: Diagnosis not present

## 2017-07-20 DIAGNOSIS — S299XXA Unspecified injury of thorax, initial encounter: Secondary | ICD-10-CM | POA: Diagnosis not present

## 2017-07-20 LAB — CBC WITH DIFFERENTIAL/PLATELET
BASOS PCT: 0 %
Basophils Absolute: 0 10*3/uL (ref 0.0–0.1)
EOS PCT: 1 %
Eosinophils Absolute: 0.1 10*3/uL (ref 0.0–0.7)
HCT: 44.6 % (ref 36.0–46.0)
Hemoglobin: 14.8 g/dL (ref 12.0–15.0)
Lymphocytes Relative: 22 %
Lymphs Abs: 2.6 10*3/uL (ref 0.7–4.0)
MCH: 29.1 pg (ref 26.0–34.0)
MCHC: 33.2 g/dL (ref 30.0–36.0)
MCV: 87.8 fL (ref 78.0–100.0)
Monocytes Absolute: 0.6 10*3/uL (ref 0.1–1.0)
Monocytes Relative: 5 %
NEUTROS PCT: 72 %
Neutro Abs: 8.3 10*3/uL — ABNORMAL HIGH (ref 1.7–7.7)
Platelets: ADEQUATE 10*3/uL (ref 150–400)
RBC: 5.08 MIL/uL (ref 3.87–5.11)
RDW: 13.5 % (ref 11.5–15.5)
WBC: 11.6 10*3/uL — AB (ref 4.0–10.5)

## 2017-07-20 LAB — I-STAT CHEM 8, ED
BUN: 9 mg/dL (ref 6–20)
CALCIUM ION: 1.05 mmol/L — AB (ref 1.15–1.40)
CHLORIDE: 104 mmol/L (ref 101–111)
CREATININE: 0.5 mg/dL (ref 0.44–1.00)
Glucose, Bld: 100 mg/dL — ABNORMAL HIGH (ref 65–99)
HCT: 45 % (ref 36.0–46.0)
Hemoglobin: 15.3 g/dL — ABNORMAL HIGH (ref 12.0–15.0)
Potassium: 3.6 mmol/L (ref 3.5–5.1)
Sodium: 139 mmol/L (ref 135–145)
TCO2: 26 mmol/L (ref 22–32)

## 2017-07-20 LAB — URINALYSIS, ROUTINE W REFLEX MICROSCOPIC
Bilirubin Urine: NEGATIVE
Glucose, UA: NEGATIVE mg/dL
Ketones, ur: NEGATIVE mg/dL
LEUKOCYTES UA: NEGATIVE
NITRITE: NEGATIVE
PH: 7 (ref 5.0–8.0)
Protein, ur: NEGATIVE mg/dL
SPECIFIC GRAVITY, URINE: 1.006 (ref 1.005–1.030)

## 2017-07-20 LAB — COMPREHENSIVE METABOLIC PANEL
ALBUMIN: 4.3 g/dL (ref 3.5–5.0)
ALK PHOS: 88 U/L (ref 38–126)
ALT: 22 U/L (ref 14–54)
ANION GAP: 12 (ref 5–15)
AST: 45 U/L — ABNORMAL HIGH (ref 15–41)
BILIRUBIN TOTAL: 1.2 mg/dL (ref 0.3–1.2)
BUN: 9 mg/dL (ref 6–20)
CALCIUM: 9.5 mg/dL (ref 8.9–10.3)
CO2: 25 mmol/L (ref 22–32)
Chloride: 103 mmol/L (ref 101–111)
Creatinine, Ser: 0.63 mg/dL (ref 0.44–1.00)
GFR calc Af Amer: >60 mL/min (ref >60)
GLUCOSE: 103 mg/dL — AB (ref 65–99)
POTASSIUM: 3.7 mmol/L (ref 3.5–5.1)
Sodium: 140 mmol/L (ref 135–145)
Total Protein: 7.4 g/dL (ref 6.5–8.1)

## 2017-07-20 LAB — ETHANOL

## 2017-07-20 LAB — I-STAT TROPONIN, ED: Troponin i, poc: 0.01 ng/mL (ref 0.00–0.08)

## 2017-07-20 MED ORDER — IOPAMIDOL (ISOVUE-370) INJECTION 76%
100.0000 mL | Freq: Once | INTRAVENOUS | Status: AC | PRN
  Administered 2017-07-20: 100 mL via INTRAVENOUS

## 2017-07-20 MED ORDER — HYDROCODONE-ACETAMINOPHEN 5-325 MG PO TABS: 1.0000 | ORAL_TABLET | Freq: Four times a day (QID) | ORAL | 0 refills | Status: DC | PRN

## 2017-07-20 MED ORDER — MORPHINE SULFATE (PF) 4 MG/ML IV SOLN
4.0000 mg | Freq: Once | INTRAVENOUS | Status: AC
  Administered 2017-07-20: 4 mg via INTRAVENOUS
  Filled 2017-07-20: qty 1

## 2017-07-20 MED ORDER — IOPAMIDOL (ISOVUE-370) INJECTION 76%
INTRAVENOUS | Status: AC
  Filled 2017-07-20: qty 100

## 2017-07-20 MED ORDER — IBUPROFEN 600 MG PO TABS: 600.0000 mg | ORAL_TABLET | Freq: Four times a day (QID) | ORAL | 0 refills | Status: DC | PRN

## 2017-07-20 MED ORDER — HYDRALAZINE HCL 20 MG/ML IJ SOLN
10.0000 mg | Freq: Once | INTRAMUSCULAR | Status: AC
  Administered 2017-07-20: 10 mg via INTRAVENOUS
  Filled 2017-07-20: qty 1

## 2017-07-20 NOTE — ED Notes (Signed)
Pt returned from xray; second RN attempting PIV at this time.

## 2017-07-20 NOTE — ED Notes (Signed)
Pt hypertensive, Dr notified.

## 2017-07-20 NOTE — ED Notes (Signed)
Attempted PIV x2, able to collect blood work but unable to establish PIV; Second RN to attempt.

## 2017-07-20 NOTE — ED Notes (Signed)
Patient transported to CT 
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
## 2017-07-20 NOTE — Discharge Instructions (Signed)
Take motrin for pain.   Take vicodin for severe pain.   Continue your blood pressure medicines   See your doctor. Recheck blood pressure with your doctor in a week   Return to ER if you have severe chest pain, abdominal pain, vomiting.

## 2017-07-20 NOTE — ED Notes (Signed)
Patient transported to X-ray 
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
## 2017-08-08 ENCOUNTER — Encounter (HOSPITAL_COMMUNITY): Payer: Self-pay | Admitting: Family Medicine

## 2017-08-08 ENCOUNTER — Ambulatory Visit (HOSPITAL_COMMUNITY)
Admission: EM | Admit: 2017-08-08 | Discharge: 2017-08-08 | Disposition: A | Discharge status: Home / Self Care | Payer: Medicare Other | Attending: Urgent Care | Admitting: Urgent Care

## 2017-08-08 DIAGNOSIS — S20219A Contusion of unspecified front wall of thorax, initial encounter: Secondary | ICD-10-CM | POA: Diagnosis not present

## 2017-08-08 DIAGNOSIS — R030 Elevated blood-pressure reading, without diagnosis of hypertension: Secondary | ICD-10-CM

## 2017-08-08 DIAGNOSIS — R0789 Other chest pain: Secondary | ICD-10-CM | POA: Diagnosis not present

## 2017-08-08 DIAGNOSIS — I10 Essential (primary) hypertension: Secondary | ICD-10-CM | POA: Diagnosis not present

## 2017-08-08 DIAGNOSIS — M542 Cervicalgia: Secondary | ICD-10-CM

## 2017-08-08 DIAGNOSIS — M62830 Muscle spasm of back: Secondary | ICD-10-CM

## 2017-08-08 MED ORDER — CYCLOBENZAPRINE HCL 5 MG PO TABS: 5.0000 mg | ORAL_TABLET | Freq: Two times a day (BID) | ORAL | 0 refills | Status: DC | PRN

## 2017-08-08 MED ORDER — METHYLPREDNISOLONE ACETATE 80 MG/ML IJ SUSP
INTRAMUSCULAR | Status: AC
  Filled 2017-08-08: qty 1

## 2017-08-08 MED ORDER — LISINOPRIL-HYDROCHLOROTHIAZIDE 20-25 MG PO TABS: 1.0000 | ORAL_TABLET | Freq: Every day | ORAL | 1 refills | Status: DC

## 2017-08-08 MED ORDER — METHYLPREDNISOLONE ACETATE 80 MG/ML IJ SUSP
80.0000 mg | Freq: Once | INTRAMUSCULAR | Status: AC
  Administered 2017-08-08: 80 mg via INTRAMUSCULAR
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
## 2017-08-08 NOTE — Discharge Instructions (Addendum)
Call [phone], [facility] and set up an appointment with Gurney MaxinMike Dev Dhondt, PA-C. We need to follow up on your blood pressure. For now, take 2 tablets of your current blood pressure medication until you can fill the new script I gave you today.

## 2017-08-18 ENCOUNTER — Ambulatory Visit: Payer: Self-pay | Admitting: Urgent Care

## 2017-08-22 ENCOUNTER — Ambulatory Visit: Payer: Self-pay | Admitting: Urgent Care

## 2017-09-25 ENCOUNTER — Encounter: Payer: Self-pay | Admitting: Urgent Care

## 2017-09-25 ENCOUNTER — Ambulatory Visit (INDEPENDENT_AMBULATORY_CARE_PROVIDER_SITE_OTHER): Payer: Medicare Other | Admitting: Urgent Care

## 2017-09-25 VITALS — BP 99/64 | HR 85 | Temp 99.0°F | Resp 16 | Ht 61.0 in | Wt 128.4 lb

## 2017-09-25 DIAGNOSIS — Z709 Sex counseling, unspecified: Secondary | ICD-10-CM | POA: Diagnosis not present

## 2017-09-25 DIAGNOSIS — I10 Essential (primary) hypertension: Secondary | ICD-10-CM

## 2017-09-25 NOTE — Patient Instructions (Addendum)
For your blood pressure medication, start taking 1/2 tablet for a dose lisinopril-hydrochlorothiazide 10-12.5mg . Please check your blood pressure once weekly. Try to check the blood pressure around the same time each day that you check it after a period of resting in a seated position for 5-10 minutes. The readings should be between 110-130 systolic (top number), between 70-90 diastolic (bottom number). If your blood pressure falls outside this range consistently (i.e. 3-4 weeks in a row), then come back for a recheck.   Jen Sincero You Are a Badass: How to Stop Doubting Your Greatness and Start Living an Awesome Life  Melody Beattie Codependent No More: How to Stop Controlling Others and Start Caring for [facility] ([address] [place], [place]) - [phone]  [facility]) - [phone]  [facility] - [phone]  [facility] - [phone]  [facility] - [phone]  [facility]  - [phone] (do not file insurance)     Managing Your Hypertension Hypertension is commonly called high blood pressure. This is when the force of your blood pressing against the walls of your arteries is too strong. Arteries are blood vessels that carry blood from your heart throughout your body. Hypertension forces the heart to work harder to pump blood, and may cause the arteries to become narrow or stiff. Having untreated or uncontrolled hypertension can cause heart attack, stroke, kidney disease, and other problems. What are blood pressure readings? A blood pressure reading consists of a higher number over a lower number. Ideally, your blood pressure should be below 120/80. The first ("top") number is called the systolic pressure. It is a measure of the pressure in your arteries as your heart beats. The second ("bottom") number is called the  diastolic pressure. It is a measure of the pressure in your arteries as the heart relaxes. What does my blood pressure reading mean? Blood pressure is classified into four stages. Based on your blood pressure reading, your health care provider may use the following stages to determine what type of treatment you need, if any. Systolic pressure and diastolic pressure are measured in a unit called mm Hg. Normal  Systolic pressure: below 120.  Diastolic pressure: below 80. Elevated  Systolic pressure: 120-129.  Diastolic pressure: below 80. Hypertension stage 1  Systolic pressure: 130-139.  Diastolic pressure: 80-89. Hypertension stage 2  Systolic pressure: 140 or above.  Diastolic pressure: 90 or above. What health risks are associated with hypertension? Managing your hypertension is an important responsibility. Uncontrolled hypertension can lead to:  A heart attack.  A stroke.  A weakened blood vessel (aneurysm).  Heart failure.  Kidney damage.  Eye damage.  Metabolic syndrome.  Memory and concentration problems.  What changes can I make to manage my hypertension? Hypertension can be managed by making lifestyle changes and possibly by taking medicines. Your health care provider will help you make a plan to bring your blood pressure within a normal range. Eating and drinking  Eat a diet that is high in fiber and potassium, and low in salt (sodium), added sugar, and fat. An example eating plan is called the DASH (Dietary Approaches to Stop Hypertension) diet. To eat this way: ? Eat plenty of fresh fruits and vegetables. Try to fill half of your plate at each meal with fruits and vegetables. ? Eat whole grains, such as whole wheat pasta, brown rice, or whole grain bread. Fill about one quarter  of your plate with whole grains. ? Eat low-fat diary products. ? Avoid fatty cuts of meat, processed or cured meats, and poultry with skin. Fill about one quarter of your plate with  lean proteins such as fish, chicken without skin, beans, eggs, and tofu. ? Avoid premade and processed foods. These tend to be higher in sodium, added sugar, and fat.  Reduce your daily sodium intake. Most people with hypertension should eat less than 1,500 mg of sodium a day.  Limit alcohol intake to no more than 1 drink a day for nonpregnant women and 2 drinks a day for men. One drink equals 12 oz of beer, 5 oz of wine, or 1 oz of hard liquor. Lifestyle  Work with your health care provider to maintain a healthy body weight, or to lose weight. Ask what an ideal weight is for you.  Get at least 30 minutes of exercise that causes your heart to beat faster (aerobic exercise) most days of the week. Activities may include walking, swimming, or biking.  Include exercise to strengthen your muscles (resistance exercise), such as weight lifting, as part of your weekly exercise routine. Try to do these types of exercises for 30 minutes at least 3 days a week.  Do not use any products that contain nicotine or tobacco, such as cigarettes and e-cigarettes. If you need help quitting, ask your health care provider.  Control any long-term (chronic) conditions you have, such as high cholesterol or diabetes. Monitoring  Monitor your blood pressure at home as told by your health care provider. Your personal target blood pressure may vary depending on your medical conditions, your age, and other factors.  Have your blood pressure checked regularly, as often as told by your health care provider. Working with your health care provider  Review all the medicines you take with your health care provider because there may be side effects or interactions.  Talk with your health care provider about your diet, exercise habits, and other lifestyle factors that may be contributing to hypertension.  Visit your health care provider regularly. Your health care provider can help you create and adjust your plan for  managing hypertension. Will I need medicine to control my blood pressure? Your health care provider may prescribe medicine if lifestyle changes are not enough to get your blood pressure under control, and if:  Your systolic blood pressure is 130 or higher.  Your diastolic blood pressure is 80 or higher.  Take medicines only as told by your health care provider. Follow the directions carefully. Blood pressure medicines must be taken as prescribed. The medicine does not work as well when you skip doses. Skipping doses also puts you at risk for problems. Contact a health care provider if:  You think you are having a reaction to medicines you have taken.  You have repeated (recurrent) headaches.  You feel dizzy.  You have swelling in your ankles.  You have trouble with your vision. Get help right away if:  You develop a severe headache or confusion.  You have unusual weakness or numbness, or you feel faint.  You have severe pain in your chest or abdomen.  You vomit repeatedly.  You have trouble breathing. Summary  Hypertension is when the force of blood pumping through your arteries is too strong. If this condition is not controlled, it may put you at risk for serious complications.  Your personal target blood pressure may vary depending on your medical conditions, your age, and other factors. For  most people, a normal blood pressure is less than 120/80.  Hypertension is managed by lifestyle changes, medicines, or both. Lifestyle changes include weight loss, eating a healthy, low-sodium diet, exercising more, and limiting alcohol. This information is not intended to replace advice given to you by your health care provider. Make sure you discuss any questions you have with your health care provider. Document Released: 11/09/2011 Document Revised: 01/13/2016 Document Reviewed: 01/13/2016 Elsevier Interactive Patient Education  2018 [employer].     Safe Sex Practicing safe  sex means taking steps before and during sex to reduce your risk of:  Getting an STD (sexually transmitted disease).  Giving your partner an STD.  Unwanted pregnancy.  How can I practice safe sex?  To practice safe sex:  Limit your sexual partners to only one partner who is having sex with only you.  Avoid using alcohol and recreational drugs before having sex. These substances can affect your judgment.  Before having sex with a new partner: ? Talk to your partner about past partners, past STDs, and drug use. ? You and your partner should be screened for STDs and discuss the results with each other.  Check your body regularly for sores, blisters, rashes, or unusual discharge. If you notice any of these problems, visit your health care provider.  If you have symptoms of an infection or you are being treated for an STD, avoid sexual contact.  While having sex, use a condom. Make sure to: ? Use a condom every time you have vaginal, oral, or anal sex. Both females and males should wear condoms during oral sex. ? Keep condoms in place from the beginning to the end of sexual activity. ? Use a latex condom, if possible. Latex condoms offer the best protection. ? Use only water-based lubricants or oils to lubricate a condom. Using petroleum-based lubricants or oils will weaken the condom and increase the chance that it will break.  See your health care provider for regular screenings, exams, and tests for STDs.  Talk with your health care provider about the form of birth control (contraception) that is best for you.  Get vaccinated against hepatitis B and human papillomavirus (HPV).  If you are at risk of being infected with HIV (human immunodeficiency virus), talk with your health care provider about taking a prescription medicine to prevent HIV infection. You are considered at risk for HIV if: ? You are a man who has sex with other men. ? You are a heterosexual man or woman who is  sexually active with more than one partner. ? You take drugs by injection. ? You are sexually active with a partner who has HIV.  This information is not intended to replace advice given to you by your health care provider. Make sure you discuss any questions you have with your health care provider. Document Released: 03/24/2004 Document Revised: 07/01/2015 Document Reviewed: 01/04/2015 Elsevier Interactive Patient Education  2018 [employer].    IF you received an x-ray today, you will receive an invoice from [facility]. Please contact [facility] at [phone] with questions or concerns regarding your invoice.   IF you received labwork today, you will receive an invoice from [facility]. Please contact [facility] at [phone] with questions or concerns regarding your invoice.   Our billing staff will not be able to assist you with questions regarding bills from these companies.  You will be contacted with the lab results as soon as they are available. The fastest way to get  your results is to activate your My Chart account. Instructions are located on the last page of this paperwork. If you have not heard from [place] regarding the results in 2 weeks, please contact this office.

## 2017-09-25 NOTE — Progress Notes (Signed)
MRN: [id number] DOB: 08-18-1950  Subjective:   Renee Rodriguez is a 67 y.o. female presenting for follow up on Hypertension. Currently managed with lisinopril hydrochlorothiazide at 20-25mg . Avoids salt in diet, is staying active. Denies dizziness, chronic headache, blurred vision, chest pain, shortness of breath, heart racing, palpitations, nausea, vomiting, abdominal pain, hematuria, lower leg swelling.  Has about 1 drink per day, has an occasional cigarette.  Patient would also like to discuss relationships and sex.  She had a divorce several years ago, approximately 2015.  She has been single since then but is starting to think about being in a relationship.  She has a friend that she has been talking to him and has discussed having sex with them.  She is not necessarily interested in sex at the moment but her friend has talked with her about this.  She is worried about whether or not she is too old to have sex where she does not have interest in it right now.  She feels like she spent a lot of time taking care of many people around her and no longer wants to be the one that people depend on.  She is not currently talking to a therapist.  Denies ever testing positive for STI.  Angeletta has a current medication list which includes the following prescription(s): cyclobenzaprine and lisinopril-hydrochlorothiazide. Also is allergic to shellfish allergy.  Hasna  has a past medical history of Anemia, Arthritis, GERD (gastroesophageal reflux disease), and Hypertension. 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  Objective:   Vitals: BP 99/64   Pulse 85   Temp 99 F (37.2 C) (Oral)   Resp 16   Ht 5\' 1"  (1.549 m)   Wt 128 lb 6.4 oz (58.2 kg)   SpO2 100%   BMI 24.26 kg/m   BP Readings from Last 3 Encounters:  09/25/17 99/64  08/08/17 (!) 199/108   07/20/17 (!) 171/98   Physical Exam  Constitutional: She is oriented to person, place, and time. She appears well-developed and well-nourished.  HENT:  Mouth/Throat: Oropharynx is clear and moist.  Eyes: Pupils are equal, round, and reactive to light. EOM are normal. No scleral icterus.  Cardiovascular: Normal rate, regular rhythm, normal heart sounds and intact distal pulses. Exam reveals no gallop and no friction rub.  No murmur heard. Pulmonary/Chest: Effort normal and breath sounds normal. No stridor. No respiratory distress. She has no wheezes. She has no rales.  Neurological: She is alert and oriented to person, place, and time.  Skin: Skin is warm and dry.  Psychiatric: She has a normal mood and affect.   Assessment and Plan :   Essential hypertension - Plan: Lipid panel, Comprehensive metabolic panel, CANCELED: Microalbumin / creatinine urine ratio  Sex counseling  Patient has slight hypotension but is asymptomatic.  She has made significant strides to try and improve her blood pressure.  I will have her have her current medication dose so that she is taking lisinopril hydrochlorothiazide at a dose of 10-12.5 mg.  I also counseled patient on safe sex, provided her with a list of books to read for general life counseling.  Recommended that she seek behavioral therapy so that she can meet her mental health goals.  Patient is excited about this prospect, will follow up in 3 months or sooner as dictated by her blood pressure monitoring parameters.  Wallis Bamberg, PA-C Primary  [facility] at [facility] [phone] 09/25/2017  3:40 PM

## 2017-09-26 ENCOUNTER — Other Ambulatory Visit: Payer: Self-pay | Admitting: Urgent Care

## 2017-09-26 LAB — COMPREHENSIVE METABOLIC PANEL
A/G RATIO: 1.5 (ref 1.2–2.2)
ALK PHOS: 107 IU/L (ref 39–117)
ALT: 16 IU/L (ref 0–32)
AST: 19 IU/L (ref 0–40)
Albumin: 4.4 g/dL (ref 3.6–4.8)
BILIRUBIN TOTAL: 0.7 mg/dL (ref 0.0–1.2)
BUN / CREAT RATIO: 19 (ref 12–28)
BUN: 13 mg/dL (ref 8–27)
CO2: 23 mmol/L (ref 20–29)
CREATININE: 0.67 mg/dL (ref 0.57–1.00)
Calcium: 9.3 mg/dL (ref 8.7–10.3)
Chloride: 101 mmol/L (ref 96–106)
GFR calc Af Amer: 107 mL/min/{1.73_m2} (ref >59)
GFR calc non Af Amer: 93 mL/min/{1.73_m2} (ref >59)
GLOBULIN, TOTAL: 3 g/dL (ref 1.5–4.5)
Glucose: 111 mg/dL — ABNORMAL HIGH (ref 65–99)
POTASSIUM: 3.9 mmol/L (ref 3.5–5.2)
SODIUM: 140 mmol/L (ref 134–144)
Total Protein: 7.4 g/dL (ref 6.0–8.5)

## 2017-09-26 LAB — LIPID PANEL
CHOLESTEROL TOTAL: 269 mg/dL — AB (ref 100–199)
Chol/HDL Ratio: 3.9 ratio (ref 0.0–4.4)
HDL: 69 mg/dL (ref >39)
LDL Calculated: 165 mg/dL — ABNORMAL HIGH (ref 0–99)
TRIGLYCERIDES: 173 mg/dL — AB (ref 0–149)
VLDL CHOLESTEROL CAL: 35 mg/dL (ref 5–40)

## 2017-09-26 MED ORDER — ATORVASTATIN CALCIUM 20 MG PO TABS: 20.0000 mg | ORAL_TABLET | Freq: Every day | ORAL | 3 refills | Status: DC

## 2017-09-26 NOTE — Progress Notes (Signed)
a 

## 2017-12-18 DIAGNOSIS — Z23 Encounter for immunization: Secondary | ICD-10-CM | POA: Diagnosis not present

## 2018-03-26 ENCOUNTER — Ambulatory Visit: Payer: Medicare Other | Admitting: Family

## 2018-03-26 DIAGNOSIS — Z0289 Encounter for other administrative examinations: Secondary | ICD-10-CM

## 2018-06-29 ENCOUNTER — Ambulatory Visit: Payer: Medicare Other | Admitting: Family
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
## 2019-01-19 DIAGNOSIS — Z23 Encounter for immunization: Secondary | ICD-10-CM | POA: Diagnosis not present

## 2019-03-05 ENCOUNTER — Inpatient Hospital Stay (HOSPITAL_COMMUNITY): Payer: Medicare Other

## 2019-03-05 ENCOUNTER — Emergency Department (HOSPITAL_COMMUNITY): Payer: Medicare Other

## 2019-03-05 ENCOUNTER — Inpatient Hospital Stay (HOSPITAL_COMMUNITY)
Admission: EM | Admit: 2019-03-05 | Discharge: 2019-03-07 | DRG: 041 | Disposition: A | Discharge status: Home / Self Care | Payer: Medicare Other | Attending: Neurology | Admitting: Neurology

## 2019-03-05 DIAGNOSIS — Z91013 Allergy to seafood: Secondary | ICD-10-CM

## 2019-03-05 DIAGNOSIS — Z823 Family history of stroke: Secondary | ICD-10-CM | POA: Diagnosis not present

## 2019-03-05 DIAGNOSIS — G4733 Obstructive sleep apnea (adult) (pediatric): Secondary | ICD-10-CM | POA: Diagnosis present

## 2019-03-05 DIAGNOSIS — Z96652 Presence of left artificial knee joint: Secondary | ICD-10-CM | POA: Diagnosis present

## 2019-03-05 DIAGNOSIS — Z20822 Contact with and (suspected) exposure to covid-19: Secondary | ICD-10-CM | POA: Diagnosis present

## 2019-03-05 DIAGNOSIS — I63412 Cerebral infarction due to embolism of left middle cerebral artery: Secondary | ICD-10-CM | POA: Diagnosis not present

## 2019-03-05 DIAGNOSIS — Z9114 Patient's other noncompliance with medication regimen: Secondary | ICD-10-CM | POA: Diagnosis not present

## 2019-03-05 DIAGNOSIS — Z811 Family history of alcohol abuse and dependence: Secondary | ICD-10-CM

## 2019-03-05 DIAGNOSIS — Z8249 Family history of ischemic heart disease and other diseases of the circulatory system: Secondary | ICD-10-CM

## 2019-03-05 DIAGNOSIS — I6523 Occlusion and stenosis of bilateral carotid arteries: Secondary | ICD-10-CM | POA: Diagnosis present

## 2019-03-05 DIAGNOSIS — Z8673 Personal history of transient ischemic attack (TIA), and cerebral infarction without residual deficits: Secondary | ICD-10-CM

## 2019-03-05 DIAGNOSIS — K219 Gastro-esophageal reflux disease without esophagitis: Secondary | ICD-10-CM | POA: Diagnosis present

## 2019-03-05 DIAGNOSIS — I739 Peripheral vascular disease, unspecified: Secondary | ICD-10-CM | POA: Diagnosis present

## 2019-03-05 DIAGNOSIS — E785 Hyperlipidemia, unspecified: Secondary | ICD-10-CM | POA: Diagnosis present

## 2019-03-05 DIAGNOSIS — I10 Essential (primary) hypertension: Secondary | ICD-10-CM | POA: Diagnosis present

## 2019-03-05 DIAGNOSIS — I639 Cerebral infarction, unspecified: Secondary | ICD-10-CM

## 2019-03-05 DIAGNOSIS — I6300 Cerebral infarction due to thrombosis of unspecified precerebral artery: Secondary | ICD-10-CM | POA: Diagnosis not present

## 2019-03-05 DIAGNOSIS — R29810 Facial weakness: Secondary | ICD-10-CM | POA: Diagnosis present

## 2019-03-05 DIAGNOSIS — R29706 NIHSS score 6: Secondary | ICD-10-CM | POA: Diagnosis present

## 2019-03-05 DIAGNOSIS — M1990 Unspecified osteoarthritis, unspecified site: Secondary | ICD-10-CM | POA: Diagnosis present

## 2019-03-05 DIAGNOSIS — F17210 Nicotine dependence, cigarettes, uncomplicated: Secondary | ICD-10-CM | POA: Diagnosis present

## 2019-03-05 DIAGNOSIS — Z79899 Other long term (current) drug therapy: Secondary | ICD-10-CM | POA: Diagnosis not present

## 2019-03-05 DIAGNOSIS — I6389 Other cerebral infarction: Secondary | ICD-10-CM | POA: Diagnosis not present

## 2019-03-05 DIAGNOSIS — R471 Dysarthria and anarthria: Secondary | ICD-10-CM | POA: Diagnosis present

## 2019-03-05 DIAGNOSIS — G8191 Hemiplegia, unspecified affecting right dominant side: Secondary | ICD-10-CM | POA: Diagnosis present

## 2019-03-05 DIAGNOSIS — I6309 Cerebral infarction due to thrombosis of other precerebral artery: Secondary | ICD-10-CM | POA: Diagnosis not present

## 2019-03-05 DIAGNOSIS — I6501 Occlusion and stenosis of right vertebral artery: Secondary | ICD-10-CM | POA: Diagnosis present

## 2019-03-05 DIAGNOSIS — I169 Hypertensive crisis, unspecified: Secondary | ICD-10-CM | POA: Diagnosis present

## 2019-03-05 DIAGNOSIS — Z841 Family history of disorders of kidney and ureter: Secondary | ICD-10-CM | POA: Diagnosis not present

## 2019-03-05 DIAGNOSIS — Z825 Family history of asthma and other chronic lower respiratory diseases: Secondary | ICD-10-CM | POA: Diagnosis not present

## 2019-03-05 DIAGNOSIS — R29704 NIHSS score 4: Secondary | ICD-10-CM | POA: Diagnosis present

## 2019-03-05 DIAGNOSIS — F1290 Cannabis use, unspecified, uncomplicated: Secondary | ICD-10-CM | POA: Diagnosis present

## 2019-03-05 LAB — COMPREHENSIVE METABOLIC PANEL
ALT: 28 U/L (ref 0–44)
AST: 43 U/L — ABNORMAL HIGH (ref 15–41)
Albumin: 3.8 g/dL (ref 3.5–5.0)
Alkaline Phosphatase: 99 U/L (ref 38–126)
Anion gap: 15 (ref 5–15)
BUN: 9 mg/dL (ref 8–23)
CO2: 20 mmol/L — ABNORMAL LOW (ref 22–32)
Calcium: 9.3 mg/dL (ref 8.9–10.3)
Chloride: 103 mmol/L (ref 98–111)
Creatinine, Ser: 0.63 mg/dL (ref 0.44–1.00)
GFR calc Af Amer: >60 mL/min (ref >60)
GFR calc non Af Amer: >60 mL/min (ref >60)
Glucose, Bld: 105 mg/dL — ABNORMAL HIGH (ref 70–99)
Potassium: 4 mmol/L (ref 3.5–5.1)
Sodium: 138 mmol/L (ref 135–145)
Total Bilirubin: 1.1 mg/dL (ref 0.3–1.2)
Total Protein: 7.4 g/dL (ref 6.5–8.1)

## 2019-03-05 LAB — APTT: aPTT: 27 seconds (ref 24–36)

## 2019-03-05 LAB — CBC WITH DIFFERENTIAL/PLATELET
Abs Immature Granulocytes: 0.02 10*3/uL (ref 0.00–0.07)
Basophils Absolute: 0 10*3/uL (ref 0.0–0.1)
Basophils Relative: 0 %
Eosinophils Absolute: 0.1 10*3/uL (ref 0.0–0.5)
Eosinophils Relative: 1 %
HCT: 45 % (ref 36.0–46.0)
Hemoglobin: 14.7 g/dL (ref 12.0–15.0)
Immature Granulocytes: 0 %
Lymphocytes Relative: 35 %
Lymphs Abs: 2.5 10*3/uL (ref 0.7–4.0)
MCH: 29.9 pg (ref 26.0–34.0)
MCHC: 32.7 g/dL (ref 30.0–36.0)
MCV: 91.6 fL (ref 80.0–100.0)
Monocytes Absolute: 0.6 10*3/uL (ref 0.1–1.0)
Monocytes Relative: 8 %
Neutro Abs: 4 10*3/uL (ref 1.7–7.7)
Neutrophils Relative %: 56 %
Platelets: 385 10*3/uL (ref 150–400)
RBC: 4.91 MIL/uL (ref 3.87–5.11)
RDW: 13.4 % (ref 11.5–15.5)
WBC: 7.2 10*3/uL (ref 4.0–10.5)
nRBC: 0 % (ref 0.0–0.2)

## 2019-03-05 LAB — I-STAT CHEM 8, ED
BUN: 11 mg/dL (ref 8–23)
Calcium, Ion: 1.04 mmol/L — ABNORMAL LOW (ref 1.15–1.40)
Chloride: 105 mmol/L (ref 98–111)
Creatinine, Ser: 0.6 mg/dL (ref 0.44–1.00)
Glucose, Bld: 105 mg/dL — ABNORMAL HIGH (ref 70–99)
HCT: 48 % — ABNORMAL HIGH (ref 36.0–46.0)
Hemoglobin: 16.3 g/dL — ABNORMAL HIGH (ref 12.0–15.0)
Potassium: 3.8 mmol/L (ref 3.5–5.1)
Sodium: 139 mmol/L (ref 135–145)
TCO2: 27 mmol/L (ref 22–32)

## 2019-03-05 LAB — PROTIME-INR
INR: 1 (ref 0.8–1.2)
Prothrombin Time: 13.1 seconds (ref 11.4–15.2)

## 2019-03-05 LAB — ECHOCARDIOGRAM COMPLETE
Height: 60 in
Weight: 2032 oz

## 2019-03-05 LAB — RESPIRATORY PANEL BY RT PCR (FLU A&B, COVID)
Influenza A by PCR: NEGATIVE
Influenza B by PCR: NEGATIVE
SARS Coronavirus 2 by RT PCR: NEGATIVE

## 2019-03-05 LAB — CBG MONITORING, ED: Glucose-Capillary: 99 mg/dL (ref 70–99)

## 2019-03-05 LAB — HIV ANTIBODY (ROUTINE TESTING W REFLEX): HIV Screen 4th Generation wRfx: NONREACTIVE

## 2019-03-05 MED ORDER — IOHEXOL 350 MG/ML SOLN
75.0000 mL | Freq: Once | INTRAVENOUS | Status: AC | PRN
  Administered 2019-03-05: 75 mL via INTRAVENOUS

## 2019-03-05 MED ORDER — CLEVIDIPINE BUTYRATE 0.5 MG/ML IV EMUL
INTRAVENOUS | Status: AC
  Administered 2019-03-05: 24 mg via INTRAVENOUS
  Filled 2019-03-05: qty 50

## 2019-03-05 MED ORDER — ACETAMINOPHEN 160 MG/5ML PO SOLN: 650.0000 mg | ORAL | Status: DC | PRN

## 2019-03-05 MED ORDER — STROKE: EARLY STAGES OF RECOVERY BOOK: Freq: Once | Status: DC

## 2019-03-05 MED ORDER — ACETAMINOPHEN 325 MG PO TABS: 650.0000 mg | ORAL_TABLET | ORAL | Status: DC | PRN

## 2019-03-05 MED ORDER — CLEVIDIPINE BUTYRATE 0.5 MG/ML IV EMUL
0.0000 mg/h | INTRAVENOUS | Status: DC
  Filled 2019-03-05 (×6): qty 50

## 2019-03-05 MED ORDER — CLEVIDIPINE BUTYRATE 0.5 MG/ML IV EMUL
INTRAVENOUS | Status: AC | PRN
  Administered 2019-03-05: 1 mg/h via INTRAVENOUS

## 2019-03-05 MED ORDER — LABETALOL HCL 5 MG/ML IV SOLN: 20.0000 mg | Freq: Once | INTRAVENOUS | Status: DC

## 2019-03-05 MED ORDER — ALTEPLASE (STROKE) FULL DOSE INFUSION
0.9000 mg/kg | Freq: Once | INTRAVENOUS | Status: AC
  Administered 2019-03-05: 51.8 mg via INTRAVENOUS
  Filled 2019-03-05: qty 100

## 2019-03-05 MED ORDER — PANTOPRAZOLE SODIUM 40 MG IV SOLR
40.0000 mg | Freq: Every day | INTRAVENOUS | Status: DC
  Administered 2019-03-05 – 2019-03-06 (×2): 40 mg via INTRAVENOUS
  Filled 2019-03-05 (×2): qty 40

## 2019-03-05 MED ORDER — TIZANIDINE HCL 4 MG PO TABS
2.0000 mg | ORAL_TABLET | Freq: Once | ORAL | Status: AC
  Administered 2019-03-05: 2 mg via ORAL
  Filled 2019-03-05: qty 1

## 2019-03-05 MED ORDER — SODIUM CHLORIDE 0.9% FLUSH: 3.0000 mL | Freq: Once | INTRAVENOUS | Status: DC

## 2019-03-05 MED ORDER — LABETALOL HCL 5 MG/ML IV SOLN
INTRAVENOUS | Status: AC
  Filled 2019-03-05: qty 4

## 2019-03-05 MED ORDER — ACETAMINOPHEN 650 MG RE SUPP: 650.0000 mg | RECTAL | Status: DC | PRN

## 2019-03-05 MED ORDER — CHLORHEXIDINE GLUCONATE CLOTH 2 % EX PADS
6.0000 | MEDICATED_PAD | Freq: Every day | CUTANEOUS | Status: DC
  Administered 2019-03-05 – 2019-03-07 (×2): 6 via TOPICAL

## 2019-03-05 MED ORDER — SODIUM CHLORIDE 0.9 % IV SOLN: INTRAVENOUS | Status: DC

## 2019-03-05 MED ORDER — SENNOSIDES-DOCUSATE SODIUM 8.6-50 MG PO TABS: 1.0000 | ORAL_TABLET | Freq: Every evening | ORAL | Status: DC | PRN

## 2019-03-05 MED ORDER — LABETALOL HCL 5 MG/ML IV SOLN
INTRAVENOUS | Status: AC | PRN
  Administered 2019-03-05 (×4): 10 mg via INTRAVENOUS

## 2019-03-05 MED ORDER — SODIUM CHLORIDE 0.9 % IV SOLN
50.0000 mL | Freq: Once | INTRAVENOUS | Status: AC
  Administered 2019-03-05: 50 mL via INTRAVENOUS

## 2019-03-05 MED ORDER — LABETALOL HCL 5 MG/ML IV SOLN: 10.0000 mg | INTRAVENOUS | Status: DC | PRN

## 2019-03-05 NOTE — ED Provider Notes (Signed)
[facility] EMERGENCY DEPARTMENT Provider Note   CSN: [id number] Arrival date & time: 03/05/19  2025     History No chief complaint on file.   Renee Rodriguez is a 69 y.o. female.  HPI    Patient presents with right-sided weakness. Onset was less than 2 hours prior to ED arrival.  Patient has a history of hypertension, notes inconsistent compliance with her medication. Since onset symptoms have been waxing, waning, with only right-sided deficits. With these concerns, the patient's daughter called EMS. Initial evaluation conducted with our neurology colleagues, patient had emergent head CT, labs, and by the time of my evaluation has had worsening right-sided pronator drift, and she describes ongoing weakness on the right side, not improved in spite of initial Cleviprex, labetalol.  No pain, no speech changes   Past Medical History:  Diagnosis Date  . Anemia   . Arthritis   . GERD (gastroesophageal reflux disease)   . Hypertension     Patient Active Problem List   Diagnosis Date Noted  . Stroke (HCC) 03/05/2019  . Knee pain, left 10/19/2011  . HTN (hypertension) 10/19/2011    Past Surgical History:  Procedure Laterality Date  . CESAREAN SECTION  08/1982  . 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 TUBAL LIGATION  08/1982,01/1985     OB History   No obstetric history on file.     Family History  Problem Relation Age of Onset  . Stroke Mother        Cerebral hemorrhage  . Asthma Mother   . Alcohol abuse Mother   . Kidney disease Mother   . Hypertension Mother   . Cirrhosis Mother   . Hypertension Sister   . Colon cancer Neg Hx     Social History   Tobacco Use  . Smoking status: Current Some Day Smoker    Types: Cigarettes    Last attempt to quit: 02/29/1996    Years since quitting: 23.0  . Smokeless tobacco: Never Used  Substance Use Topics  . Alcohol use: Yes    Alcohol/week: 3.0 standard drinks    Types: 3 Standard drinks or equivalent per week    Comment: occasionally beer  . Drug use: No    Home Medications Prior to Admission medications   Medication Sig Start Date End Date Taking? Authorizing Provider  atorvastatin (LIPITOR) 20 MG tablet Take 1 tablet (20 mg total) by mouth daily. 09/26/17   Wallis Bamberg, PA-C  cyclobenzaprine (FLEXERIL) 5 MG tablet Take 1 tablet (5 mg total) by mouth 2 (two) times daily as needed for muscle spasms. 08/08/17   Wallis Bamberg, PA-C  lisinopril-hydrochlorothiazide (PRINZIDE,ZESTORETIC) 20-25 MG tablet Take 1 tablet by mouth daily. 08/08/17   Wallis Bamberg, PA-C    Allergies    Shellfish allergy  Review of Systems   Review of Systems  Constitutional:       Per HPI, otherwise negative  HENT:       Per HPI, otherwise negative  Respiratory:       Per HPI, otherwise negative  Cardiovascular:       Per HPI, otherwise negative  Gastrointestinal: Negative for vomiting.  Endocrine:       Negative aside from HPI  Genitourinary:       Neg aside from HPI   Musculoskeletal:  Per HPI, otherwise negative  Skin: Negative.   Neurological: Positive for weakness. Negative for syncope.    Physical Exam Updated Vital Signs Wt 57.6 kg   BMI 24.00 kg/m   Physical Exam Vitals and nursing note reviewed.  Constitutional:      General: She is not in acute distress.    Appearance: She is well-developed.  HENT:     Head: Normocephalic and atraumatic.  Eyes:     Conjunctiva/sclera: Conjunctivae normal.  Cardiovascular:     Rate and Rhythm: Normal rate and regular rhythm.  Pulmonary:     Effort: Pulmonary effort is normal. No respiratory distress.     Breath sounds: Normal breath sounds. No stridor.  Abdominal:     General: There is no distension.  Skin:    General: Skin is warm and dry.  Neurological:       Mental Status: She is alert and oriented to person, place, and time.     Cranial Nerves: No cranial nerve deficit.     Motor: Abnormal muscle tone and pronator drift present.     Coordination: Coordination abnormal.     ED Results / Procedures / Treatments   Labs (all labs ordered are listed, but only abnormal results are displayed) Labs Reviewed  I-STAT CHEM 8, ED - Abnormal; Notable for the following components:      Result Value   Glucose, Bld 105 (*)    Calcium, Ion 1.04 (*)    Hemoglobin 16.3 (*)    HCT 48.0 (*)    All other components within normal limits  PROTIME-INR  APTT  COMPREHENSIVE METABOLIC PANEL  CBC WITH DIFFERENTIAL/PLATELET  HIV ANTIBODY (ROUTINE TESTING W REFLEX)  CBG MONITORING, ED    EKG None  Radiology CT HEAD CODE STROKE WO CONTRAST  Result Date: 03/05/2019 CLINICAL DATA:  Code stroke.  Right-sided deficit EXAM: CT HEAD WITHOUT CONTRAST TECHNIQUE: Contiguous axial images were obtained from the base of the skull through the vertex without intravenous contrast. COMPARISON:  None. FINDINGS: Brain: Ventricle size normal. Hypodensity right thalamus compatible with infarct, probably chronic. Small white matter hypodensities compatible with ischemia, likely chronic. Small chronic infarct left caudate. Negative for acute cortical infarct. Negative for hemorrhage or mass. Vascular: Negative for hyperdense vessel Skull: Negative Sinuses/Orbits: Mild mucosal edema paranasal sinuses. Negative orbit. Other: None ASPECTS (Alberta Stroke Program Early CT Score) - Ganglionic level infarction (caudate, lentiform nuclei, internal capsule, insula, M1-M3 cortex): 7 - Supraganglionic infarction (M4-M6 cortex): 3 Total score (0-10 with 10 being normal): 10 IMPRESSION: 1. No acute intracranial abnormality. 2. ASPECTS is 10 3. Mild white matter ischemia. Right thalamic infarct appears chronic. No prior studies for comparison. 4. These results were called by telephone at the time of  interpretation on 03/05/2019 at 8:30 am to provider Aroor , who verbally acknowledged these results. Electronically Signed   By: Marlan Palau M.D.   On: 03/05/2019 08:31    Procedures Procedures (including critical care time)  CRITICAL CARE Performed by: Gerhard Munch Total critical care time: 35 minutes Critical care time was exclusive of separately billable procedures and treating other patients. Critical care was necessary to treat or prevent imminent or life-threatening deterioration. Critical care was time spent personally by me on the following activities: development of treatment plan with patient and/or surrogate as well as nursing, discussions with consultants, evaluation of patient's response to treatment, examination of patient, obtaining history from patient or surrogate, ordering and performing treatments and interventions, ordering and review of laboratory studies, ordering  and review of radiographic studies, pulse oximetry and re-evaluation of patient's condition.   Medications Ordered in ED Medications  sodium chloride flush (NS) 0.9 % injection 3 mL (has no administration in time range)  clevidipine (CLEVIPREX) 0.5 MG/ML infusion (has no administration in time range)  alteplase (ACTIVASE) 1 mg/mL infusion 51.8 mg (has no administration in time range)    Followed by  0.9 %  sodium chloride infusion (has no administration in time range)   stroke: mapping our early stages of recovery book (has no administration in time range)  0.9 %  sodium chloride infusion (has no administration in time range)  acetaminophen (TYLENOL) tablet 650 mg (has no administration in time range)    Or  acetaminophen (TYLENOL) 160 MG/5ML solution 650 mg (has no administration in time range)    Or  acetaminophen (TYLENOL) suppository 650 mg (has no administration in time range)  senna-docusate (Senokot-S) tablet 1 tablet (has no administration in time range)  pantoprazole (PROTONIX) injection 40 mg  (has no administration in time range)  labetalol (NORMODYNE) injection 20 mg (has no administration in time range)    And  clevidipine (CLEVIPREX) infusion 0.5 mg/mL (has no administration in time range)  labetalol (NORMODYNE) 5 MG/ML injection (has no administration in time range)  iohexol (OMNIPAQUE) 350 MG/ML injection 75 mL (75 mLs Intravenous Contrast Given 03/05/19 0841)    ED Course  I have reviewed the triage vital signs and the nursing notes.  Pertinent labs & imaging results that were available during my care of the patient were reviewed by me and considered in my medical decision making (see chart for details).    MDM Rules/Calculators/A&P                      Emergent CT brain reviewed while the patient was in the scanner, no bleed/ no mass.  Patient has received 2 doses of labetalol IV push, is on a Cleviprex drip.  She has had worsening dysarthria and weakness, though the changes are inconsistent.  Patient's daughter is aware of her condition, and both myself and the neurologist and spoke with her over the phone.  Update: tPa running.  2:35 PM Patient notes that she is feeling better, now has improved capacity to move her right leg, right arm. Blood pressure is improved, last 165/95.  She does now have some cramping sensation in both legs, but otherwise no new pain. This elderly female with inconsistent medication compliance presents with new right-sided weakness, waxing, waning severity of her neuro deficits. Patient does need it is a code stroke, had expeditious evaluation including CT, CTA, labs. Patient found to have elevated blood pressure requiring continuous medication, and with concern for stroke she received TPA as well. Patient blood pressure improved, neuro symptoms improved, but she requires admission to the neuro ICU for further monitoring, management. Final Clinical Impression(s) / ED Diagnoses Final diagnoses:  Cerebrovascular accident (CVA), unspecified  mechanism ([place])  Hypertensive crisis     Carmin Muskrat, MD 03/05/19 1437

## 2019-03-05 NOTE — ED Notes (Signed)
This RN attempted to call report and was asked to call floor RN back in . Will attempt report at [phone].

## 2019-03-05 NOTE — H&P (Addendum)
Neurology history and physical    CC: Right-sided decreased strength, decreased sensation, difficulty with speech  History is obtained from: EMS/patient  HPI: Renee Rodriguez is a 69 y.o. female with history of hypertension, GERD, arthritis and anemia.  Patient presented to the hospital as a code stroke.  Per EMS, patient woke up at 4 AM and she was normal.  At 630 she noted that she could not feel her right side, was having difficulty getting her words out, and also noticed that she was weak on her right arm and leg.  She admits to being off her blood pressure medications since last February.    Initially patient's speech showed no abnormalities however, while in the CT room patient was noted to have increased slurred speech in addition increased right-sided weakness.  Secondary to her symptoms, she was offered TPA with both risks and benefits discussed with her.  Patient agreed to receiving TPA.  There was a slight delay in giving patient TPA as she did have elevated blood pressure which needed to be decreased to systolic of 180 and diastolic of 105 prior to administering TPA.  That point 1 dose of labetalol was administered followed by Cleviprex.  ED course  -CT head to evaluate for hemorrhage or evidence stroke -CTA head neck to evaluate for large vessel occlusion -Labetalol and Cleviprex to lower patient's blood pressure -Administration of TPA for acute stroke -Labs: CBC, CMP to look for possible leukocytosis and or electrolyte disorder -INR prior to giving TPA  Chart review-no previous neurological notes in system  LKW: 6 AM on 03/05/2019 tpa given?:  Yes Premorbid modified Rankin scale (mRS): 0 NIH stroke score: 6   Past Medical History:  Diagnosis Date  . Anemia   . Arthritis   . GERD (gastroesophageal reflux disease)   . Hypertension      Family History  Problem Relation Age of Onset  . Stroke Mother        Cerebral hemorrhage  . Asthma Mother   . Alcohol abuse Mother    . Kidney disease Mother   . Hypertension Mother   . Cirrhosis Mother   . Hypertension Sister   . Colon cancer Neg Hx    Social History:   reports that she has been smoking cigarettes. She has never used smokeless tobacco. She reports current alcohol use of about 3.0 standard drinks of alcohol per week. She reports that she does not use drugs.  Medications  Current Facility-Administered Medications:  .   stroke: mapping our early stages of recovery book, , Does not apply, Once, Ulice Dash, PA-C .  alteplase (ACTIVASE) 1 mg/mL infusion 51.8 mg, 0.9 mg/kg, Intravenous, Once **FOLLOWED BY** 0.9 %  sodium chloride infusion, 50 mL, Intravenous, Once, Gerhard Munch, MD .  0.9 %  sodium chloride infusion, , Intravenous, Continuous, Ulice Dash, PA-C .  acetaminophen (TYLENOL) tablet 650 mg, 650 mg, Oral, Q4H PRN **OR** acetaminophen (TYLENOL) 160 MG/5ML solution 650 mg, 650 mg, Per Tube, Q4H PRN **OR** acetaminophen (TYLENOL) suppository 650 mg, 650 mg, Rectal, Q4H PRN, Ulice Dash, PA-C .  clevidipine (CLEVIPREX) 0.5 MG/ML infusion, , , ,  .  labetalol (NORMODYNE) injection 20 mg, 20 mg, Intravenous, Once **AND** clevidipine (CLEVIPREX) infusion 0.5 mg/mL, 0-21 mg/hr, Intravenous, Continuous, Ulice Dash, PA-C .  pantoprazole (PROTONIX) injection 40 mg, 40 mg, Intravenous, QHS, Ulice Dash, PA-C .  senna-docusate (Senokot-S) tablet 1 tablet, 1 tablet, Oral, QHS PRN, Ulice Dash, PA-C .  sodium chloride flush (NS) 0.9 % injection 3 mL, 3 mL, Intravenous, Once, Gerhard Munch, MD  Current Outpatient Medications:  .  atorvastatin (LIPITOR) 20 MG tablet, Take 1 tablet (20 mg total) by mouth daily., Disp: 90 tablet, Rfl: 3 .  cyclobenzaprine (FLEXERIL) 5 MG tablet, Take 1 tablet (5 mg total) by mouth 2 (two) times daily as needed for muscle spasms., Disp: 60 tablet, Rfl: 0 .  lisinopril-hydrochlorothiazide (PRINZIDE,ZESTORETIC) 20-25 MG tablet, Take 1 tablet by mouth daily.,  Disp: 90 tablet, Rfl: 1  ROS:  Unable to obtain due to altered mental status.   General ROS: negative for - chills, fatigue, fever, night sweats, weight gain or weight loss Psychological ROS: negative for - behavioral disorder, hallucinations, memory difficulties, mood swings or suicidal ideation Ophthalmic ROS: negative for - blurry vision, double vision, eye pain or loss of vision ENT ROS: negative for - epistaxis, nasal discharge, oral lesions, sore throat, tinnitus or vertigo Respiratory ROS: negative for - cough, hemoptysis, shortness of breath or wheezing Cardiovascular ROS: negative for - chest pain, dyspnea on exertion, edema or irregular heartbeat Gastrointestinal ROS: negative for - abdominal pain, diarrhea, hematemesis, nausea/vomiting or stool incontinence Genito-Urinary ROS: negative for - dysuria, hematuria, incontinence or urinary frequency/urgency Musculoskeletal ROS: Positive for -  muscular weakness Neurological ROS: as noted in HPI Dermatological ROS: negative for rash and skin lesion changes  Exam: Current vital signs: Wt 57.6 kg   BMI 24.00 kg/m  Vital signs in last 24 hours: Weight:  [57.6 kg] 57.6 kg (01/05 0800)   Constitutional: Appears well-developed and well-nourished.  Psych: Anxious Eyes: No scleral injection HENT: No OP obstrucion Head: Normocephalic.  Cardiovascular: Normal rate and regular rhythm.  Respiratory: Effort normal, non-labored breathing GI: Soft.  No distension. There is no tenderness.  Skin: WDI  Neuro: Mental Status: Patient is awake, alert, oriented to person, place, month, year, and situation. Speech- naming intact, repeating intact, comprehension intact patient does not show any aphasia.  Initially did not show any dysarthria however later in the course of given TPA she did show increased dysarthria Patient is able to give a clear and coherent history. Cranial Nerves: II: Visual Fields are full.  III,IV, VI: EOMI without ptosis  or diploplia. Pupils equal, round and reactive to light V: Facial sensation is creased on the right to light touch VII: Slight right facial droop VIII: hearing is intact to voice X: Palat elevates symmetrically XI: Shoulder shrug is symmetric. XII: tongue is midline without atrophy or fasciculations.  Motor: Tone is normal.  Right arm and leg initially 4+/5 with drift, however during administration of TPA and lowering blood pressure she did show decreased strength in the right arm and leg Sensory: Sensation decreased sensation on the right arm and leg  Deep Tendon Reflexes: 2+ and symmetric in the biceps and patellae.  Plantars: Toes are downgoing bilaterally.  Cerebellar: FNF and HKS are intact bilaterally  Labs I have reviewed labs in epic and the results pertinent to this consultation are:   CBC    Component Value Date/Time   WBC 11.6 (H) 07/20/2017 1524   RBC 5.08 07/20/2017 1524   HGB 16.3 (H) 03/05/2019 0824   HCT 48.0 (H) 03/05/2019 0824   PLT  07/20/2017 1524    PLATELET CLUMPS NOTED ON SMEAR, COUNT APPEARS ADEQUATE   MCV 87.8 07/20/2017 1524   MCH 29.1 07/20/2017 1524   MCHC 33.2 07/20/2017 1524   RDW 13.5 07/20/2017 1524   LYMPHSABS 2.6 07/20/2017 1524  MONOABS 0.6 07/20/2017 1524   EOSABS 0.1 07/20/2017 1524   BASOSABS 0.0 07/20/2017 1524    CMP     Component Value Date/Time   NA 139 03/05/2019 0824   NA 140 09/25/2017 1649   K 3.8 03/05/2019 0824   CL 105 03/05/2019 0824   CO2 23 09/25/2017 1649   GLUCOSE 105 (H) 03/05/2019 0824   BUN 11 03/05/2019 0824   BUN 13 09/25/2017 1649   CREATININE 0.60 03/05/2019 0824   CREATININE 0.49 (L) 12/31/2014 1504   CALCIUM 9.3 09/25/2017 1649   PROT 7.4 09/25/2017 1649   ALBUMIN 4.4 09/25/2017 1649   AST 19 09/25/2017 1649   ALT 16 09/25/2017 1649   ALKPHOS 107 09/25/2017 1649   BILITOT 0.7 09/25/2017 1649   GFRNONAA 93 09/25/2017 1649   GFRAA 107 09/25/2017 1649    Lipid Panel     Component Value  Date/Time   CHOL 269 (H) 09/25/2017 1649   TRIG 173 (H) 09/25/2017 1649   HDL 69 09/25/2017 1649   CHOLHDL 3.9 09/25/2017 1649   CHOLHDL 4.4 12/31/2014 1504   VLDL 22 12/31/2014 1504   LDLCALC 165 (H) 09/25/2017 1649   LDLDIRECT 173 (H) 10/19/2011 1141     Imaging I have reviewed the images obtained:  CT-scan of the brain-no acute intracranial abnormality.  Mild white matter ischemia.  Right thalamic infarct appears chronic.  No prior studies for comparison unfortunately.  CTA head neck: Occlusion of the right vertebral artery at its origin without significant reconstruction in the neck or V3 segment.  A hypoplastic distal right V4 segment is opacified, likely filling retrograde.  Mild atherosclerotic changes at the carotid bifurcation bilateral without significant stenosis.  Mild distal small vessel disease without any significant proximal stenosis, aneurysm, branch vessel occlusion within the circle of Willis.  Degenerative changes within the cervical spine  Etta Quill PA-C [facility] [phone]  M-F  (9:00 am- 5:00 PM)  03/05/2019, 8:50 AM     Assessment:  69 year old female with history of hypertension however has been noncompliant with her medications.  Patient was brought in as a code stroke for right-sided weakness, decreased sensation and dysarthria.  Exam was positive for the above symptoms.  While in CT patient it was discussed with patient both risks and benefits of TPA.  Patient agreed to receive TPA.  There was a slight delay in TPA administration secondary to elevated blood pressure.  At this time most likely diagnosis is stuttering small vessel stroke.   Impression: -Hypertension -Stroke  Plan:  Acute Ischemic Stroke  Acuity: Acute Current Suspected Etiology: Small vessel disease -Admit to: ICU -Continue Statin -Hold Aspirin until 24 hour post tPA neuroimaging is stable and without evidence of bleeding -Blood pressure control, goal of SYS  <180 -MRI/ECHO/A1C/Lipid panel. -Hyperglycemia management per SSI to maintain glucose 140-180mg /dL. -PT/OT/ST therapies and recommendations when able  CNS -Close neuro monitoring  Dysarthria following cerebral infarction  -NPO until cleared by speech -ST G  Hemiparesis following cerebral infarction affecting right dominant side -PT/OT -PM&R consult   RESP No active issues  CV  Essential (primary) hypertension Hypertensive Urgency -Aggressive BP control with labetalol and Cleviprex goal SBP <180 -Titrate Cleviprex for goal blood pressure   Hyperlipidemia, unspecified  - Statin for goal LDL < 70    HEME Currently no active issues -Monitor -transfuse for hgb < 7  ENDO No current active issues -goal HgbA1c < 7  Prophylaxis DVT: SCD GI: Protonix Bowel: Senokot  Code Status: Full Code  NEUROHOSPITALIST ADDENDUM Performed a face to face diagnostic evaluation at the time of code stroke.  I have reviewed the contents of history and physical exam as documented by PA/ARNP/Resident and agree with above documentation.  I have discussed and formulated the above plan as documented. Edits to the note have been made as needed.  69 year old female with past medical history of hypertension who ran out of blood pressure medications presented to the emergency room after sudden onset right-sided weakness, loss of sensation, slurred speech she notices 6:30 AM.  Patient was awake at 4 AM and was fine.  Her daughter called EMS, according to EMS her symptoms had improved but in route she became flaccid on the right side and then it improved again.  On arrival her initial NIH stroke scale was 4 however was maximally and at times would have significant weakness in the right arm and right leg.  She also scored for sensory, dysarthria and facial droop.  No visual field deficits.  Stat CT head was unremarkable for acute findings, CTA shows no large vessel occlusion. 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  No obvious intracranial stenosis.  We have offered TPA due to disabling nature of her stroke deficits, after discussion of risk versus benefit including 3-6% risk of major intracranial hemorrhage, patient agreed to receiving IV TPA.  There is a initial slight delay and then later TPA was also held due to very difficult to control blood pressure.  Patient was maxed out on Cleviprex and required multiple doses of labetalol for adequate blood pressure control.  Patient also complained mild headache on arrival, did not increase after receiving IV TPA.  Plan As stated above Patient will been admitted to neuro ICU for close monitoring No antiplatelets until 24 hours post TPA MRI brain, echocardiogram, lipid profile, A1c Will need to address stroke risk factors   This patient is neurologically critically ill due to acute ischemic stroke status post IV TPA.  She is at risk for significant risk of neurological worsening from cerebral edema,  death from brain herniation, heart failure, hemorrhagic conversion, infection, respiratory failure and seizure. This patient's care requires constant monitoring of vital signs, hemodynamics, respiratory and cardiac monitoring, review of multiple databases, neurological assessment, discussion with family, other specialists and medical decision making of high complexity.  I spent 45 minutes of neurocritical time in the care of this patient.      Georgiana Spinner Karlin Binion MD [facility] [phone]   If 7pm to 7am, please call on call as listed on AMION.

## 2019-03-05 NOTE — Code Documentation (Signed)
Stroke Response Nurse Documentation Code Documentation  Renee Rodriguez is a 69 y.o. female arriving to [facility] ED via [facility] on 03/05/2019 with past medical hx of HTN, GERD, arthritis, and anemia. Code stroke was activated by EMS prior to arrival. Patient from home where she was LKW at 0630 and now complaining of intermittent right sided weakness with right facial droop. EMS reported that patient went flaccid on her right arm and leg during transport and then improved. Hx of being On Lisinopril but she has not been taking it since last February. Stroke team at the bedside on patient arrival. Labs drawn and patient cleared for CT by Dr. Rhunette Croft. Patient to CT with team. NIHSS 5, see documentation for details and code stroke times. Patient with right facial droop, right arm weakness and bilateral leg weakness on exam. The following imaging was completed:  CT and CTA. Patient is a candidate for tPA. Two doses of 10 mg of Labetalol given to lower BP. BP 177/101 - tPA started at 0840 with 5.2 mg bolus given over one minute. BP elevated and tPA paused after bolus complete. Cleviprex started at 1 mg/hr and titrated per order to achieve BP goal. Two doses of 10 mg of Labetalol given. See MAR for details. tPA restarted at 0900. Pt remains on Cleviprex for BP management. NO LVO noted on CTA. Pt to be admitted to [address] [place]. Bedside handoff with ED RN Morrie Sheldon.   Lucila Maine  Stroke Response RN

## 2019-03-05 NOTE — Progress Notes (Signed)
PHARMACIST CODE STROKE RESPONSE  Notified to mix tPA at 0828 by Dr. Laurence Slate Delivered tPA to RN at [phone]  tPA dose = 5.2 mg bolus over 1 minute followed by 46.7 mg for a total dose of 51.9mg  over 1 hour  Issues/delays encountered (if applicable): None  Tera Mater 03/05/19 8:40 AM

## 2019-03-05 NOTE — ED Notes (Signed)
Swelling noted to right upper arm, no redness, tender to touch. IV to RAC with Cleviprex infusing. IV removed, Dr. Jeraldine Loots EDP made aware, at bedside to assess. Will continue to monitor.

## 2019-03-05 NOTE — Progress Notes (Signed)
  Echocardiogram 2D Echocardiogram has been performed.  Renee Rodriguez 03/05/2019, 5:50 PM

## 2019-03-05 NOTE — ED Notes (Signed)
Pt to ED via EMS from home, code stroke called. Last Known well  630- At 630 this morning pt had right side weakness, right side facial drop and c/o dysphagia, Per EMS. This has been intermittent in nature and dysphagia appears to be resolving. Pt not on blood thinners. A&0 X 4. Apparently off bp medications for extended amount of time.

## 2019-03-05 NOTE — ED Notes (Signed)
Pt eating dinner comfortably

## 2019-03-05 NOTE — ED Notes (Signed)
This RN attempted report a second time, was told Toni Amend RN would return call in .

## 2019-03-06 ENCOUNTER — Encounter (HOSPITAL_COMMUNITY): Payer: Self-pay | Admitting: Neurology

## 2019-03-06 ENCOUNTER — Other Ambulatory Visit: Payer: Self-pay

## 2019-03-06 ENCOUNTER — Inpatient Hospital Stay (HOSPITAL_COMMUNITY): Payer: Medicare Other

## 2019-03-06 DIAGNOSIS — I6300 Cerebral infarction due to thrombosis of unspecified precerebral artery: Secondary | ICD-10-CM

## 2019-03-06 LAB — HEMOGLOBIN A1C
Hgb A1c MFr Bld: 5.4 % (ref 4.8–5.6)
Mean Plasma Glucose: 108.28 mg/dL

## 2019-03-06 LAB — LIPID PANEL
Cholesterol: 288 mg/dL — ABNORMAL HIGH (ref 0–200)
HDL: 66 mg/dL (ref >40)
LDL Cholesterol: 200 mg/dL — ABNORMAL HIGH (ref 0–99)
Total CHOL/HDL Ratio: 4.4 RATIO
Triglycerides: 109 mg/dL (ref <150)
VLDL: 22 mg/dL (ref 0–40)

## 2019-03-06 LAB — RAPID URINE DRUG SCREEN, HOSP PERFORMED
Amphetamines: NOT DETECTED
Barbiturates: NOT DETECTED
Benzodiazepines: NOT DETECTED
Cocaine: NOT DETECTED
Opiates: NOT DETECTED
Tetrahydrocannabinol: POSITIVE — AB

## 2019-03-06 LAB — MRSA PCR SCREENING: MRSA by PCR: NEGATIVE

## 2019-03-06 MED ORDER — HYDROCHLOROTHIAZIDE 25 MG PO TABS
25.0000 mg | ORAL_TABLET | Freq: Every day | ORAL | Status: DC
  Administered 2019-03-06 – 2019-03-07 (×2): 25 mg via ORAL
  Filled 2019-03-06 (×2): qty 1

## 2019-03-06 MED ORDER — ASPIRIN EC 81 MG PO TBEC
81.0000 mg | DELAYED_RELEASE_TABLET | Freq: Every day | ORAL | Status: DC
  Administered 2019-03-06 – 2019-03-07 (×2): 81 mg via ORAL
  Filled 2019-03-06 (×2): qty 1

## 2019-03-06 MED ORDER — LISINOPRIL 10 MG PO TABS
10.0000 mg | ORAL_TABLET | Freq: Every day | ORAL | Status: DC
  Administered 2019-03-06 – 2019-03-07 (×2): 10 mg via ORAL
  Filled 2019-03-06 (×2): qty 1

## 2019-03-06 MED ORDER — CLOPIDOGREL BISULFATE 75 MG PO TABS
75.0000 mg | ORAL_TABLET | Freq: Every day | ORAL | Status: DC
  Administered 2019-03-06 – 2019-03-07 (×2): 75 mg via ORAL
  Filled 2019-03-06 (×2): qty 1

## 2019-03-06 MED ORDER — ATORVASTATIN CALCIUM 80 MG PO TABS
80.0000 mg | ORAL_TABLET | Freq: Every day | ORAL | Status: DC
  Administered 2019-03-06 – 2019-03-07 (×2): 80 mg via ORAL
  Filled 2019-03-06 (×2): qty 1

## 2019-03-06 MED ORDER — PRAVASTATIN SODIUM 10 MG PO TABS: 20.0000 mg | ORAL_TABLET | Freq: Every day | ORAL | Status: DC

## 2019-03-06 NOTE — Progress Notes (Signed)
Pt arrived to [facility] Room 19 at 2200. Pt is alert and oriented, pleasant, and following commands appropriately. Pt able to move herself from the ED bed to the ICU bed on the right side. She is still experiencing some right sided weakness, especially in her upper extremity, but she claims she "feels like I am getting much stronger". Vital signs stable, pt on room air, and not complaining of any pain. NIHSS score 1 on arrival to the unit due to a slight right sided facial droop. PERRLA  Intact. Will continue to monitor closely.

## 2019-03-06 NOTE — Evaluation (Signed)
Occupational Therapy Evaluation Patient Details Name: Renee Rodriguez MRN: [id number] DOB: November 06, 1950 Today's Date: 03/06/2019    History of Present Illness 69 y.o. female with history of hypertension, GERD, arthritis and anemia.  Patient presented to the hospital as a code stroke with R numbness and weakness as well as expressive aphasia.   Clinical Impression   This 69 y/o female presents with the above. PTA pt reports independence with ADL and functional mobility. Pt presents seated in recliner pleasant and willing to participate in therapy session. Session limited as pt with elevated BP in sitting (178/115). Pt presenting with dominant RUE weakness and impaired coordination impacting her ability to perform ADL tasks. Pt currently requiring setup-minA for self-feeding and seated UB ADL. Will continue to assess mobility status in following sessions. Initiated education of RUE HEP for strengthening/improved coordination. Pt will benefit from continued acute OT services and currently recommend follow up outpatient neuro OT services (pending progress) to further address RUE deficits and to maximize her safety and independence with ADL and mobility. Will follow.     Follow Up Recommendations  Outpatient OT;Supervision/Assistance - 24 hour(outpt neuro)    Equipment Recommendations  Tub/shower seat           Precautions / Restrictions Precautions Precautions: Fall Restrictions Weight Bearing Restrictions: No      Mobility Bed Mobility               General bed mobility comments: received OOB in recliner  Transfers Overall transfer level: Needs assistance               General transfer comment: pt repositioning herself in chair without difficulty, deferred further mobility due to elevated BP (178/115) in sitting     Balance Overall balance assessment: Needs assistance Sitting-balance support: No upper extremity supported;Feet supported Sitting balance-Leahy Scale:  Good                                     ADL either performed or assessed with clinical judgement   ADL Overall ADL's : Needs assistance/impaired Eating/Feeding: Set up;With adaptive utensils;Sitting Eating/Feeding Details (indicate cue type and reason): reports RN assisted to build up utensils for lunch as she was having difficulty holding utensil  Grooming: Wash/dry face;Set up;Sitting   Upper Body Bathing: Set up;Sitting       Upper Body Dressing : Set up;Sitting                     General ADL Comments: session limited to chair level as pt with elevated BP (178/115); pt requiring increased time and effort for ADL tasks at this time given RUE weakness and impaired coordination                          Pertinent Vitals/Pain Pain Assessment: No/denies pain     Hand Dominance Right   Extremity/Trunk Assessment Upper Extremity Assessment Upper Extremity Assessment: RUE deficits/detail RUE Deficits / Details: Grossly 4-/5, decreased coordination/fine motor noted RUE Sensation: WNL RUE Coordination: decreased fine motor   Lower Extremity Assessment Lower Extremity Assessment: Defer to PT evaluation       Communication Communication Communication: No difficulties   Cognition Arousal/Alertness: Awake/alert Behavior During Therapy: WFL for tasks assessed/performed Overall Cognitive Status: Within Functional Limits for tasks assessed  General Comments       Exercises Exercises: Other exercises;Hand exercises Hand Exercises Digit Composite Flexion: AROM;Right;5 reps Composite Extension: AROM;Right;5 reps Opposition: AROM;Right;5 reps Other Exercises Other Exercises: educated in fine motor activities for use with RUE; pt practicing opening/closing toothpaste cap using R hand given increased time, x5 reps   Shoulder Instructions      Home Living Family/patient expects to be discharged  to:: Private residence Living Arrangements: Children;Other relatives(grandchildren) Available Help at Discharge: Family;Available 24 hours/day Type of Home: House Home Access: Stairs to enter [employer] of Steps: 2 Entrance Stairs-Rails: None Home Layout: One level     Bathroom Shower/Tub: Teacher, early years/pre: Standard     Home Equipment: None          Prior Functioning/Environment Level of Independence: Independent                 OT Problem List: Decreased strength;Decreased range of motion;Decreased activity tolerance;Impaired balance (sitting and/or standing);Decreased coordination;Impaired UE functional use;Decreased knowledge of use of DME or AE      OT Treatment/Interventions: Self-care/ADL training;Therapeutic exercise;Energy conservation;DME and/or AE instruction;Therapeutic activities;Patient/family education;Balance training;Neuromuscular education    OT Goals(Current goals can be found in the care plan section) Acute Rehab OT Goals Patient Stated Goal: To return to independent mobility OT Goal Formulation: With patient Time For Goal Achievement: 03/20/19 Potential to Achieve Goals: Good  OT Frequency: Min 2X/week   Barriers to D/C:            Co-evaluation              AM-PAC OT "6 Clicks" Daily Activity     Outcome Measure Help from another person eating meals?: A Little Help from another person taking care of personal grooming?: A Little Help from another person toileting, which includes using toliet, bedpan, or urinal?: A Little Help from another person bathing (including washing, rinsing, drying)?: A Lot Help from another person to put on and taking off regular upper body clothing?: A Little Help from another person to put on and taking off regular lower body clothing?: A Lot 6 Click Score: 16   End of Session Nurse Communication: Mobility status  Activity Tolerance: Patient tolerated treatment well;Treatment  limited secondary to medical complications (Comment)(elevated BP, RN made aware) Patient left: in chair;with call bell/phone within reach  OT Visit Diagnosis: Muscle weakness (generalized) (M62.81);Other symptoms and signs involving the nervous system (R29.898);Hemiplegia and hemiparesis Hemiplegia - Right/Left: Right Hemiplegia - dominant/non-dominant: Dominant                Time: 2585-2778 OT Time Calculation (min): 25 min Charges:  OT General Charges $OT Visit: 1 Visit OT Evaluation $OT Eval Moderate Complexity: 1 Mod OT Treatments $Therapeutic Activity: 8-22 mins  Renee Rodriguez, OT [facility] Services Pager [phone] Office [phone]  Renee Rodriguez 03/06/2019, 3:27 PM

## 2019-03-06 NOTE — Progress Notes (Signed)
    [facility] has been requested to perform a transesophageal echocardiogram on Renee Rodriguez for stroke.  After careful review of history and examination, the risks and benefits of transesophageal echocardiogram have been explained including risks of esophageal damage, perforation (1:10,000 risk), bleeding, pharyngeal hematoma as well as other potential complications associated with conscious sedation including aspiration, arrhythmia, respiratory failure and death. Alternatives to treatment were discussed, questions were answered. Patient is willing to proceed.   Pt scheduled for TEE tomorrow at 2:00pm tomorrow. NPO at MN tonight.  [name], [place]  03/06/2019 5:17 PM

## 2019-03-06 NOTE — H&P (View-Only) (Signed)
STROKE TEAM PROGRESS NOTE   INTERVAL HISTORY I have reviewed history of presenting illness in detail with the patient, electronic medical records and imaging films in PACS.  She presented with sudden onset of left hemiplegia received IV TPA after telemetry consult and is made significant recovery.  Blood pressure adequately controlled.  She has no complaints today.  Follow-up post TPA imaging is pending  Vitals:   03/06/19 0600 03/06/19 0700 03/06/19 0802 03/06/19 1000  BP: (!) 153/96 (!) 148/96 (!) 170/107 (!) 164/93  Pulse: 84 87    Resp: 18 14 (!) 26 (!) 29  Temp:      TempSrc:      SpO2: 99% 100%    Weight:      Height:        CBC:  Recent Labs  Lab 03/05/19 0824 03/05/19 0832  WBC  --  7.2  NEUTROABS  --  4.0  HGB 16.3* 14.7  HCT 48.0* 45.0  MCV  --  91.6  PLT  --  385    Basic Metabolic Panel:  Recent Labs  Lab 03/05/19 0814 03/05/19 0824  NA 138 139  K 4.0 3.8  CL 103 105  CO2 20*  --   GLUCOSE 105* 105*  BUN 9 11  CREATININE 0.63 0.60  CALCIUM 9.3  --    Lipid Panel:     Component Value Date/Time   CHOL 288 (H) 03/06/2019 0635   CHOL 269 (H) 09/25/2017 1649   TRIG 109 03/06/2019 0635   HDL 66 03/06/2019 0635   HDL 69 09/25/2017 1649   CHOLHDL 4.4 03/06/2019 0635   VLDL 22 03/06/2019 0635   LDLCALC 200 (H) 03/06/2019 0635   LDLCALC 165 (H) 09/25/2017 1649   HgbA1c:  Lab Results  Component Value Date   HGBA1C 5.4 03/06/2019   Urine Drug Screen:     Component Value Date/Time   LABOPIA NONE DETECTED 03/06/2019 1023   COCAINSCRNUR NONE DETECTED 03/06/2019 1023   LABBENZ NONE DETECTED 03/06/2019 1023   AMPHETMU NONE DETECTED 03/06/2019 1023   THCU POSITIVE (A) 03/06/2019 1023   LABBARB NONE DETECTED 03/06/2019 1023    Alcohol Level     Component Value Date/Time   ETH <10 07/20/2017 1524    IMAGING past 48 hours CT ANGIO HEAD W OR WO CONTRAST  Result Date: 03/05/2019 CLINICAL DATA:  Right-sided deficit. EXAM: CT ANGIOGRAPHY HEAD AND  NECK TECHNIQUE: Multidetector CT imaging of the head and neck was performed using the standard protocol during bolus administration of intravenous contrast. Multiplanar CT image reconstructions and MIPs were obtained to evaluate the vascular anatomy. Carotid stenosis measurements (when applicable) are obtained utilizing NASCET criteria, using the distal internal carotid diameter as the denominator. CONTRAST:  65mL OMNIPAQUE IOHEXOL 350 MG/ML SOLN COMPARISON:  CT head without contrast 03/05/2019. FINDINGS: CTA NECK FINDINGS Aortic arch: A 3 vessel arch configuration is present. No significant atherosclerotic change, aneurysm, or stenosis is present. Right carotid system: The right common carotid artery is within normal limits. Noncalcified plaque is present at the carotid bifurcation and proximal right internal carotid artery. Minimal calcifications are present as well. There is no significant stenosis relative to the more distal vessel. The cervical right ICA is otherwise normal. There is slight vessel irregularity in the mid cervical right ICA without significant stenosis. Left carotid system: The left common carotid artery is within normal limits. Minimal atherosclerotic changes present at the proximal left ICA without significant stenosis. There is mild irregularity of the vessel in  the mid cervical segment without significant stenosis. Focal calcification is present in the wall the distal left ICA, just below the skull base without significant stenosis. Vertebral arteries: The left vertebral artery is the dominant vessel. It originates from the subclavian artery without significant stenosis. There is no significant stenosis in the left vertebral artery in the neck. The right vertebral artery is occluded. There is no significant reconstitution of the vessel in the neck. Skeleton: Mild endplate degenerative changes are present lower cervical spine. There straightening of the normal cervical lordosis. No focal  lytic or blastic lesions are present. Other neck: The soft tissues the neck are otherwise unremarkable. Salivary glands are within normal limits. No focal mucosal lesions are present. The thyroid is somewhat heterogeneous, but not enlarged. There is no dominant lesion. No followup recommended (ref: J Am Coll Radiol. 2015 Feb;12(2): 143-50). No significant adenopathy is present. Upper chest: The lung apices are clear. Thoracic inlet is within normal limits. Review of the MIP images confirms the above findings CTA HEAD FINDINGS Anterior circulation: Atherosclerotic calcifications are present within the cavernous internal carotid arteries bilaterally. There is no significant stenosis relative to the more distal vessels. ICA termini are within normal limits bilaterally. The A1 and M1 segments are within normal limits. The anterior communicating artery is patent. MCA bifurcations are intact. There is mild distal small vessel irregularity within the ACA and MCA divisions. No significant proximal stenosis or occlusion is present. There is no aneurysm. Posterior circulation: The hypoplastic distal right V4 segment is opacified, likely filling retrograde. Left vertebral artery is within normal limits. PICA origin is visualized and normal. The basilar artery is small. Both posterior cerebral arteries are of fetal type. Small P1 segments contribute, more prominent right than left. Distal segmental narrowing is present within PCA branch vessels without a significant proximal stenosis or occlusion. Venous sinuses: Dural sinuses are patent. The straight sinus deep cerebral veins are intact. Cortical veins are unremarkable. Anatomic variants: Fetal type posterior cerebral arteries bilaterally. Review of the MIP images confirms the above findings IMPRESSION: 1. Occlusion of the right vertebral artery at its origin without significant reconstitution in the neck or V3 segment. 2. A hypoplastic distal right V4 segment is opacified,  likely filling retrograde. 3. Mild atherosclerotic changes at the carotid bifurcations bilaterally without significant stenosis. 4. Mild distal small vessel disease without a significant proximal stenosis, aneurysm, or branch vessel occlusion within the Circle of Willis. 5. Degenerative changes within the cervical spine. Electronically Signed   By: Marin Roberts M.D.   On: 03/05/2019 08:55   CT HEAD WO CONTRAST  Result Date: 03/06/2019 CLINICAL DATA:  Stroke, follow-up post tPA EXAM: CT HEAD WITHOUT CONTRAST TECHNIQUE: Contiguous axial images were obtained from the base of the skull through the vertex without intravenous contrast. COMPARISON:  03/05/2019 FINDINGS: Brain: There is no acute intracranial hemorrhage. Ill-defined low attenuation is present along the acute infarction seen on prior MRI involving left basal ganglia and corona radiata. Chronic small vessel infarct of the right thalamus is again noted. Additional patchy hypoattenuation in the supratentorial white matter likely reflects stable mild chronic microvascular ischemic changes. There is no significant mass effect, hydrocephalus, or extra-axial collection. Vascular: There is atherosclerotic calcification at the skull base. Skull: Calvarium is unremarkable. Sinuses/Orbits: No acute finding. Other: None. IMPRESSION: No acute intracranial hemorrhage. Evolving acute infarction of the left basal ganglia and corona radiata. Stable additional chronic findings detailed above. Electronically Signed   By: Guadlupe Spanish M.D.   On: 03/06/2019  11:46   CT ANGIO NECK W OR WO CONTRAST  Result Date: 03/05/2019 CLINICAL DATA:  Right-sided deficit. EXAM: CT ANGIOGRAPHY HEAD AND NECK TECHNIQUE: Multidetector CT imaging of the head and neck was performed using the standard protocol during bolus administration of intravenous contrast. Multiplanar CT image reconstructions and MIPs were obtained to evaluate the vascular anatomy. Carotid stenosis measurements  (when applicable) are obtained utilizing NASCET criteria, using the distal internal carotid diameter as the denominator. CONTRAST:  65mL OMNIPAQUE IOHEXOL 350 MG/ML SOLN COMPARISON:  CT head without contrast 03/05/2019. FINDINGS: CTA NECK FINDINGS Aortic arch: A 3 vessel arch configuration is present. No significant atherosclerotic change, aneurysm, or stenosis is present. Right carotid system: The right common carotid artery is within normal limits. Noncalcified plaque is present at the carotid bifurcation and proximal right internal carotid artery. Minimal calcifications are present as well. There is no significant stenosis relative to the more distal vessel. The cervical right ICA is otherwise normal. There is slight vessel irregularity in the mid cervical right ICA without significant stenosis. Left carotid system: The left common carotid artery is within normal limits. Minimal atherosclerotic changes present at the proximal left ICA without significant stenosis. There is mild irregularity of the vessel in the mid cervical segment without significant stenosis. Focal calcification is present in the wall the distal left ICA, just below the skull base without significant stenosis. Vertebral arteries: The left vertebral artery is the dominant vessel. It originates from the subclavian artery without significant stenosis. There is no significant stenosis in the left vertebral artery in the neck. The right vertebral artery is occluded. There is no significant reconstitution of the vessel in the neck. Skeleton: Mild endplate degenerative changes are present lower cervical spine. There straightening of the normal cervical lordosis. No focal lytic or blastic lesions are present. Other neck: The soft tissues the neck are otherwise unremarkable. Salivary glands are within normal limits. No focal mucosal lesions are present. The thyroid is somewhat heterogeneous, but not enlarged. There is no dominant lesion. No followup  recommended (ref: J Am Coll Radiol. 2015 Feb;12(2): 143-50). No significant adenopathy is present. Upper chest: The lung apices are clear. Thoracic inlet is within normal limits. Review of the MIP images confirms the above findings CTA HEAD FINDINGS Anterior circulation: Atherosclerotic calcifications are present within the cavernous internal carotid arteries bilaterally. There is no significant stenosis relative to the more distal vessels. ICA termini are within normal limits bilaterally. The A1 and M1 segments are within normal limits. The anterior communicating artery is patent. MCA bifurcations are intact. There is mild distal small vessel irregularity within the ACA and MCA divisions. No significant proximal stenosis or occlusion is present. There is no aneurysm. Posterior circulation: The hypoplastic distal right V4 segment is opacified, likely filling retrograde. Left vertebral artery is within normal limits. PICA origin is visualized and normal. The basilar artery is small. Both posterior cerebral arteries are of fetal type. Small P1 segments contribute, more prominent right than left. Distal segmental narrowing is present within PCA branch vessels without a significant proximal stenosis or occlusion. Venous sinuses: Dural sinuses are patent. The straight sinus deep cerebral veins are intact. Cortical veins are unremarkable. Anatomic variants: Fetal type posterior cerebral arteries bilaterally. Review of the MIP images confirms the above findings IMPRESSION: 1. Occlusion of the right vertebral artery at its origin without significant reconstitution in the neck or V3 segment. 2. A hypoplastic distal right V4 segment is opacified, likely filling retrograde. 3. Mild atherosclerotic changes at  the carotid bifurcations bilaterally without significant stenosis. 4. Mild distal small vessel disease without a significant proximal stenosis, aneurysm, or branch vessel occlusion within the Circle of Willis. 5.  Degenerative changes within the cervical spine. Electronically Signed   By: Marin Robertshristopher  Mattern M.D.   On: 03/05/2019 08:55   MR BRAIN WO CONTRAST  Result Date: 03/06/2019 CLINICAL DATA:  69 year old female code stroke presentation yesterday with right side deficit. 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 EXAM: MRI HEAD WITHOUT CONTRAST TECHNIQUE: Multiplanar, multiecho pulse sequences of the brain and surrounding structures were obtained without intravenous contrast. COMPARISON:  CTA head and neck and head CT yesterday. FINDINGS: Brain: Linear 12 millimeter restricted diffusion in the posterior left lentiform (series 3, image 17). Separate but nearby small 6 millimeter focus of restricted diffusion in the left posterior periventricular white matter, corona radiata (series 2, image 26). Faint T2 and FLAIR hyperintensity. No associated hemorrhage or mass effect. No other restricted diffusion. Chronic lacunar infarcts in the thalami. Scattered bilateral cerebral white matter T2 and FLAIR hyperintensity which in some and other areas resembles small chronic white matter lacunae (series 7, image 16). Similar patchy T2 and FLAIR hyperintensity in the pons. No cortical encephalomalacia identified. Possible chronic microhemorrhage in the dorsal right thalamus on series 11, image 12. No other chronic cerebral blood products. No midline shift, mass effect, evidence of mass lesion, ventriculomegaly, extra-axial collection or acute intracranial hemorrhage. Cervicomedullary junction and pituitary are within normal limits. Vascular: Major intracranial vascular flow voids are preserved aside from the distal right vertebral artery, where a diminutive V4 segment is faintly apparent. Skull and upper cervical spine: Negative visible cervical spine. Normal bone marrow signal. Sinuses/Orbits: Negative orbits. Paranasal sinuses are well pneumatized. Other: Mastoids are clear. Grossly normal visible internal auditory  structures. Scalp and face soft tissues appear negative. IMPRESSION: 1. Small acute lacunar infarcts in the posterior left lentiform nucleus and the posterior left corona radiata. No associated hemorrhage or mass effect. 2. Underlying chronic small vessel disease, with a prominent chronic lacunar infarct and microhemorrhage in the right thalamus. 3. Evidence of reconstituted diminutive right vertebral V4 and/or PICA in the setting of the cervical Right Vertebral Artery occlusion demonstrated yesterday. Electronically Signed   By: Odessa FlemingH  Hall M.D.   On: 03/06/2019 09:23   ECHOCARDIOGRAM COMPLETE  Result Date: 03/05/2019   ECHOCARDIOGRAM REPORT   Patient Name:   Renee Rodriguez Date of Exam: 03/05/2019 Medical Rec #:  [id number]       Height:       60.0 in Accession #:    [id number]      Weight:       127.0 lb Date of Birth:  08/16/1950       BSA:          1.54 m Patient Age:    68 years        BP:           152/89 mmHg Patient Gender: F               HR:           78 bpm. Exam Location:  Inpatient Procedure: 2D Echo Indications:    Stroke 434.91/I163.9  History:        Patient has no prior history of Echocardiogram examinations.                 Risk Factors:Hypertension.  Sonographer:    Ross LudwigArthur Guy RDCS (AE) Referring Phys: [phone] DAVID R Katrinka BlazingSMITH  Sonographer Comments: Patient moving constantly throughout exam. IMPRESSIONS  1. Left ventricular ejection fraction, by visual estimation, is 60 to 65%. The left ventricle has normal function. Left ventricular septal wall thickness was mildly increased. There is mildly increased left ventricular hypertrophy.  2. Left ventricular diastolic parameters are consistent with Grade I diastolic dysfunction (impaired relaxation).  3. Global right ventricle has normal systolic function.The right ventricular size is normal. No increase in right ventricular wall thickness.  4. Left atrial size was mildly dilated.  5. Right atrial size was normal.  6. The mitral valve is normal in structure. No  evidence of mitral valve regurgitation. No evidence of mitral stenosis.  7. The tricuspid valve is normal in structure.  8. The aortic valve is tricuspid. Aortic valve regurgitation is not visualized. No evidence of aortic valve sclerosis or stenosis.  9. The pulmonic valve was normal in structure. Pulmonic valve regurgitation is not visualized. 10. The inferior vena cava is normal in size with greater than 50% respiratory variability, suggesting right atrial pressure of 3 mmHg. 11. No intracardiac source of emboli noted. FINDINGS  Left Ventricle: Left ventricular ejection fraction, by visual estimation, is 60 to 65%. The left ventricle has normal function. The left ventricle has no regional wall motion abnormalities. There is mildly increased left ventricular hypertrophy. Left ventricular diastolic parameters are consistent with Grade I diastolic dysfunction (impaired relaxation). Normal left atrial pressure. Right Ventricle: The right ventricular size is normal. No increase in right ventricular wall thickness. Global RV systolic function is has normal systolic function. Left Atrium: Left atrial size was mildly dilated. Right Atrium: Right atrial size was normal in size Pericardium: There is no evidence of pericardial effusion. Mitral Valve: The mitral valve is normal in structure. No evidence of mitral valve regurgitation. No evidence of mitral valve stenosis by observation. MV peak gradient, 3.9 mmHg. Tricuspid Valve: The tricuspid valve is normal in structure. Tricuspid valve regurgitation is trivial. Aortic Valve: The aortic valve is tricuspid. Aortic valve regurgitation is not visualized. The aortic valve is structurally normal, with no evidence of sclerosis or stenosis. Pulmonic Valve: The pulmonic valve was normal in structure. Pulmonic valve regurgitation is not visualized. Pulmonic regurgitation is not visualized. Aorta: The aortic root and ascending aorta are structurally normal, with no evidence of  dilitation. Venous: The inferior vena cava is normal in size with greater than 50% respiratory variability, suggesting right atrial pressure of 3 mmHg. IAS/Shunts: No atrial level shunt detected by color flow Doppler. There is no evidence of a patent foramen ovale. No ventricular septal defect is seen or detected. There is no evidence of an atrial septal defect.  LEFT VENTRICLE PLAX 2D LVIDd:         3.57 cm  Diastology LVIDs:         1.92 cm  LV e' lateral:   6.85 cm/s LV PW:         1.14 cm  LV E/e' lateral: 7.8 LV IVS:        1.30 cm  LV e' medial:    5.00 cm/s LVOT diam:     1.80 cm  LV E/e' medial:  10.7 LV SV:         42 ml LV SV Index:   26.60 LVOT Area:     2.54 cm  RIGHT VENTRICLE             IVC RV Basal diam:  3.44 cm     IVC diam: 1.59 cm RV S prime:  10.10 cm/s TAPSE (M-mode): 2.3 cm LEFT ATRIUM             Index       RIGHT ATRIUM           Index LA diam:        2.90 cm 1.88 cm/m  RA Area:     14.20 cm LA Vol (A2C):   53.7 ml 34.89 ml/m RA Volume:   36.70 ml  23.85 ml/m LA Vol (A4C):   48.9 ml 31.77 ml/m LA Biplane Vol: 52.6 ml 34.18 ml/m  AORTIC VALVE LVOT Vmax:   125.00 cm/s LVOT Vmean:  78.400 cm/s LVOT VTI:    0.225 m  AORTA Ao Root diam: 3.00 cm Ao Asc diam:  3.30 cm MITRAL VALVE MV Area (PHT): 2.48 cm             SHUNTS MV Peak grad:  3.9 mmHg             Systemic VTI:  0.22 m MV Mean grad:  1.0 mmHg             Systemic Diam: 1.80 cm MV Vmax:       0.99 m/s MV Vmean:      53.4 cm/s MV VTI:        0.21 m MV PHT:        88.74 msec MV Decel Time: 306 msec MV E velocity: 53.60 cm/s 103 cm/s MV A velocity: 96.80 cm/s 70.3 cm/s MV E/A ratio:  0.55       1.5  Weston Brass MD Electronically signed by Weston Brass MD Signature Date/Time: 03/05/2019/10:24:50 PM    Final    CT HEAD CODE STROKE WO CONTRAST  Result Date: 03/05/2019 CLINICAL DATA:  Code stroke.  Right-sided deficit EXAM: CT HEAD WITHOUT CONTRAST TECHNIQUE: Contiguous axial images were obtained from the base of the skull  through the vertex without intravenous contrast. COMPARISON:  None. FINDINGS: Brain: Ventricle size normal. Hypodensity right thalamus compatible with infarct, probably chronic. Small white matter hypodensities compatible with ischemia, likely chronic. Small chronic infarct left caudate. Negative for acute cortical infarct. Negative for hemorrhage or mass. Vascular: Negative for hyperdense vessel Skull: Negative Sinuses/Orbits: Mild mucosal edema paranasal sinuses. Negative orbit. Other: None ASPECTS ([place] Stroke Program Early CT Score) - Ganglionic level infarction (caudate, lentiform nuclei, internal capsule, insula, M1-M3 cortex): 7 - Supraganglionic infarction (M4-M6 cortex): 3 Total score (0-10 with 10 being normal): 10 IMPRESSION: 1. No acute intracranial abnormality. 2. ASPECTS is 10 3. Mild white matter ischemia. Right thalamic infarct appears chronic. No prior studies for comparison. 4. These results were called by telephone at the time of interpretation on 03/05/2019 at 8:30 am to provider Aroor , who verbally acknowledged these results. Electronically Signed   By: Marlan Palau M.D.   On: 03/05/2019 08:31    PHYSICAL EXAM Pleasant frail middle-aged African-American lady not in distress. . Afebrile. Head is nontraumatic. Neck is supple without bruit.    Cardiac exam no murmur or gallop. Lungs are clear to auscultation. Distal pulses are well felt. Neurological Exam ;  Awake  Alert oriented x 3. Normal speech and language.eye movements full without nystagmus.fundi were not visualized. Vision acuity and fields appear normal. Hearing is normal. Palatal movements are normal. Face symmetric. Tongue midline. Normal strength, tone, reflexes and coordination. Normal sensation. Gait deferred.  ASSESSMENT/PLAN Renee Rodriguez is a 69 y.o. female with history of hypertension, GERD, arthritis and anemia presenting with R sided weakness  and inability to get her words out. Received tPA 03/05/2019 at [facility].    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 Small vessel disease. Old R thalamic infarct. ASPECTS 10.     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 Mild ICA bifurcation atherosclerosis. Mild small vessel disease. Degenerative CS.  MRI  Small posterior lentiform nucleus and corona radiata infarct > 2cm in size. Small vessel disease. Old R thalamic lacune and micro hemorrhage. R VA cervical artery occlusion.   Repeat CT head 24h no hemorrhage. evolving L basal ganglia and corona radiata infarct   2D Echo EF 60-65%. No source of embolus. RA dilated.   TEE to look for embolic source. Requested with [place] for tomorrow.  If unable to do tomorrow, we will not do acutely.  If TEE done and positive for PFO (patent foramen ovale), check bilateral lower extremity venous dopplers to rule out DVT as possible source of stroke. (I have made patient NPO after midnight tonight).   If TEE negative, a [place] electrophysiologist will consult and consider placement of an implantable loop recorder to evaluate for atrial fibrillation as etiology of stroke tomorrow. This has been explained to patient/family by Dr. Leonie Man and they are agreeable.   We will not perform a TCD bubble given age > 29,  LDL 200  HgbA1c 5.4  UDS positive THC   SCDs for VTE prophylaxis  No antithrombotic prior to admission, now on No antithrombotic as post tPA. Given mild stroke, recommend aspirin 81 mg and plavix 75 mg daily x 3 weeks, then aspirin alone. Orders adjusted.    Therapy recommendations:  Pending, ok to be OOB  Disposition:  pending   Transfer to the floor  Hypertension  Elevated at stroke onset, requiring IV treatment prior to tPA  On cleviprex drip until around 1530 yesterday afternoon  BP has remain within post tPA goal  Stable . Permissive  hypertension 24h post tPA (OK if < 220/120) but gradually normalize in 5-7 days . On lisinopril-HCTZ 20-25 daily, last filled in Feb 2020. Pt states she was tolerating well, just did not refill.   . Add lisinopril 10 and HCTZ 25 . Long-term BP goal normotensive  Hyperlipidemia  Home meds:  lipitor 20 in the past but not taking currently  LDL 200, goal < 70  Increase lipitor to 80   Continue statin at d/c  Other Stroke Risk Factors  Advanced age  Cigarette smoker, advised to stop smoking  ETOH use, alcohol level <10, advised to drink no more than 1 drink(s) a day  THC use  Family hx stroke (mother)  Possible Obstructive sleep apnea, ,needs OP evaluation. Dr. Leonie Man will arrange at time of follow up   Hospital day # 1  I have personally obtained history,examined this patient, reviewed notes, independently viewed imaging studies, participated in medical decision making and plan of care.ROS completed by me personally and pertinent positives fully documented  I have made any additions or clarifications directly to the above note.  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  Recommend close neurological and blood pressure monitoring as per post TPA protocol.  Mobilize out of bed.  Therapy consults.  Continue ongoing stroke work-up.  Check echocardiogram, cardiac monitoring  and likely TEE and loop recorder tomorrow.  Long discussion with the patient and bedside RN and answered questions.This patient is critically ill and at significant risk of neurological worsening, death and care requires constant monitoring of vital signs, hemodynamics,respiratory and cardiac monitoring, extensive review of multiple databases, frequent neurological assessment, discussion with family, other specialists and medical decision making of high complexity.I have made any additions or clarifications  directly to the above note.This critical care time does not reflect procedure time, or teaching time or supervisory time of PA/NP/Med Resident etc but could involve care discussion time.  I spent 30 minutes of neurocritical care time  in the care of  this patient.      Delia Heady, MD Medical Director [facility] Pager: [phone] 03/06/2019 4:05 PM   To contact Stroke Continuity provider, please refer to [email or web address]. After hours, contact General Neurology

## 2019-03-06 NOTE — Progress Notes (Signed)
SLP Cancellation Note  Patient Details Name: Renee Rodriguez MRN: [id number] DOB: [birth year]   Cancelled treatment:       Reason Eval/Treat Not Completed: SLP screened, no needs identified, will sign off  [name] L. Samson Frederic, MA CCC/SLP Acute Rehabilitation Services Office number [phone] Pager [phone]  Blenda Mounts [name] 03/06/2019, 3:15 PM

## 2019-03-06 NOTE — Progress Notes (Signed)
OT Cancellation Note  Patient Details Name: Renee Rodriguez MRN: [id number] DOB: [birth year]   Cancelled Treatment:    Reason Eval/Treat Not Completed: Active bedrest order, will await updated activity orders and follow up for OT evaluation.  Marcy Siren, OT Supplemental Rehabilitation Services Pager [phone] Office [phone]   Orlando Penner 03/06/2019, 8:52 AM

## 2019-03-06 NOTE — Progress Notes (Signed)
STROKE TEAM PROGRESS NOTE   INTERVAL HISTORY I have reviewed history of presenting illness in detail with the patient, electronic medical records and imaging films in PACS.  She presented with sudden onset of left hemiplegia received IV TPA after telemetry consult and is made significant recovery.  Blood pressure adequately controlled.  She has no complaints today.  Follow-up post TPA imaging is pending  Vitals:   03/06/19 0600 03/06/19 0700 03/06/19 0802 03/06/19 1000  BP: (!) 153/96 (!) 148/96 (!) 170/107 (!) 164/93  Pulse: 84 87    Resp: 18 14 (!) 26 (!) 29  Temp:      TempSrc:      SpO2: 99% 100%    Weight:      Height:        CBC:  Recent Labs  Lab 03/05/19 0824 03/05/19 0832  WBC  --  7.2  NEUTROABS  --  4.0  HGB 16.3* 14.7  HCT 48.0* 45.0  MCV  --  91.6  PLT  --  385    Basic Metabolic Panel:  Recent Labs  Lab 03/05/19 0814 03/05/19 0824  NA 138 139  K 4.0 3.8  CL 103 105  CO2 20*  --   GLUCOSE 105* 105*  BUN 9 11  CREATININE 0.63 0.60  CALCIUM 9.3  --    Lipid Panel:     Component Value Date/Time   CHOL 288 (H) 03/06/2019 0635   CHOL 269 (H) 09/25/2017 1649   TRIG 109 03/06/2019 0635   HDL 66 03/06/2019 0635   HDL 69 09/25/2017 1649   CHOLHDL 4.4 03/06/2019 0635   VLDL 22 03/06/2019 0635   LDLCALC 200 (H) 03/06/2019 0635   LDLCALC 165 (H) 09/25/2017 1649   HgbA1c:  Lab Results  Component Value Date   HGBA1C 5.4 03/06/2019   Urine Drug Screen:     Component Value Date/Time   LABOPIA NONE DETECTED 03/06/2019 1023   COCAINSCRNUR NONE DETECTED 03/06/2019 1023   LABBENZ NONE DETECTED 03/06/2019 1023   AMPHETMU NONE DETECTED 03/06/2019 1023   THCU POSITIVE (A) 03/06/2019 1023   LABBARB NONE DETECTED 03/06/2019 1023    Alcohol Level     Component Value Date/Time   ETH <10 07/20/2017 1524    IMAGING past 48 hours CT ANGIO HEAD W OR WO CONTRAST  Result Date: 03/05/2019 CLINICAL DATA:  Right-sided deficit. EXAM: CT ANGIOGRAPHY HEAD AND  NECK TECHNIQUE: Multidetector CT imaging of the head and neck was performed using the standard protocol during bolus administration of intravenous contrast. Multiplanar CT image reconstructions and MIPs were obtained to evaluate the vascular anatomy. Carotid stenosis measurements (when applicable) are obtained utilizing NASCET criteria, using the distal internal carotid diameter as the denominator. CONTRAST:  75mL OMNIPAQUE IOHEXOL 350 MG/ML SOLN COMPARISON:  CT head without contrast 03/05/2019. FINDINGS: CTA NECK FINDINGS Aortic arch: A 3 vessel arch configuration is present. No significant atherosclerotic change, aneurysm, or stenosis is present. Right carotid system: The right common carotid artery is within normal limits. Noncalcified plaque is present at the carotid bifurcation and proximal right internal carotid artery. Minimal calcifications are present as well. There is no significant stenosis relative to the more distal vessel. The cervical right ICA is otherwise normal. There is slight vessel irregularity in the mid cervical right ICA without significant stenosis. Left carotid system: The left common carotid artery is within normal limits. Minimal atherosclerotic changes present at the proximal left ICA without significant stenosis. There is mild irregularity of the vessel in   the mid cervical segment without significant stenosis. Focal calcification is present in the wall the distal left ICA, just below the skull base without significant stenosis. Vertebral arteries: The left vertebral artery is the dominant vessel. It originates from the subclavian artery without significant stenosis. There is no significant stenosis in the left vertebral artery in the neck. The right vertebral artery is occluded. There is no significant reconstitution of the vessel in the neck. Skeleton: Mild endplate degenerative changes are present lower cervical spine. There straightening of the normal cervical lordosis. No focal  lytic or blastic lesions are present. Other neck: The soft tissues the neck are otherwise unremarkable. Salivary glands are within normal limits. No focal mucosal lesions are present. The thyroid is somewhat heterogeneous, but not enlarged. There is no dominant lesion. No followup recommended (ref: J Am Coll Radiol. 2015 Feb;12(2): 143-50). No significant adenopathy is present. Upper chest: The lung apices are clear. Thoracic inlet is within normal limits. Review of the MIP images confirms the above findings CTA HEAD FINDINGS Anterior circulation: Atherosclerotic calcifications are present within the cavernous internal carotid arteries bilaterally. There is no significant stenosis relative to the more distal vessels. ICA termini are within normal limits bilaterally. The A1 and M1 segments are within normal limits. The anterior communicating artery is patent. MCA bifurcations are intact. There is mild distal small vessel irregularity within the ACA and MCA divisions. No significant proximal stenosis or occlusion is present. There is no aneurysm. Posterior circulation: The hypoplastic distal right V4 segment is opacified, likely filling retrograde. Left vertebral artery is within normal limits. PICA origin is visualized and normal. The basilar artery is small. Both posterior cerebral arteries are of fetal type. Small P1 segments contribute, more prominent right than left. Distal segmental narrowing is present within PCA branch vessels without a significant proximal stenosis or occlusion. Venous sinuses: Dural sinuses are patent. The straight sinus deep cerebral veins are intact. Cortical veins are unremarkable. Anatomic variants: Fetal type posterior cerebral arteries bilaterally. Review of the MIP images confirms the above findings IMPRESSION: 1. Occlusion of the right vertebral artery at its origin without significant reconstitution in the neck or V3 segment. 2. A hypoplastic distal right V4 segment is opacified,  likely filling retrograde. 3. Mild atherosclerotic changes at the carotid bifurcations bilaterally without significant stenosis. 4. Mild distal small vessel disease without a significant proximal stenosis, aneurysm, or branch vessel occlusion within the Circle of Willis. 5. Degenerative changes within the cervical spine. Electronically Signed   By: Christopher  Mattern M.D.   On: 03/05/2019 08:55   CT HEAD WO CONTRAST  Result Date: 03/06/2019 CLINICAL DATA:  Stroke, follow-up post tPA EXAM: CT HEAD WITHOUT CONTRAST TECHNIQUE: Contiguous axial images were obtained from the base of the skull through the vertex without intravenous contrast. COMPARISON:  03/05/2019 FINDINGS: Brain: There is no acute intracranial hemorrhage. Ill-defined low attenuation is present along the acute infarction seen on prior MRI involving left basal ganglia and corona radiata. Chronic small vessel infarct of the right thalamus is again noted. Additional patchy hypoattenuation in the supratentorial white matter likely reflects stable mild chronic microvascular ischemic changes. There is no significant mass effect, hydrocephalus, or extra-axial collection. Vascular: There is atherosclerotic calcification at the skull base. Skull: Calvarium is unremarkable. Sinuses/Orbits: No acute finding. Other: None. IMPRESSION: No acute intracranial hemorrhage. Evolving acute infarction of the left basal ganglia and corona radiata. Stable additional chronic findings detailed above. Electronically Signed   By: Praneil  Patel M.D.   On: 03/06/2019   11:46   CT ANGIO NECK W OR WO CONTRAST  Result Date: 03/05/2019 CLINICAL DATA:  Right-sided deficit. EXAM: CT ANGIOGRAPHY HEAD AND NECK TECHNIQUE: Multidetector CT imaging of the head and neck was performed using the standard protocol during bolus administration of intravenous contrast. Multiplanar CT image reconstructions and MIPs were obtained to evaluate the vascular anatomy. Carotid stenosis measurements  (when applicable) are obtained utilizing NASCET criteria, using the distal internal carotid diameter as the denominator. CONTRAST:  75mL OMNIPAQUE IOHEXOL 350 MG/ML SOLN COMPARISON:  CT head without contrast 03/05/2019. FINDINGS: CTA NECK FINDINGS Aortic arch: A 3 vessel arch configuration is present. No significant atherosclerotic change, aneurysm, or stenosis is present. Right carotid system: The right common carotid artery is within normal limits. Noncalcified plaque is present at the carotid bifurcation and proximal right internal carotid artery. Minimal calcifications are present as well. There is no significant stenosis relative to the more distal vessel. The cervical right ICA is otherwise normal. There is slight vessel irregularity in the mid cervical right ICA without significant stenosis. Left carotid system: The left common carotid artery is within normal limits. Minimal atherosclerotic changes present at the proximal left ICA without significant stenosis. There is mild irregularity of the vessel in the mid cervical segment without significant stenosis. Focal calcification is present in the wall the distal left ICA, just below the skull base without significant stenosis. Vertebral arteries: The left vertebral artery is the dominant vessel. It originates from the subclavian artery without significant stenosis. There is no significant stenosis in the left vertebral artery in the neck. The right vertebral artery is occluded. There is no significant reconstitution of the vessel in the neck. Skeleton: Mild endplate degenerative changes are present lower cervical spine. There straightening of the normal cervical lordosis. No focal lytic or blastic lesions are present. Other neck: The soft tissues the neck are otherwise unremarkable. Salivary glands are within normal limits. No focal mucosal lesions are present. The thyroid is somewhat heterogeneous, but not enlarged. There is no dominant lesion. No followup  recommended (ref: J Am Coll Radiol. 2015 Feb;12(2): 143-50). No significant adenopathy is present. Upper chest: The lung apices are clear. Thoracic inlet is within normal limits. Review of the MIP images confirms the above findings CTA HEAD FINDINGS Anterior circulation: Atherosclerotic calcifications are present within the cavernous internal carotid arteries bilaterally. There is no significant stenosis relative to the more distal vessels. ICA termini are within normal limits bilaterally. The A1 and M1 segments are within normal limits. The anterior communicating artery is patent. MCA bifurcations are intact. There is mild distal small vessel irregularity within the ACA and MCA divisions. No significant proximal stenosis or occlusion is present. There is no aneurysm. Posterior circulation: The hypoplastic distal right V4 segment is opacified, likely filling retrograde. Left vertebral artery is within normal limits. PICA origin is visualized and normal. The basilar artery is small. Both posterior cerebral arteries are of fetal type. Small P1 segments contribute, more prominent right than left. Distal segmental narrowing is present within PCA branch vessels without a significant proximal stenosis or occlusion. Venous sinuses: Dural sinuses are patent. The straight sinus deep cerebral veins are intact. Cortical veins are unremarkable. Anatomic variants: Fetal type posterior cerebral arteries bilaterally. Review of the MIP images confirms the above findings IMPRESSION: 1. Occlusion of the right vertebral artery at its origin without significant reconstitution in the neck or V3 segment. 2. A hypoplastic distal right V4 segment is opacified, likely filling retrograde. 3. Mild atherosclerotic changes at   the carotid bifurcations bilaterally without significant stenosis. 4. Mild distal small vessel disease without a significant proximal stenosis, aneurysm, or branch vessel occlusion within the Circle of Willis. 5.  Degenerative changes within the cervical spine. Electronically Signed   By: Christopher  Mattern M.D.   On: 03/05/2019 08:55   MR BRAIN WO CONTRAST  Result Date: 03/06/2019 CLINICAL DATA:  68-year-old female code stroke presentation yesterday with right side deficit. 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 EXAM: MRI HEAD WITHOUT CONTRAST TECHNIQUE: Multiplanar, multiecho pulse sequences of the brain and surrounding structures were obtained without intravenous contrast. COMPARISON:  CTA head and neck and head CT yesterday. FINDINGS: Brain: Linear 12 millimeter restricted diffusion in the posterior left lentiform (series 3, image 17). Separate but nearby small 6 millimeter focus of restricted diffusion in the left posterior periventricular white matter, corona radiata (series 2, image 26). Faint T2 and FLAIR hyperintensity. No associated hemorrhage or mass effect. No other restricted diffusion. Chronic lacunar infarcts in the thalami. Scattered bilateral cerebral white matter T2 and FLAIR hyperintensity which in some and other areas resembles small chronic white matter lacunae (series 7, image 16). Similar patchy T2 and FLAIR hyperintensity in the pons. No cortical encephalomalacia identified. Possible chronic microhemorrhage in the dorsal right thalamus on series 11, image 12. No other chronic cerebral blood products. No midline shift, mass effect, evidence of mass lesion, ventriculomegaly, extra-axial collection or acute intracranial hemorrhage. Cervicomedullary junction and pituitary are within normal limits. Vascular: Major intracranial vascular flow voids are preserved aside from the distal right vertebral artery, where a diminutive V4 segment is faintly apparent. Skull and upper cervical spine: Negative visible cervical spine. Normal bone marrow signal. Sinuses/Orbits: Negative orbits. Paranasal sinuses are well pneumatized. Other: Mastoids are clear. Grossly normal visible internal auditory  structures. Scalp and face soft tissues appear negative. IMPRESSION: 1. Small acute lacunar infarcts in the posterior left lentiform nucleus and the posterior left corona radiata. No associated hemorrhage or mass effect. 2. Underlying chronic small vessel disease, with a prominent chronic lacunar infarct and microhemorrhage in the right thalamus. 3. Evidence of reconstituted diminutive right vertebral V4 and/or PICA in the setting of the cervical Right Vertebral Artery occlusion demonstrated yesterday. Electronically Signed   By: H  Hall M.D.   On: 03/06/2019 09:23   ECHOCARDIOGRAM COMPLETE  Result Date: 03/05/2019   ECHOCARDIOGRAM REPORT   Patient Name:   Renee Rodriguez Date of Exam: 03/05/2019 Medical Rec #:  [id number]       Height:       60.0 in Accession #:    [id number]      Weight:       127.0 lb Date of Birth:  08/24/1950       BSA:          1.54 m Patient Age:    68 years        BP:           152/89 mmHg Patient Gender: F               HR:           78 bpm. Exam Location:  Inpatient Procedure: 2D Echo Indications:    Stroke 434.91/I163.9  History:        Patient has no prior history of Echocardiogram examinations.                 Risk Factors:Hypertension.  Sonographer:    Arthur Guy RDCS (AE) Referring Phys: [id number] DAVID R SMITH    Sonographer Comments: Patient moving constantly throughout exam. IMPRESSIONS  1. Left ventricular ejection fraction, by visual estimation, is 60 to 65%. The left ventricle has normal function. Left ventricular septal wall thickness was mildly increased. There is mildly increased left ventricular hypertrophy.  2. Left ventricular diastolic parameters are consistent with Grade I diastolic dysfunction (impaired relaxation).  3. Global right ventricle has normal systolic function.The right ventricular size is normal. No increase in right ventricular wall thickness.  4. Left atrial size was mildly dilated.  5. Right atrial size was normal.  6. The mitral valve is normal in structure. No  evidence of mitral valve regurgitation. No evidence of mitral stenosis.  7. The tricuspid valve is normal in structure.  8. The aortic valve is tricuspid. Aortic valve regurgitation is not visualized. No evidence of aortic valve sclerosis or stenosis.  9. The pulmonic valve was normal in structure. Pulmonic valve regurgitation is not visualized. 10. The inferior vena cava is normal in size with greater than 50% respiratory variability, suggesting right atrial pressure of 3 mmHg. 11. No intracardiac source of emboli noted. FINDINGS  Left Ventricle: Left ventricular ejection fraction, by visual estimation, is 60 to 65%. The left ventricle has normal function. The left ventricle has no regional wall motion abnormalities. There is mildly increased left ventricular hypertrophy. Left ventricular diastolic parameters are consistent with Grade I diastolic dysfunction (impaired relaxation). Normal left atrial pressure. Right Ventricle: The right ventricular size is normal. No increase in right ventricular wall thickness. Global RV systolic function is has normal systolic function. Left Atrium: Left atrial size was mildly dilated. Right Atrium: Right atrial size was normal in size Pericardium: There is no evidence of pericardial effusion. Mitral Valve: The mitral valve is normal in structure. No evidence of mitral valve regurgitation. No evidence of mitral valve stenosis by observation. MV peak gradient, 3.9 mmHg. Tricuspid Valve: The tricuspid valve is normal in structure. Tricuspid valve regurgitation is trivial. Aortic Valve: The aortic valve is tricuspid. Aortic valve regurgitation is not visualized. The aortic valve is structurally normal, with no evidence of sclerosis or stenosis. Pulmonic Valve: The pulmonic valve was normal in structure. Pulmonic valve regurgitation is not visualized. Pulmonic regurgitation is not visualized. Aorta: The aortic root and ascending aorta are structurally normal, with no evidence of  dilitation. Venous: The inferior vena cava is normal in size with greater than 50% respiratory variability, suggesting right atrial pressure of 3 mmHg. IAS/Shunts: No atrial level shunt detected by color flow Doppler. There is no evidence of a patent foramen ovale. No ventricular septal defect is seen or detected. There is no evidence of an atrial septal defect.  LEFT VENTRICLE PLAX 2D LVIDd:         3.57 cm  Diastology LVIDs:         1.92 cm  LV e' lateral:   6.85 cm/s LV PW:         1.14 cm  LV E/e' lateral: 7.8 LV IVS:        1.30 cm  LV e' medial:    5.00 cm/s LVOT diam:     1.80 cm  LV E/e' medial:  10.7 LV SV:         42 ml LV SV Index:   26.60 LVOT Area:     2.54 cm  RIGHT VENTRICLE             IVC RV Basal diam:  3.44 cm     IVC diam: 1.59 cm RV S prime:       10.10 cm/s TAPSE (M-mode): 2.3 cm LEFT ATRIUM             Index       RIGHT ATRIUM           Index LA diam:        2.90 cm 1.88 cm/m  RA Area:     14.20 cm LA Vol (A2C):   53.7 ml 34.89 ml/m RA Volume:   36.70 ml  23.85 ml/m LA Vol (A4C):   48.9 ml 31.77 ml/m LA Biplane Vol: 52.6 ml 34.18 ml/m  AORTIC VALVE LVOT Vmax:   125.00 cm/s LVOT Vmean:  78.400 cm/s LVOT VTI:    0.225 m  AORTA Ao Root diam: 3.00 cm Ao Asc diam:  3.30 cm MITRAL VALVE MV Area (PHT): 2.48 cm             SHUNTS MV Peak grad:  3.9 mmHg             Systemic VTI:  0.22 m MV Mean grad:  1.0 mmHg             Systemic Diam: 1.80 cm MV Vmax:       0.99 m/s MV Vmean:      53.4 cm/s MV VTI:        0.21 m MV PHT:        88.74 msec MV Decel Time: 306 msec MV E velocity: 53.60 cm/s 103 cm/s MV A velocity: 96.80 cm/s 70.3 cm/s MV E/A ratio:  0.55       1.5  Gayatri Acharya MD Electronically signed by Gayatri Acharya MD Signature Date/Time: 03/05/2019/10:24:50 PM    Final    CT HEAD CODE STROKE WO CONTRAST  Result Date: 03/05/2019 CLINICAL DATA:  Code stroke.  Right-sided deficit EXAM: CT HEAD WITHOUT CONTRAST TECHNIQUE: Contiguous axial images were obtained from the base of the skull  through the vertex without intravenous contrast. COMPARISON:  None. FINDINGS: Brain: Ventricle size normal. Hypodensity right thalamus compatible with infarct, probably chronic. Small white matter hypodensities compatible with ischemia, likely chronic. Small chronic infarct left caudate. Negative for acute cortical infarct. Negative for hemorrhage or mass. Vascular: Negative for hyperdense vessel Skull: Negative Sinuses/Orbits: Mild mucosal edema paranasal sinuses. Negative orbit. Other: None ASPECTS ([place] Stroke Program Early CT Score) - Ganglionic level infarction (caudate, lentiform nuclei, internal capsule, insula, M1-M3 cortex): 7 - Supraganglionic infarction (M4-M6 cortex): 3 Total score (0-10 with 10 being normal): 10 IMPRESSION: 1. No acute intracranial abnormality. 2. ASPECTS is 10 3. Mild white matter ischemia. Right thalamic infarct appears chronic. No prior studies for comparison. 4. These results were called by telephone at the time of interpretation on 03/05/2019 at 8:30 am to provider Aroor , who verbally acknowledged these results. Electronically Signed   By: Charles  Clark M.D.   On: 03/05/2019 08:31    PHYSICAL EXAM Pleasant frail middle-aged African-American lady not in distress. . Afebrile. Head is nontraumatic. Neck is supple without bruit.    Cardiac exam no murmur or gallop. Lungs are clear to auscultation. Distal pulses are well felt. Neurological Exam ;  Awake  Alert oriented x 3. Normal speech and language.eye movements full without nystagmus.fundi were not visualized. Vision acuity and fields appear normal. Hearing is normal. Palatal movements are normal. Face symmetric. Tongue midline. Normal strength, tone, reflexes and coordination. Normal sensation. Gait deferred.  ASSESSMENT/PLAN [name] is a 68 y.o. female with history of hypertension, GERD, arthritis and anemia presenting with R sided weakness   and inability to get her words out. Received tPA 03/05/2019 at 0840.    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 Small vessel disease. Old R thalamic infarct. ASPECTS 10.     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 Mild ICA bifurcation atherosclerosis. Mild small vessel disease. Degenerative CS.  MRI  Small posterior lentiform nucleus and corona radiata infarct > 2cm in size. Small vessel disease. Old R thalamic lacune and micro hemorrhage. R VA cervical artery occlusion.   Repeat CT head 24h no hemorrhage. evolving L basal ganglia and corona radiata infarct   2D Echo EF 60-65%. No source of embolus. RA dilated.   TEE to look for embolic source. Requested with [facility] for tomorrow.  If unable to do tomorrow, we will not do acutely.  If TEE done and positive for PFO (patent foramen ovale), check bilateral lower extremity venous dopplers to rule out DVT as possible source of stroke. (I have made patient NPO after midnight tonight).   If TEE negative, a [facility] electrophysiologist will consult and consider placement of an implantable loop recorder to evaluate for atrial fibrillation as etiology of stroke tomorrow. This has been explained to patient/family by Dr. Julious Langlois and they are agreeable.   We will not perform a TCD bubble given age > 65,  LDL 200  HgbA1c 5.4  UDS positive THC   SCDs for VTE prophylaxis  No antithrombotic prior to admission, now on No antithrombotic as post tPA. Given mild stroke, recommend aspirin 81 mg and plavix 75 mg daily x 3 weeks, then aspirin alone. Orders adjusted.    Therapy recommendations:  Pending, ok to be OOB  Disposition:  pending   Transfer to the floor  Hypertension  Elevated at stroke onset, requiring IV treatment prior to tPA  On cleviprex drip until around 1530 yesterday afternoon  BP has remain within post tPA goal  Stable . Permissive  hypertension 24h post tPA (OK if < 220/120) but gradually normalize in 5-7 days . On lisinopril-HCTZ 20-25 daily, last filled in Feb 2020. Pt states she was tolerating well, just did not refill.   . Add lisinopril 10 and HCTZ 25 . Long-term BP goal normotensive  Hyperlipidemia  Home meds:  lipitor 20 in the past but not taking currently  LDL 200, goal < 70  Increase lipitor to 80   Continue statin at d/c  Other Stroke Risk Factors  Advanced age  Cigarette smoker, advised to stop smoking  ETOH use, alcohol level <10, advised to drink no more than 1 drink(s) a day  THC use  Family hx stroke (mother)  Possible Obstructive sleep apnea, ,needs OP evaluation. Dr. Wildon Cuevas will arrange at time of follow up   Hospital day # 1  I have personally obtained history,examined this patient, reviewed notes, independently viewed imaging studies, participated in medical decision making and plan of care.ROS completed by me personally and pertinent positives fully documented  I have made any additions or clarifications directly to the above note.  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  Recommend close neurological and blood pressure monitoring as per post TPA protocol.  Mobilize out of bed.  Therapy consults.  Continue ongoing stroke work-up.  Check echocardiogram, cardiac monitoring   and likely TEE and loop recorder tomorrow.  Long discussion with the patient and bedside RN and answered questions.This patient is critically ill and at significant risk of neurological worsening, death and care requires constant monitoring of vital signs, hemodynamics,respiratory and cardiac monitoring, extensive review of multiple databases, frequent neurological assessment, discussion with family, other specialists and medical decision making of high complexity.I have made any additions or clarifications  directly to the above note.This critical care time does not reflect procedure time, or teaching time or supervisory time of PA/NP/Med Resident etc but could involve care discussion time.  I spent 30 minutes of neurocritical care time  in the care of  this patient.      Keondre Markson, MD Medical Director [facility] Pager: [phone] 03/06/2019 4:05 PM   To contact Stroke Continuity provider, please refer to [email or web address]. After hours, contact General Neurology 

## 2019-03-06 NOTE — Evaluation (Signed)
Physical Therapy Evaluation Patient Details Name: Renee Rodriguez MRN: [id number] DOB: May 17, 1950 Today's Date: 03/06/2019   History of Present Illness  69 y.o. female with history of hypertension, GERD, arthritis and anemia.  Patient presented to the hospital as a code stroke with R numbness and weakness as well as expressive aphasia.  Clinical Impression  Pt presents to PT with deficits in functional mobility, gait, balance, endurance, power, and coordination. Pt with R sided weakness, more prominent in UE than LE, leading to impaired gait and increased falls risk. Pt with reduced balance and R knee buckling without UE support. Pt will benefit from acute PT POC to improve upon strength, balance, and gait, and aide in a return to independent mobility.    Follow Up Recommendations Outpatient PT;Supervision - Intermittent    Equipment Recommendations  Rolling walker with 5" wheels(may progress to no needs vs cane)    Recommendations for Other Services       Precautions / Restrictions Precautions Precautions: Fall Restrictions Weight Bearing Restrictions: No      Mobility  Bed Mobility Overal bed mobility: Modified Independent             General bed mobility comments: increased time  Transfers Overall transfer level: Needs assistance Equipment used: Rolling walker (2 wheeled) Transfers: Sit to/from Stand Sit to Stand: Supervision            Ambulation/Gait Ambulation/Gait assistance: Supervision Gait Distance (Feet): 60 Feet Assistive device: Rolling walker (2 wheeled) Gait Pattern/deviations: Step-to pattern Gait velocity: reduced Gait velocity interpretation: <1.8 ft/sec, indicate of risk for recurrent falls General Gait Details: pt with shortened step to gait with reduced step length on RLE initially but increasing with more ambulation  Stairs            Wheelchair Mobility    Modified Rankin (Stroke Patients Only) Modified Rankin (Stroke Patients  Only) Pre-Morbid Rankin Score: No symptoms Modified Rankin: Moderate disability     Balance Overall balance assessment: Needs assistance Sitting-balance support: No upper extremity supported;Feet supported Sitting balance-Leahy Scale: Good Sitting balance - Comments: modI   Standing balance support: Bilateral upper extremity supported Standing balance-Leahy Scale: Good Standing balance comment: close supervision with BUE support of RW                             Pertinent Vitals/Pain Pain Assessment: No/denies pain    Home Living Family/patient expects to be discharged to:: Private residence Living Arrangements: Children;Other relatives Available Help at Discharge: Family;Available 24 hours/day Type of Home: House Home Access: Stairs to enter Entrance Stairs-Rails: None Entrance Stairs-Number of Steps: 2 Home Layout: One level Home Equipment: None      Prior Function Level of Independence: Independent               Hand Dominance   Dominant Hand: Right    Extremity/Trunk Assessment   Upper Extremity Assessment Upper Extremity Assessment: RUE deficits/detail RUE Deficits / Details: Grossly 4-/5 RUE Sensation: WNL RUE Coordination: (gross motor WFL, fine motor deferred to OT)    Lower Extremity Assessment Lower Extremity Assessment: RLE deficits/detail RLE Deficits / Details: Ankle PF/DF 4-/5, knee extension 4/5 RLE Sensation: WNL RLE Coordination: WNL    Cervical / Trunk Assessment Cervical / Trunk Assessment: Normal  Communication   Communication: No difficulties  Cognition Arousal/Alertness: Awake/alert Behavior During Therapy: WFL for tasks assessed/performed Overall Cognitive Status: Within Functional Limits for tasks assessed  General Comments General comments (skin integrity, edema, etc.): VSS    Exercises     Assessment/Plan    PT Assessment Patient needs continued PT  services  PT Problem List Decreased strength;Decreased activity tolerance;Decreased balance;Decreased mobility;Decreased coordination;Decreased knowledge of use of DME;Decreased safety awareness;Decreased knowledge of precautions       PT Treatment Interventions DME instruction;Gait training;Stair training;Functional mobility training;Therapeutic activities;Therapeutic exercise;Balance training;Neuromuscular re-education;Patient/family education    PT Goals (Current goals can be found in the Care Plan section)  Acute Rehab PT Goals Patient Stated Goal: To return to independent mobility PT Goal Formulation: With patient Time For Goal Achievement: 03/20/19 Potential to Achieve Goals: Good Additional Goals Additional Goal #1: Pt will maintain dynamic standing balance within 10 inches of her base of support without UE support with supervision.    Frequency Min 4X/week   Barriers to discharge        Co-evaluation               AM-PAC PT "6 Clicks" Mobility  Outcome Measure Help needed turning from your back to your side while in a flat bed without using bedrails?: None Help needed moving from lying on your back to sitting on the side of a flat bed without using bedrails?: None Help needed moving to and from a bed to a chair (including a wheelchair)?: None Help needed standing up from a chair using your arms (e.g., wheelchair or bedside chair)?: None Help needed to walk in hospital room?: None Help needed climbing 3-5 steps with a railing? : A Lot 6 Click Score: 22    End of Session Equipment Utilized During Treatment: (none) Activity Tolerance: Patient tolerated treatment well Patient left: in chair;with call bell/phone within reach Nurse Communication: Mobility status PT Visit Diagnosis: Unsteadiness on feet (R26.81)    Time: 5366-4403 PT Time Calculation (min) (ACUTE ONLY): 30 min   Charges:   PT Evaluation $PT Eval Moderate Complexity: 1 Mod PT Treatments $Gait  Training: 8-22 mins        Zenaida Niece, PT, DPT Acute Rehabilitation Pager: [phone]   Zenaida Niece 03/06/2019, 12:41 PM

## 2019-03-06 NOTE — Progress Notes (Signed)
Chaplain engaged in initial visit with [name].  [name] shared her testimony with chaplain and expressed she was in need of some encouragement.  Chaplain and [name] prayed together.  Chaplain will continue to follow-up.

## 2019-03-06 NOTE — Progress Notes (Signed)
SLP Cancellation Note  Patient Details Name: Renee Rodriguez MRN: [id number] DOB: 02-22-1951   Cancelled treatment:  Pt in MRI; will return for speech/language evaluation.  Myrta Mercer L. Samson Frederic, MA CCC/SLP Acute Rehabilitation Services Office number [phone] Pager [phone]          Blenda Mounts [name] 03/06/2019, 11:28 AM

## 2019-03-07 ENCOUNTER — Inpatient Hospital Stay (HOSPITAL_COMMUNITY): Payer: Medicare Other

## 2019-03-07 ENCOUNTER — Inpatient Hospital Stay (HOSPITAL_COMMUNITY): Payer: Medicare Other | Admitting: Anesthesiology

## 2019-03-07 ENCOUNTER — Encounter (HOSPITAL_COMMUNITY)
Admission: EM | Disposition: A | Discharge status: Home / Self Care | Payer: Self-pay | Source: Home / Self Care | Attending: Neurology

## 2019-03-07 ENCOUNTER — Encounter (HOSPITAL_COMMUNITY): Payer: Self-pay | Admitting: Neurology

## 2019-03-07 DIAGNOSIS — I6389 Other cerebral infarction: Secondary | ICD-10-CM

## 2019-03-07 DIAGNOSIS — F17210 Nicotine dependence, cigarettes, uncomplicated: Secondary | ICD-10-CM | POA: Diagnosis present

## 2019-03-07 DIAGNOSIS — I63412 Cerebral infarction due to embolism of left middle cerebral artery: Principal | ICD-10-CM

## 2019-03-07 DIAGNOSIS — E785 Hyperlipidemia, unspecified: Secondary | ICD-10-CM | POA: Diagnosis present

## 2019-03-07 DIAGNOSIS — Z823 Family history of stroke: Secondary | ICD-10-CM

## 2019-03-07 HISTORY — PX: TEE WITHOUT CARDIOVERSION: SHX5443

## 2019-03-07 HISTORY — PX: BUBBLE STUDY: SHX6837

## 2019-03-07 HISTORY — PX: LOOP RECORDER INSERTION: EP1214

## 2019-03-07 SURGERY — ECHOCARDIOGRAM, TRANSESOPHAGEAL
Anesthesia: Monitor Anesthesia Care

## 2019-03-07 SURGERY — LOOP RECORDER INSERTION

## 2019-03-07 MED ORDER — LIDOCAINE-EPINEPHRINE 1 %-1:100000 IJ SOLN
INTRAMUSCULAR | Status: AC
  Filled 2019-03-07: qty 1

## 2019-03-07 MED ORDER — PROPOFOL 10 MG/ML IV BOLUS
INTRAVENOUS | Status: DC | PRN
  Administered 2019-03-07 (×2): 20 mg via INTRAVENOUS

## 2019-03-07 MED ORDER — ONDANSETRON HCL 4 MG/2ML IJ SOLN
INTRAMUSCULAR | Status: DC | PRN
  Administered 2019-03-07: 4 mg via INTRAVENOUS

## 2019-03-07 MED ORDER — ASPIRIN 81 MG PO TBEC: 81.0000 mg | DELAYED_RELEASE_TABLET | Freq: Every day | ORAL | Status: DC

## 2019-03-07 MED ORDER — LIDOCAINE-EPINEPHRINE 1 %-1:100000 IJ SOLN
INTRAMUSCULAR | Status: DC | PRN
  Administered 2019-03-07: 30 mL

## 2019-03-07 MED ORDER — ATORVASTATIN CALCIUM 80 MG PO TABS: 80.0000 mg | ORAL_TABLET | Freq: Every day | ORAL | 2 refills | Status: DC

## 2019-03-07 MED ORDER — ONDANSETRON HCL 4 MG/2ML IJ SOLN
INTRAMUSCULAR | Status: AC
  Filled 2019-03-07: qty 2

## 2019-03-07 MED ORDER — CLOPIDOGREL BISULFATE 75 MG PO TABS: 75.0000 mg | ORAL_TABLET | Freq: Every day | ORAL | 0 refills | Status: DC

## 2019-03-07 MED ORDER — PROPOFOL 500 MG/50ML IV EMUL
INTRAVENOUS | Status: DC | PRN
  Administered 2019-03-07: 150 ug/kg/min via INTRAVENOUS

## 2019-03-07 MED ORDER — LIDOCAINE 2% (20 MG/ML) 5 ML SYRINGE
INTRAMUSCULAR | Status: DC | PRN
  Administered 2019-03-07: 60 mg via INTRAVENOUS

## 2019-03-07 MED ORDER — LISINOPRIL 20 MG PO TABS: 20.0000 mg | ORAL_TABLET | Freq: Every day | ORAL | 2 refills | Status: DC

## 2019-03-07 MED ORDER — LACTATED RINGERS IV SOLN: INTRAVENOUS | Status: DC

## 2019-03-07 MED ORDER — HYDROCHLOROTHIAZIDE 25 MG PO TABS: 25.0000 mg | ORAL_TABLET | Freq: Every day | ORAL | 2 refills | Status: AC

## 2019-03-07 MED ORDER — SODIUM CHLORIDE 0.9 % IV SOLN: INTRAVENOUS | Status: DC

## 2019-03-07 MED FILL — CLOPIDOGREL 75 MG TABLET: 75 | 21 days supply | Qty: 21 | Fill #0

## 2019-03-07 MED FILL — HYDROCHLOROTHIAZIDE 25 MG T: 25 | 30 days supply | Qty: 30 | Fill #0

## 2019-03-07 MED FILL — ATORVASTATIN 80 MG TABLET: 80 | 30 days supply | Qty: 30 | Fill #0

## 2019-03-07 MED FILL — LISINOPRIL 20 MG TABLET: 20 | 30 days supply | Qty: 30 | Fill #0

## 2019-03-07 SURGICAL SUPPLY — 2 items
MONITOR REVEAL LINQ II (Prosthesis & Implant Heart) ×1 IMPLANT
PACK LOOP INSERTION (CUSTOM PROCEDURE TRAY) ×2 IMPLANT

## 2019-03-07 NOTE — Anesthesia Preprocedure Evaluation (Addendum)
Anesthesia Evaluation  Patient identified by MRN, date of birth, ID band Patient awake    Reviewed: Allergy & Precautions, H&P , NPO status , Patient's Chart, lab work & pertinent test results  History of Anesthesia Complications Negative for: history of anesthetic complications  Airway Mallampati: II  TM Distance: >3 FB Neck ROM: Full    Dental  (+) Partial Upper   Pulmonary former smoker,    breath sounds clear to auscultation       Cardiovascular hypertension (not currently on meds),  Rhythm:Regular Rate:Normal     Neuro/Psych CVA, No Residual Symptoms negative psych ROS   GI/Hepatic Neg liver ROS, GERD  Controlled,  Endo/Other  negative endocrine ROS  Renal/GU negative Renal ROS     Musculoskeletal  (+) Arthritis ,   Abdominal   Peds  Hematology negative hematology ROS (+)   Anesthesia Other Findings   Reproductive/Obstetrics                            Anesthesia Physical Anesthesia Plan  ASA: III  Anesthesia Plan: MAC   Post-op Pain Management:    Induction: Intravenous  PONV Risk Score and Plan: 1 and Propofol infusion and Treatment may vary due to age or medical condition  Airway Management Planned: Nasal Cannula and Natural Airway  Additional Equipment: None  Intra-op Plan:   Post-operative Plan:   Informed Consent: I have reviewed the patients History and Physical, chart, labs and discussed the procedure including the risks, benefits and alternatives for the proposed anesthesia with the patient or authorized representative who has indicated his/her understanding and acceptance.       Plan Discussed with: CRNA and Anesthesiologist  Anesthesia Plan Comments:        Anesthesia Quick Evaluation

## 2019-03-07 NOTE — CV Procedure (Signed)
   Transesophageal Echocardiogram  Indications: Stroke  Time out performed  Propofol administered by anesthesia is guidance  Findings:  Left Ventricle: Normal EF  Mitral Valve: No mitral regurgitation  Aortic Valve: Normal  Tricuspid Valve: Trivial TR  Left Atrium: No left atrial appendage thrombus  Bubble Contrast Study: Negative bubble study with no shunt  Impression: No intracardiac thrombus, no shunt.  Donato Schultz, MD

## 2019-03-07 NOTE — Plan of Care (Signed)
Problem: Coping: Goal: Will identify appropriate support needs 03/07/2019 1759 by Tenna Child, RN Outcome: Adequate for Discharge 03/07/2019 1759 by Tenna Child, RN Outcome: Progressing 03/07/2019 1759 by Tenna Child, RN Outcome: Progressing   Problem: Education: Goal: Knowledge of General Education information will improve Description: Including pain rating scale, medication(s)/side effects and non-pharmacologic comfort measures 03/07/2019 1759 by Tenna Child, RN Outcome: Adequate for Discharge 03/07/2019 1759 by Tenna Child, RN Outcome: Progressing 03/07/2019 1759 by Tenna Child, RN Outcome: Progressing   Problem: Health Behavior/Discharge Planning: Goal: Ability to manage health-related needs will improve 03/07/2019 1759 by Tenna Child, RN Outcome: Adequate for Discharge 03/07/2019 1759 by Tenna Child, RN Outcome: Progressing 03/07/2019 1759 by Tenna Child, RN Outcome: Progressing   Problem: Clinical Measurements: Goal: Ability to maintain clinical measurements within normal limits will improve 03/07/2019 1759 by Tenna Child, RN Outcome: Adequate for Discharge 03/07/2019 1759 by Tenna Child, RN Outcome: Progressing 03/07/2019 1759 by Tenna Child, RN Outcome: Progressing Goal: Will remain free from infection 03/07/2019 1759 by Tenna Child, RN Outcome: Adequate for Discharge 03/07/2019 1759 by Tenna Child, RN Outcome: Progressing 03/07/2019 1759 by Tenna Child, RN Outcome: Progressing Goal: Diagnostic test results will improve 03/07/2019 1759 by Tenna Child, RN Outcome: Adequate for Discharge 03/07/2019 1759 by Tenna Child, RN Outcome: Progressing 03/07/2019 1759 by Tenna Child, RN Outcome: Progressing Goal: Respiratory complications will improve 03/07/2019 1759 by Tenna Child, RN Outcome: Adequate for Discharge 03/07/2019 1759 by Tenna Child, RN Outcome: Progressing 03/07/2019 1759 by Tenna Child, RN Outcome: Progressing Goal: Cardiovascular complication  will be avoided 03/07/2019 1759 by Tenna Child, RN Outcome: Adequate for Discharge 03/07/2019 1759 by Tenna Child, RN Outcome: Progressing 03/07/2019 1759 by Tenna Child, RN Outcome: Progressing   Problem: Activity: Goal: Risk for activity intolerance will decrease 03/07/2019 1759 by Tenna Child, RN Outcome: Adequate for Discharge 03/07/2019 1759 by Tenna Child, RN Outcome: Progressing 03/07/2019 1759 by Tenna Child, RN Outcome: Progressing   Problem: Nutrition: Goal: Adequate nutrition will be maintained 03/07/2019 1759 by Tenna Child, RN Outcome: Adequate for Discharge 03/07/2019 1759 by Tenna Child, RN Outcome: Progressing 03/07/2019 1759 by Tenna Child, RN Outcome: Progressing   Problem: Coping: Goal: Level of anxiety will decrease 03/07/2019 1759 by Tenna Child, RN Outcome: Adequate for Discharge 03/07/2019 1759 by Tenna Child, RN Outcome: Progressing 03/07/2019 1759 by Tenna Child, RN Outcome: Progressing   Problem: Elimination: Goal: Will not experience complications related to bowel motility 03/07/2019 1759 by Tenna Child, RN Outcome: Adequate for Discharge 03/07/2019 1759 by Tenna Child, RN Outcome: Progressing 03/07/2019 1759 by Tenna Child, RN Outcome: Progressing Goal: Will not experience complications related to urinary retention 03/07/2019 1759 by Tenna Child, RN Outcome: Adequate for Discharge 03/07/2019 1759 by Tenna Child, RN Outcome: Progressing 03/07/2019 1759 by Tenna Child, RN Outcome: Progressing   Problem: Pain Managment: Goal: General experience of comfort will improve 03/07/2019 1759 by Tenna Child, RN Outcome: Adequate for Discharge 03/07/2019 1759 by Tenna Child, RN Outcome: Progressing 03/07/2019 1759 by Tenna Child, RN Outcome: Progressing   Problem: Safety: Goal: Ability to remain free from injury will improve 03/07/2019 1759 by Tenna Child, RN Outcome: Adequate for Discharge 03/07/2019 1759 by Tenna Child, RN Outcome:  Progressing 03/07/2019 1759 by Tenna Child, RN Outcome: Progressing  Problem: Skin Integrity: Goal: Risk for impaired skin integrity will decrease 03/07/2019 1759 by Alma Friendly, RN Outcome: Adequate for Discharge 03/07/2019 1759 by Alma Friendly, RN Outcome: Progressing 03/07/2019 1759 by Alma Friendly, RN Outcome: Progressing

## 2019-03-07 NOTE — Interval H&P Note (Signed)
History and Physical Interval Note:  03/07/2019 1:51 PM  Renee Rodriguez  has presented today for surgery, with the diagnosis of stroke.  The various methods of treatment have been discussed with the patient and family. After consideration of risks, benefits and other options for treatment, the patient has consented to  Procedure(s): TRANSESOPHAGEAL ECHOCARDIOGRAM (TEE) (N/A) as a surgical intervention.  The patient's history has been reviewed, patient examined, no change in status, stable for surgery.  I have reviewed the patient's chart and labs.  Questions were answered to the patient's satisfaction.     [employer]

## 2019-03-07 NOTE — Progress Notes (Signed)
  Echocardiogram Echocardiogram Transesophageal has been performed.  Leta Jungling M 03/07/2019, 2:29 PM

## 2019-03-07 NOTE — Progress Notes (Signed)
Occupational Therapy Treatment and Discharge Patient Details Name: Renee Rodriguez MRN: [id number] DOB: 1950-06-12 Today's Date: 03/07/2019    History of present illness 69 y.o. female with history of hypertension, GERD, arthritis and anemia.  Patient presented to the hospital as a code stroke with R numbness and weakness as well as expressive aphasia.   OT comments  Pt is functioning independent in ADL. Educated and performed fine motor HEP and theraputty exercises with R hand. No further acute OT needs.   Follow Up Recommendations  Outpatient OT;Supervision/Assistance - 24 hour(OP neuro)    Equipment Recommendations  None recommended by OT    Recommendations for Other Services      Precautions / Restrictions Precautions Precautions: None Restrictions Weight Bearing Restrictions: No       Mobility Bed Mobility Overal bed mobility: (pt received sitting in recliner)                Transfers Overall transfer level: Independent Equipment used: None   Sit to Stand: Independent              Balance Overall balance assessment: Independent                                         ADL either performed or assessed with clinical judgement   ADL Overall ADL's : Independent                                             Vision       Perception     Praxis      Cognition Arousal/Alertness: Awake/alert Behavior During Therapy: WFL for tasks assessed/performed Overall Cognitive Status: Within Functional Limits for tasks assessed                                          Exercises Other Exercises Other Exercises: educated in fine motor home exercise program and theraputty exercises with med soft putty   Shoulder Instructions       General Comments VSS    Pertinent Vitals/ Pain       Pain Assessment: No/denies pain  Home Living                                          Prior  Functioning/Environment              Frequency  Min 2X/week        Progress Toward Goals  OT Goals(current goals can now be found in the care plan section)  Progress towards OT goals: Goals met/education completed, patient discharged from OT  Acute Rehab OT Goals Patient Stated Goal: to take better care of myself OT Goal Formulation: With patient  Plan Discharge plan remains appropriate    Co-evaluation                 AM-PAC OT "6 Clicks" Daily Activity     Outcome Measure   Help from another person eating meals?: None Help from another person taking care of personal grooming?: None Help from another person toileting, which includes  using toliet, bedpan, or urinal?: None Help from another person bathing (including washing, rinsing, drying)?: None Help from another person to put on and taking off regular upper body clothing?: None Help from another person to put on and taking off regular lower body clothing?: None 6 Click Score: 24    End of Session    OT Visit Diagnosis: Muscle weakness (generalized) (M62.81);Other symptoms and signs involving the nervous system (R29.898);Hemiplegia and hemiparesis Hemiplegia - Right/Left: Right Hemiplegia - dominant/non-dominant: Dominant   Activity Tolerance Patient tolerated treatment well   Patient Left in chair;with call bell/phone within reach   Nurse Communication          Time: 1030-1046 OT Time Calculation (min): 16 min  Charges: OT General Charges $OT Visit: 1 Visit OT Treatments $Therapeutic Activity: 8-22 mins  Nestor Lewandowsky, OTR/L Acute Rehabilitation Services Pager: [phone] Office: [phone]   Malka So 03/07/2019, 12:22 PM

## 2019-03-07 NOTE — Transfer of Care (Signed)
Immediate Anesthesia Transfer of Care Note  Patient: Renee Rodriguez  Procedure(s) Performed: TRANSESOPHAGEAL ECHOCARDIOGRAM (TEE) (N/A ) BUBBLE STUDY  Patient Location: Endoscopy Unit  Anesthesia Type:MAC  Level of Consciousness: drowsy  Airway & Oxygen Therapy: Patient Spontanous Breathing and Patient connected to nasal cannula oxygen  Post-op Assessment: Report given to RN and Post -op Vital signs reviewed and stable  Post vital signs: Reviewed and stable  Last Vitals:  Vitals Value Taken Time  BP    Temp    Pulse    Resp    SpO2      Last Pain:  Vitals:   03/07/19 1312  TempSrc: Oral  PainSc: 0-No pain         Complications: No apparent anesthesia complications

## 2019-03-07 NOTE — Interval H&P Note (Signed)
History and Physical Interval Note:  03/07/2019 11:06 AM  Renee Rodriguez  has presented today for surgery, with the diagnosis of stroke.  The various methods of treatment have been discussed with the patient and family. After consideration of risks, benefits and other options for treatment, the patient has consented to  Procedure(s): LOOP RECORDER INSERTION (N/A) as a surgical intervention.  The patient's history has been reviewed, patient examined, no change in status, stable for surgery.  I have reviewed the patient's chart and labs.  Questions were answered to the patient's satisfaction.     Hillis Range

## 2019-03-07 NOTE — Progress Notes (Signed)
Physical Therapy Treatment and Discharge Patient Details Name: Renee Rodriguez MRN: [id number] DOB: [birth year] Today's Date: 03/07/2019    History of Present Illness 69 y.o. female with history of hypertension, GERD, arthritis and anemia.  Patient presented to the hospital as a code stroke with R numbness and weakness as well as expressive aphasia.    PT Comments    Pt tolerated treatment well demonstrating improved gait and balance quality. Pt no longer requiring UE support and able to perform dynamic gait and balance tasks independently at this time. Pt declines the need to perform stair negotiation. Pt reports being at or near her functional baseline. Pt requires no further acute PT needs, acute PT signing off. No PT or DME needed at time of discharge.   Follow Up Recommendations  No PT follow up     Equipment Recommendations  None recommended by PT    Recommendations for Other Services       Precautions / Restrictions Precautions Precautions: None Restrictions Weight Bearing Restrictions: No    Mobility  Bed Mobility Overal bed mobility: (pt received sitting in recliner)                Transfers Overall transfer level: Independent Equipment used: None   Sit to Stand: Independent            Ambulation/Gait Ambulation/Gait assistance: Independent Gait Distance (Feet): 600 Feet Assistive device: None Gait Pattern/deviations: Step-through pattern Gait velocity: functional Gait velocity interpretation: >4.37 ft/sec, indicative of normal walking speed General Gait Details: steady step through gait, pt able to perform head turns, change gait speed, step over obstables, and perform sharp changes of direction without assistance or LOB   Stairs             Wheelchair Mobility    Modified Rankin (Stroke Patients Only) Modified Rankin (Stroke Patients Only) Pre-Morbid Rankin Score: No symptoms Modified Rankin: No significant disability     Balance  Overall balance assessment: Independent                                          Cognition Arousal/Alertness: Awake/alert Behavior During Therapy: WFL for tasks assessed/performed Overall Cognitive Status: Within Functional Limits for tasks assessed                                        Exercises      General Comments General comments (skin integrity, edema, etc.): VSS      Pertinent Vitals/Pain Pain Assessment: No/denies pain    Home Living                      Prior Function            PT Goals (current goals can now be found in the care plan section) Acute Rehab PT Goals Patient Stated Goal: To return to independent mobility Progress towards PT goals: Goals met/education completed, patient discharged from PT    Frequency           PT Plan Discharge plan needs to be updated    Co-evaluation              AM-PAC PT "6 Clicks" Mobility   Outcome Measure  Help needed turning from your back to your side while in a flat  bed without using bedrails?: None Help needed moving from lying on your back to sitting on the side of a flat bed without using bedrails?: None Help needed moving to and from a bed to a chair (including a wheelchair)?: None Help needed standing up from a chair using your arms (e.g., wheelchair or bedside chair)?: None Help needed to walk in hospital room?: None Help needed climbing 3-5 steps with a railing? : None 6 Click Score: 24    End of Session Equipment Utilized During Treatment: (none) Activity Tolerance: Patient tolerated treatment well Patient left: in chair;with call bell/phone within reach Nurse Communication: Mobility status       Time: 1010-1019 PT Time Calculation (min) (ACUTE ONLY): 9 min  Charges:  $Gait Training: 8-22 mins                     Zenaida Niece, PT, DPT Acute Rehabilitation Pager: [place] 03/07/2019, 10:46 AM

## 2019-03-07 NOTE — Consult Note (Addendum)
  ELECTROPHYSIOLOGY CONSULT NOTE  Patient ID: Renee Rodriguez MRN: [id number], DOB/AGE: 04/13/1950   Admit date: 03/05/2019 Date of Consult: 03/07/2019  Primary Physician: Patient, No Pcp Per Primary Cardiologist: new to [facility] Reason for Consultation: Cryptogenic stroke; recommendations regarding Implantable Loop Recorder  History of Present Illness EP has been asked to evaluate Renee Rodriguez for placement of an implantable loop recorder to monitor for atrial fibrillation by Dr Sethi.  The patient was admitted on 03/05/2019 with sudden onset left hemiplegia.  Imaging demonstrated left basal ganglia and corona radiata infarct felt to be embolic 2/2 unknown source.  She received tPA this admission. .  she has undergone workup for stroke including echocardiogram and carotid imaging.  The patient has been monitored on telemetry which has demonstrated sinus rhythm with no arrhythmias.  Inpatient stroke work-up is to be completed with a TEE.   Echocardiogram this admission demonstrated normal LVEF, LA 29.  Lab work is reviewed.  Prior to admission, the patient denies chest pain, shortness of breath, dizziness, palpitations, or syncope.  They are recovering from their stroke with plans to return home at discharge.    Past Medical History:  Diagnosis Date  . Anemia   . Arthritis   . GERD (gastroesophageal reflux disease)   . Hypertension      Surgical History:  Past Surgical History:  Procedure Laterality Date  . CESAREAN SECTION  08/1982  . 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   Dr Lucey  . 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   Procedure: TOTAL KNEE ARTHROPLASTY;  Surgeon: Steve Lucey, MD;  Location: [facility] OR;  Service: Orthopedics;  Laterality: Left;  . TUBAL LIGATION  08/1982,01/1985     Medications Prior to Admission  Medication Sig Dispense Refill Last Dose  . acetaminophen (TYLENOL) 500 MG tablet Take 1,000 mg by mouth every 6 (six) hours as needed for mild pain.   Past Month  at Unknown time    Inpatient Medications:  .  stroke: mapping our early stages of recovery book   Does not apply Once  . aspirin EC  81 mg Oral Daily  . atorvastatin  80 mg Oral Daily  . Chlorhexidine Gluconate Cloth  6 each Topical Daily  . clopidogrel  75 mg Oral Daily  . hydrochlorothiazide  25 mg Oral Daily  . lisinopril  10 mg Oral Daily    Allergies:  Allergies  Allergen Reactions  . Shellfish Allergy Anaphylaxis    Social History   Socioeconomic History  . Marital status: Legally Separated    Spouse name: Not on file  . Number of children: Not on file  . Years of education: Not on file  . Highest education level: Not on file  Occupational History  . 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 Smoking status: Current Some Day Smoker    Types: Cigarettes    Last attempt to quit: 02/29/1996    Years since quitting: 23.0  . Smokeless tobacco: Never Used  Substance and Sexual Activity  . Alcohol use: Yes    Alcohol/week: 3.0 standard drinks    Types: 3 Standard drinks or equivalent per week    Comment: occasionally beer  . Drug use: No  . Sexual activity: Not on file  Other Topics Concern  . Not on file  Social History Narrative  . Not on file   Social Determinants of Health   Financial Resource Strain:   . Difficulty of Paying Living Expenses: Not on file  Food Insecurity:   .   Worried About Running Out of Food in the Last Year: Not on file  . Ran Out of Food in the Last Year: Not on file  Transportation Needs:   . Lack of Transportation (Medical): Not on file  . Lack of Transportation (Non-Medical): Not on file  Physical Activity:   . Days of Exercise per Week: Not on file  . Minutes of Exercise per Session: Not on file  Stress:   . Feeling of Stress : Not on file  Social Connections:   . Frequency of Communication with Friends and Family: Not on file  . Frequency of Social Gatherings with Friends and Family: Not on file  . Attends Religious  Services: Not on file  . Active Member of Clubs or Organizations: Not on file  . Attends Club or Organization Meetings: Not on file  . Marital Status: Not on file  Intimate Partner Violence:   . Fear of Current or Ex-Partner: Not on file  . Emotionally Abused: Not on file  . Physically Abused: Not on file  . Sexually Abused: Not on file     Family History  Problem Relation Age of Onset  . Stroke Mother        Cerebral hemorrhage  . Asthma Mother   . Alcohol abuse Mother   . Kidney disease Mother   . Hypertension Mother   . Cirrhosis Mother   . Hypertension Sister   . Colon cancer Neg Hx       Review of Systems: All other systems reviewed and are otherwise negative except as noted above.  Physical Exam: Vitals:   03/07/19 0500 03/07/19 0600 03/07/19 0700 03/07/19 0747  BP: 133/88 (!) 148/99 (!) 148/98 (!) 170/100  Pulse:   70   Resp: 19 20 (!) 22 17  Temp:    98.2 F (36.8 C)  TempSrc:    Oral  SpO2:   99%   Weight:      Height:        GEN- The patient is well appearing, alert and oriented x 3 today.   Head- normocephalic, atraumatic Eyes-  Sclera clear, conjunctiva pink Ears- hearing intact Oropharynx- clear Neck- supple Lungs- Clear to ausculation bilaterally, normal work of breathing Heart- Regular rate and rhythm  GI- soft, NT, ND, + BS Extremities- no clubbing, cyanosis, or edema MS- no significant deformity or atrophy Skin- no rash or lesion Psych- euthymic mood, full affect   Labs:   Lab Results  Component Value Date   WBC 7.2 03/05/2019   HGB 14.7 03/05/2019   HCT 45.0 03/05/2019   MCV 91.6 03/05/2019   PLT 385 03/05/2019    Recent Labs  Lab 03/05/19 0814 03/05/19 0824  NA 138 139  K 4.0 3.8  CL 103 105  CO2 20*  --   BUN 9 11  CREATININE 0.63 0.60  CALCIUM 9.3  --   PROT 7.4  --   BILITOT 1.1  --   ALKPHOS 99  --   ALT 28  --   AST 43*  --   GLUCOSE 105* 105*     Radiology/Studies: CT ANGIO HEAD W OR WO CONTRAST  Result  Date: 03/05/2019 CLINICAL DATA:  Right-sided deficit. EXAM: CT ANGIOGRAPHY HEAD AND NECK TECHNIQUE: Multidetector CT imaging of the head and neck was performed using the standard protocol during bolus administration of intravenous contrast. Multiplanar CT image reconstructions and MIPs were obtained to evaluate the vascular anatomy. Carotid stenosis measurements (when applicable) are obtained utilizing NASCET criteria,   using the distal internal carotid diameter as the denominator. CONTRAST:  75mL OMNIPAQUE IOHEXOL 350 MG/ML SOLN COMPARISON:  CT head without contrast 03/05/2019. FINDINGS: CTA NECK FINDINGS Aortic arch: A 3 vessel arch configuration is present. No significant atherosclerotic change, aneurysm, or stenosis is present. Right carotid system: The right common carotid artery is within normal limits. Noncalcified plaque is present at the carotid bifurcation and proximal right internal carotid artery. Minimal calcifications are present as well. There is no significant stenosis relative to the more distal vessel. The cervical right ICA is otherwise normal. There is slight vessel irregularity in the mid cervical right ICA without significant stenosis. Left carotid system: The left common carotid artery is within normal limits. Minimal atherosclerotic changes present at the proximal left ICA without significant stenosis. There is mild irregularity of the vessel in the mid cervical segment without significant stenosis. Focal calcification is present in the wall the distal left ICA, just below the skull base without significant stenosis. Vertebral arteries: The left vertebral artery is the dominant vessel. It originates from the subclavian artery without significant stenosis. There is no significant stenosis in the left vertebral artery in the neck. The right vertebral artery is occluded. There is no significant reconstitution of the vessel in the neck. Skeleton: Mild endplate degenerative changes are present lower  cervical spine. There straightening of the normal cervical lordosis. No focal lytic or blastic lesions are present. Other neck: The soft tissues the neck are otherwise unremarkable. Salivary glands are within normal limits. No focal mucosal lesions are present. The thyroid is somewhat heterogeneous, but not enlarged. There is no dominant lesion. No followup recommended (ref: J Am Coll Radiol. 2015 Feb;12(2): 143-50). No significant adenopathy is present. Upper chest: The lung apices are clear. Thoracic inlet is within normal limits. Review of the MIP images confirms the above findings CTA HEAD FINDINGS Anterior circulation: Atherosclerotic calcifications are present within the cavernous internal carotid arteries bilaterally. There is no significant stenosis relative to the more distal vessels. ICA termini are within normal limits bilaterally. The A1 and M1 segments are within normal limits. The anterior communicating artery is patent. MCA bifurcations are intact. There is mild distal small vessel irregularity within the ACA and MCA divisions. No significant proximal stenosis or occlusion is present. There is no aneurysm. Posterior circulation: The hypoplastic distal right V4 segment is opacified, likely filling retrograde. Left vertebral artery is within normal limits. PICA origin is visualized and normal. The basilar artery is small. Both posterior cerebral arteries are of fetal type. Small P1 segments contribute, more prominent right than left. Distal segmental narrowing is present within PCA branch vessels without a significant proximal stenosis or occlusion. Venous sinuses: Dural sinuses are patent. The straight sinus deep cerebral veins are intact. Cortical veins are unremarkable. Anatomic variants: Fetal type posterior cerebral arteries bilaterally. Review of the MIP images confirms the above findings IMPRESSION: 1. Occlusion of the right vertebral artery at its origin without significant reconstitution in the  neck or V3 segment. 2. A hypoplastic distal right V4 segment is opacified, likely filling retrograde. 3. Mild atherosclerotic changes at the carotid bifurcations bilaterally without significant stenosis. 4. Mild distal small vessel disease without a significant proximal stenosis, aneurysm, or branch vessel occlusion within the Circle of Willis. 5. Degenerative changes within the cervical spine. Electronically Signed   By: Christopher  Mattern M.D.   On: 03/05/2019 08:55   CT HEAD WO CONTRAST  Result Date: 03/06/2019 CLINICAL DATA:  Stroke, follow-up post tPA EXAM: CT HEAD   WITHOUT CONTRAST TECHNIQUE: Contiguous axial images were obtained from the base of the skull through the vertex without intravenous contrast. COMPARISON:  03/05/2019 FINDINGS: Brain: There is no acute intracranial hemorrhage. Ill-defined low attenuation is present along the acute infarction seen on prior MRI involving left basal ganglia and corona radiata. Chronic small vessel infarct of the right thalamus is again noted. Additional patchy hypoattenuation in the supratentorial white matter likely reflects stable mild chronic microvascular ischemic changes. There is no significant mass effect, hydrocephalus, or extra-axial collection. Vascular: There is atherosclerotic calcification at the skull base. Skull: Calvarium is unremarkable. Sinuses/Orbits: No acute finding. Other: None. IMPRESSION: No acute intracranial hemorrhage. Evolving acute infarction of the left basal ganglia and corona radiata. Stable additional chronic findings detailed above. Electronically Signed   By: Praneil  Patel M.D.   On: 03/06/2019 11:46   CT ANGIO NECK W OR WO CONTRAST  Result Date: 03/05/2019 CLINICAL DATA:  Right-sided deficit. EXAM: CT ANGIOGRAPHY HEAD AND NECK TECHNIQUE: Multidetector CT imaging of the head and neck was performed using the standard protocol during bolus administration of intravenous contrast. Multiplanar CT image reconstructions and MIPs were  obtained to evaluate the vascular anatomy. Carotid stenosis measurements (when applicable) are obtained utilizing NASCET criteria, using the distal internal carotid diameter as the denominator. CONTRAST:  75mL OMNIPAQUE IOHEXOL 350 MG/ML SOLN COMPARISON:  CT head without contrast 03/05/2019. FINDINGS: CTA NECK FINDINGS Aortic arch: A 3 vessel arch configuration is present. No significant atherosclerotic change, aneurysm, or stenosis is present. Right carotid system: The right common carotid artery is within normal limits. Noncalcified plaque is present at the carotid bifurcation and proximal right internal carotid artery. Minimal calcifications are present as well. There is no significant stenosis relative to the more distal vessel. The cervical right ICA is otherwise normal. There is slight vessel irregularity in the mid cervical right ICA without significant stenosis. Left carotid system: The left common carotid artery is within normal limits. Minimal atherosclerotic changes present at the proximal left ICA without significant stenosis. There is mild irregularity of the vessel in the mid cervical segment without significant stenosis. Focal calcification is present in the wall the distal left ICA, just below the skull base without significant stenosis. Vertebral arteries: The left vertebral artery is the dominant vessel. It originates from the subclavian artery without significant stenosis. There is no significant stenosis in the left vertebral artery in the neck. The right vertebral artery is occluded. There is no significant reconstitution of the vessel in the neck. Skeleton: Mild endplate degenerative changes are present lower cervical spine. There straightening of the normal cervical lordosis. No focal lytic or blastic lesions are present. Other neck: The soft tissues the neck are otherwise unremarkable. Salivary glands are within normal limits. No focal mucosal lesions are present. The thyroid is somewhat  heterogeneous, but not enlarged. There is no dominant lesion. No followup recommended (ref: J Am Coll Radiol. 2015 Feb;12(2): 143-50). No significant adenopathy is present. Upper chest: The lung apices are clear. Thoracic inlet is within normal limits. Review of the MIP images confirms the above findings CTA HEAD FINDINGS Anterior circulation: Atherosclerotic calcifications are present within the cavernous internal carotid arteries bilaterally. There is no significant stenosis relative to the more distal vessels. ICA termini are within normal limits bilaterally. The A1 and M1 segments are within normal limits. The anterior communicating artery is patent. MCA bifurcations are intact. There is mild distal small vessel irregularity within the ACA and MCA divisions. No significant proximal stenosis or occlusion   is present. There is no aneurysm. Posterior circulation: The hypoplastic distal right V4 segment is opacified, likely filling retrograde. Left vertebral artery is within normal limits. PICA origin is visualized and normal. The basilar artery is small. Both posterior cerebral arteries are of fetal type. Small P1 segments contribute, more prominent right than left. Distal segmental narrowing is present within PCA branch vessels without a significant proximal stenosis or occlusion. Venous sinuses: Dural sinuses are patent. The straight sinus deep cerebral veins are intact. Cortical veins are unremarkable. Anatomic variants: Fetal type posterior cerebral arteries bilaterally. Review of the MIP images confirms the above findings IMPRESSION: 1. Occlusion of the right vertebral artery at its origin without significant reconstitution in the neck or V3 segment. 2. A hypoplastic distal right V4 segment is opacified, likely filling retrograde. 3. Mild atherosclerotic changes at the carotid bifurcations bilaterally without significant stenosis. 4. Mild distal small vessel disease without a significant proximal stenosis,  aneurysm, or branch vessel occlusion within the Circle of Willis. 5. Degenerative changes within the cervical spine. Electronically Signed   By: Christopher  Mattern M.D.   On: 03/05/2019 08:55   MR BRAIN WO CONTRAST  Result Date: 03/06/2019 CLINICAL DATA:  68-year-old female code stroke presentation yesterday with right side deficit. 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 EXAM: MRI HEAD WITHOUT CONTRAST TECHNIQUE: Multiplanar, multiecho pulse sequences of the brain and surrounding structures were obtained without intravenous contrast. COMPARISON:  CTA head and neck and head CT yesterday. FINDINGS: Brain: Linear 12 millimeter restricted diffusion in the posterior left lentiform (series 3, image 17). Separate but nearby small 6 millimeter focus of restricted diffusion in the left posterior periventricular white matter, corona radiata (series 2, image 26). Faint T2 and FLAIR hyperintensity. No associated hemorrhage or mass effect. No other restricted diffusion. Chronic lacunar infarcts in the thalami. Scattered bilateral cerebral white matter T2 and FLAIR hyperintensity which in some and other areas resembles small chronic white matter lacunae (series 7, image 16). Similar patchy T2 and FLAIR hyperintensity in the pons. No cortical encephalomalacia identified. Possible chronic microhemorrhage in the dorsal right thalamus on series 11, image 12. No other chronic cerebral blood products. No midline shift, mass effect, evidence of mass lesion, ventriculomegaly, extra-axial collection or acute intracranial hemorrhage. Cervicomedullary junction and pituitary are within normal limits. Vascular: Major intracranial vascular flow voids are preserved aside from the distal right vertebral artery, where a diminutive V4 segment is faintly apparent. Skull and upper cervical spine: Negative visible cervical spine. Normal bone marrow signal. Sinuses/Orbits: Negative orbits. Paranasal sinuses are well pneumatized. Other:  Mastoids are clear. Grossly normal visible internal auditory structures. Scalp and face soft tissues appear negative. IMPRESSION: 1. Small acute lacunar infarcts in the posterior left lentiform nucleus and the posterior left corona radiata. No associated hemorrhage or mass effect. 2. Underlying chronic small vessel disease, with a prominent chronic lacunar infarct and microhemorrhage in the right thalamus. 3. Evidence of reconstituted diminutive right vertebral V4 and/or PICA in the setting of the cervical Right Vertebral Artery occlusion demonstrated yesterday. Electronically Signed   By: H  Hall M.D.   On: 03/06/2019 09:23   ECHOCARDIOGRAM COMPLETE  Result Date: 03/05/2019   ECHOCARDIOGRAM REPORT   Patient Name:   Gerilyn C Codrington Date of Exam: 03/05/2019 Medical Rec #:  [id number]       Height:       60.0 in Accession #:    [id number]      Weight:       127.0 lb Date   of Birth:  01/28/1951       BSA:          1.54 m Patient Age:    68 years        BP:           152/89 mmHg Patient Gender: F               HR:           78 bpm. Exam Location:  Inpatient Procedure: 2D Echo Indications:    Stroke 434.91/I163.9  History:        Patient has no prior history of Echocardiogram examinations.                 Risk Factors:Hypertension.  Sonographer:    Arthur Guy RDCS (AE) Referring Phys: [id number] DAVID R SMITH  Sonographer Comments: Patient moving constantly throughout exam. IMPRESSIONS  1. Left ventricular ejection fraction, by visual estimation, is 60 to 65%. The left ventricle has normal function. Left ventricular septal wall thickness was mildly increased. There is mildly increased left ventricular hypertrophy.  2. Left ventricular diastolic parameters are consistent with Grade I diastolic dysfunction (impaired relaxation).  3. Global right ventricle has normal systolic function.The right ventricular size is normal. No increase in right ventricular wall thickness.  4. Left atrial size was mildly dilated.  5. Right atrial  size was normal.  6. The mitral valve is normal in structure. No evidence of mitral valve regurgitation. No evidence of mitral stenosis.  7. The tricuspid valve is normal in structure.  8. The aortic valve is tricuspid. Aortic valve regurgitation is not visualized. No evidence of aortic valve sclerosis or stenosis.  9. The pulmonic valve was normal in structure. Pulmonic valve regurgitation is not visualized. 10. The inferior vena cava is normal in size with greater than 50% respiratory variability, suggesting right atrial pressure of 3 mmHg. 11. No intracardiac source of emboli noted. FINDINGS  Left Ventricle: Left ventricular ejection fraction, by visual estimation, is 60 to 65%. The left ventricle has normal function. The left ventricle has no regional wall motion abnormalities. There is mildly increased left ventricular hypertrophy. Left ventricular diastolic parameters are consistent with Grade I diastolic dysfunction (impaired relaxation). Normal left atrial pressure. Right Ventricle: The right ventricular size is normal. No increase in right ventricular wall thickness. Global RV systolic function is has normal systolic function. Left Atrium: Left atrial size was mildly dilated. Right Atrium: Right atrial size was normal in size Pericardium: There is no evidence of pericardial effusion. Mitral Valve: The mitral valve is normal in structure. No evidence of mitral valve regurgitation. No evidence of mitral valve stenosis by observation. MV peak gradient, 3.9 mmHg. Tricuspid Valve: The tricuspid valve is normal in structure. Tricuspid valve regurgitation is trivial. Aortic Valve: The aortic valve is tricuspid. Aortic valve regurgitation is not visualized. The aortic valve is structurally normal, with no evidence of sclerosis or stenosis. Pulmonic Valve: The pulmonic valve was normal in structure. Pulmonic valve regurgitation is not visualized. Pulmonic regurgitation is not visualized. Aorta: The aortic root and  ascending aorta are structurally normal, with no evidence of dilitation. Venous: The inferior vena cava is normal in size with greater than 50% respiratory variability, suggesting right atrial pressure of 3 mmHg. IAS/Shunts: No atrial level shunt detected by color flow Doppler. There is no evidence of a patent foramen ovale. No ventricular septal defect is seen or detected. There is no evidence of an atrial septal defect.  LEFT VENTRICLE PLAX   2D LVIDd:         3.57 cm  Diastology LVIDs:         1.92 cm  LV e' lateral:   6.85 cm/s LV PW:         1.14 cm  LV E/e' lateral: 7.8 LV IVS:        1.30 cm  LV e' medial:    5.00 cm/s LVOT diam:     1.80 cm  LV E/e' medial:  10.7 LV SV:         42 ml LV SV Index:   26.60 LVOT Area:     2.54 cm  RIGHT VENTRICLE             IVC RV Basal diam:  3.44 cm     IVC diam: 1.59 cm RV S prime:     10.10 cm/s TAPSE (M-mode): 2.3 cm LEFT ATRIUM             Index       RIGHT ATRIUM           Index LA diam:        2.90 cm 1.88 cm/m  RA Area:     14.20 cm LA Vol (A2C):   53.7 ml 34.89 ml/m RA Volume:   36.70 ml  23.85 ml/m LA Vol (A4C):   48.9 ml 31.77 ml/m LA Biplane Vol: 52.6 ml 34.18 ml/m  AORTIC VALVE LVOT Vmax:   125.00 cm/s LVOT Vmean:  78.400 cm/s LVOT VTI:    0.225 m  AORTA Ao Root diam: 3.00 cm Ao Asc diam:  3.30 cm MITRAL VALVE MV Area (PHT): 2.48 cm             SHUNTS MV Peak grad:  3.9 mmHg             Systemic VTI:  0.22 m MV Mean grad:  1.0 mmHg             Systemic Diam: 1.80 cm MV Vmax:       0.99 m/s MV Vmean:      53.4 cm/s MV VTI:        0.21 m MV PHT:        88.74 msec MV Decel Time: 306 msec MV E velocity: 53.60 cm/s 103 cm/s MV A velocity: 96.80 cm/s 70.3 cm/s MV E/A ratio:  0.55       1.5  Gayatri Acharya MD Electronically signed by Gayatri Acharya MD Signature Date/Time: 03/05/2019/10:24:50 PM    Final    CT HEAD CODE STROKE WO CONTRAST  Result Date: 03/05/2019 CLINICAL DATA:  Code stroke.  Right-sided deficit EXAM: CT HEAD WITHOUT CONTRAST TECHNIQUE:  Contiguous axial images were obtained from the base of the skull through the vertex without intravenous contrast. COMPARISON:  None. FINDINGS: Brain: Ventricle size normal. Hypodensity right thalamus compatible with infarct, probably chronic. Small white matter hypodensities compatible with ischemia, likely chronic. Small chronic infarct left caudate. Negative for acute cortical infarct. Negative for hemorrhage or mass. Vascular: Negative for hyperdense vessel Skull: Negative Sinuses/Orbits: Mild mucosal edema paranasal sinuses. Negative orbit. Other: None ASPECTS ([place] Stroke Program Early CT Score) - Ganglionic level infarction (caudate, lentiform nuclei, internal capsule, insula, M1-M3 cortex): 7 - Supraganglionic infarction (M4-M6 cortex): 3 Total score (0-10 with 10 being normal): 10 IMPRESSION: 1. No acute intracranial abnormality. 2. ASPECTS is 10 3. Mild white matter ischemia. Right thalamic infarct appears chronic. No prior studies for comparison. 4. These results were called by telephone   at the time of interpretation on 03/05/2019 at 8:30 am to provider Aroor , who verbally acknowledged these results. Electronically Signed   By: Charles  Clark M.D.   On: 03/05/2019 08:31    12-lead ECG SR (personally reviewed) All prior EKG's in EPIC reviewed with no documented atrial fibrillation  Telemetry SR (personally reviewed)  Assessment and Plan:  1. Cryptogenic stroke The patient presents with cryptogenic stroke.  The patient has a TEE planned for this AM.  I spoke at length with the patient about monitoring for afib with an implantable loop recorder.  Risks, benefits, and alteratives to implantable loop recorder were discussed with the patient today.   At this time, the patient is very clear in their decision to proceed with implantable loop recorder.   Wound care was reviewed with the patient (keep incision clean and dry for 3 days).  Wound check scheduled and entered in AVS.  Please call with  questions.   Amber Seiler, NP 03/07/2019 9:11 AM   I have seen, examined the patient, and reviewed the above assessment and plan.  Changes to above are made where necessary.  On exam, RRR.   The patient presents with cryptogenic stroke.  The patient has a TEE planned for this AM.  I spoke at length with the patient about monitoring for afib with an implantable loop recorder.  Risks, benefits, and alteratives to implantable loop recorder were discussed with the patient today.   At this time, the patient is very clear in their decision to proceed with implantable loop recorder.   Please call with questions.    Co Sign: Tibor Lemmons, MD 03/07/2019 11:00 AM   

## 2019-03-07 NOTE — Discharge Summary (Addendum)
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   aspirin 81 MG EC tablet Take 1 tablet (81 mg total) by mouth daily. Start taking on: March 08, 2019   atorvastatin 80 MG tablet Commonly known as: LIPITOR Take 1 tablet (80 mg total) by mouth daily. Start taking on: March 08, 2019 What changed:   medication strength  how much to take   clopidogrel 75 MG tablet Commonly known as: PLAVIX Take 1 tablet (75 mg total) by mouth daily for 21 doses. Start taking on: March 08, 2019   hydrochlorothiazide 25 MG tablet Commonly known as: HYDRODIURIL Take 1 tablet (25 mg total) by mouth daily. Start taking on: March 08, 2019   lisinopril 20 MG tablet Commonly known as: ZESTRIL Take 1 tablet (20 mg total) by mouth daily. Start taking on: March 08, 2019       LABORATORY STUDIES CBC    Component Value Date/Time   WBC 7.2 03/05/2019 0832   RBC 4.91 03/05/2019 0832   HGB 14.7 03/05/2019 0832   HCT 45.0 03/05/2019 0832   PLT 385 03/05/2019 0832   MCV 91.6 03/05/2019 0832   MCH 29.9 03/05/2019 0832   MCHC 32.7 03/05/2019 0832   RDW 13.4 03/05/2019 0832   LYMPHSABS 2.5 03/05/2019 0832   MONOABS 0.6 03/05/2019 0832   EOSABS 0.1 03/05/2019 0832    BASOSABS 0.0 03/05/2019 0832   CMP    Component Value Date/Time   NA 139 03/05/2019 0824   NA 140 09/25/2017 1649   K 3.8 03/05/2019 0824   CL 105 03/05/2019 0824   CO2 20 (L) 03/05/2019 0814   GLUCOSE 105 (H) 03/05/2019 0824   BUN 11 03/05/2019 0824   BUN 13 09/25/2017 1649   CREATININE 0.60 03/05/2019 0824   CREATININE 0.49 (L) 12/31/2014 1504   CALCIUM 9.3 03/05/2019 0814   PROT 7.4 03/05/2019 0814   PROT 7.4 09/25/2017 1649   ALBUMIN 3.8 03/05/2019 0814   ALBUMIN 4.4 09/25/2017 1649   AST 43 (H) 03/05/2019 0814   ALT 28 03/05/2019 0814   ALKPHOS 99 03/05/2019 0814   BILITOT 1.1 03/05/2019 0814   BILITOT 0.7 09/25/2017 1649   GFRNONAA >60 03/05/2019 0814   GFRAA >60 03/05/2019 0814   COAGS Lab Results  Component Value Date   INR 1.0 03/05/2019   INR 0.99 08/08/2012   Lipid Panel    Component Value Date/Time   CHOL 288 (H) 03/06/2019 0635   CHOL 269 (H) 09/25/2017 1649   TRIG 109 03/06/2019 0635   HDL 66 03/06/2019 0635   HDL 69 09/25/2017 1649   CHOLHDL 4.4 03/06/2019 0635   VLDL 22 03/06/2019 [id number]  LDLCALC 200 (H) 03/06/2019 0635   LDLCALC 165 (H) 09/25/2017 1649   HgbA1C  Lab Results  Component Value Date   HGBA1C 5.4 03/06/2019   Urine Drug Screen     Component Value Date/Time   LABOPIA NONE DETECTED 03/06/2019 1023   COCAINSCRNUR NONE DETECTED 03/06/2019 1023   LABBENZ NONE DETECTED 03/06/2019 1023   AMPHETMU NONE DETECTED 03/06/2019 1023   THCU POSITIVE (A) 03/06/2019 1023   LABBARB NONE DETECTED 03/06/2019 1023     SIGNIFICANT DIAGNOSTIC STUDIES CT ANGIO HEAD W OR WO CONTRAST  Result Date: 03/05/2019 CLINICAL DATA:  Right-sided deficit. EXAM: CT ANGIOGRAPHY HEAD AND NECK TECHNIQUE: Multidetector CT imaging of the head and neck was performed using the standard protocol during bolus administration of intravenous contrast. Multiplanar CT image reconstructions and MIPs were obtained to evaluate the vascular anatomy. Carotid stenosis  measurements (when applicable) are obtained utilizing NASCET criteria, using the distal internal carotid diameter as the denominator. CONTRAST:  75mL OMNIPAQUE IOHEXOL 350 MG/ML SOLN COMPARISON:  CT head without contrast 03/05/2019. FINDINGS: CTA NECK FINDINGS Aortic arch: A 3 vessel arch configuration is present. No significant atherosclerotic change, aneurysm, or stenosis is present. Right carotid system: The right common carotid artery is within normal limits. Noncalcified plaque is present at the carotid bifurcation and proximal right internal carotid artery. Minimal calcifications are present as well. There is no significant stenosis relative to the more distal vessel. The cervical right ICA is otherwise normal. There is slight vessel irregularity in the mid cervical right ICA without significant stenosis. Left carotid system: The left common carotid artery is within normal limits. Minimal atherosclerotic changes present at the proximal left ICA without significant stenosis. There is mild irregularity of the vessel in the mid cervical segment without significant stenosis. Focal calcification is present in the wall the distal left ICA, just below the skull base without significant stenosis. Vertebral arteries: The left vertebral artery is the dominant vessel. It originates from the subclavian artery without significant stenosis. There is no significant stenosis in the left vertebral artery in the neck. The right vertebral artery is occluded. There is no significant reconstitution of the vessel in the neck. Skeleton: Mild endplate degenerative changes are present lower cervical spine. There straightening of the normal cervical lordosis. No focal lytic or blastic lesions are present. Other neck: The soft tissues the neck are otherwise unremarkable. Salivary glands are within normal limits. No focal mucosal lesions are present. The thyroid is somewhat heterogeneous, but not enlarged. There is no dominant lesion. No  followup recommended (ref: J Am Coll Radiol. 2015 Feb;12(2): 143-50). No significant adenopathy is present. Upper chest: The lung apices are clear. Thoracic inlet is within normal limits. Review of the MIP images confirms the above findings CTA HEAD FINDINGS Anterior circulation: Atherosclerotic calcifications are present within the cavernous internal carotid arteries bilaterally. There is no significant stenosis relative to the more distal vessels. ICA termini are within normal limits bilaterally. The A1 and M1 segments are within normal limits. The anterior communicating artery is patent. MCA bifurcations are intact. There is mild distal small vessel irregularity within the ACA and MCA divisions. No significant proximal stenosis or occlusion is present. There is no aneurysm. Posterior circulation: The hypoplastic distal right V4 segment is opacified, likely filling retrograde. Left vertebral artery is within normal limits. PICA origin is visualized and normal. The basilar artery is small. Both posterior cerebral arteries are of fetal type. Small P1 segments contribute, more prominent right than left. Distal segmental narrowing  is present within PCA branch vessels without a significant proximal stenosis or occlusion. Venous sinuses: Dural sinuses are patent. The straight sinus deep cerebral veins are intact. Cortical veins are unremarkable. Anatomic variants: Fetal type posterior cerebral arteries bilaterally. Review of the MIP images confirms the above findings IMPRESSION: 1. Occlusion of the right vertebral artery at its origin without significant reconstitution in the neck or V3 segment. 2. A hypoplastic distal right V4 segment is opacified, likely filling retrograde. 3. Mild atherosclerotic changes at the carotid bifurcations bilaterally without significant stenosis. 4. Mild distal small vessel disease without a significant proximal stenosis, aneurysm, or branch vessel occlusion within the Circle of Willis. 5.  Degenerative changes within the cervical spine. Electronically Signed   By: Marin Roberts M.D.   On: 03/05/2019 08:55   CT HEAD WO CONTRAST  Result Date: 03/06/2019 CLINICAL DATA:  Stroke, follow-up post tPA EXAM: CT HEAD WITHOUT CONTRAST TECHNIQUE: Contiguous axial images were obtained from the base of the skull through the vertex without intravenous contrast. COMPARISON:  03/05/2019 FINDINGS: Brain: There is no acute intracranial hemorrhage. Ill-defined low attenuation is present along the acute infarction seen on prior MRI involving left basal ganglia and corona radiata. Chronic small vessel infarct of the right thalamus is again noted. Additional patchy hypoattenuation in the supratentorial white matter likely reflects stable mild chronic microvascular ischemic changes. There is no significant mass effect, hydrocephalus, or extra-axial collection. Vascular: There is atherosclerotic calcification at the skull base. Skull: Calvarium is unremarkable. Sinuses/Orbits: No acute finding. Other: None. IMPRESSION: No acute intracranial hemorrhage. Evolving acute infarction of the left basal ganglia and corona radiata. Stable additional chronic findings detailed above. Electronically Signed   By: Guadlupe Spanish M.D.   On: 03/06/2019 11:46   CT ANGIO NECK W OR WO CONTRAST  Result Date: 03/05/2019 CLINICAL DATA:  Right-sided deficit. EXAM: CT ANGIOGRAPHY HEAD AND NECK TECHNIQUE: Multidetector CT imaging of the head and neck was performed using the standard protocol during bolus administration of intravenous contrast. Multiplanar CT image reconstructions and MIPs were obtained to evaluate the vascular anatomy. Carotid stenosis measurements (when applicable) are obtained utilizing NASCET criteria, using the distal internal carotid diameter as the denominator. CONTRAST:  70mL OMNIPAQUE IOHEXOL 350 MG/ML SOLN COMPARISON:  CT head without contrast 03/05/2019. FINDINGS: CTA NECK FINDINGS Aortic arch: A 3 vessel arch  configuration is present. No significant atherosclerotic change, aneurysm, or stenosis is present. Right carotid system: The right common carotid artery is within normal limits. Noncalcified plaque is present at the carotid bifurcation and proximal right internal carotid artery. Minimal calcifications are present as well. There is no significant stenosis relative to the more distal vessel. The cervical right ICA is otherwise normal. There is slight vessel irregularity in the mid cervical right ICA without significant stenosis. Left carotid system: The left common carotid artery is within normal limits. Minimal atherosclerotic changes present at the proximal left ICA without significant stenosis. There is mild irregularity of the vessel in the mid cervical segment without significant stenosis. Focal calcification is present in the wall the distal left ICA, just below the skull base without significant stenosis. Vertebral arteries: The left vertebral artery is the dominant vessel. It originates from the subclavian artery without significant stenosis. There is no significant stenosis in the left vertebral artery in the neck. The right vertebral artery is occluded. There is no significant reconstitution of the vessel in the neck. Skeleton: Mild endplate degenerative changes are present lower cervical spine. There straightening of the normal cervical  lordosis. No focal lytic or blastic lesions are present. Other neck: The soft tissues the neck are otherwise unremarkable. Salivary glands are within normal limits. No focal mucosal lesions are present. The thyroid is somewhat heterogeneous, but not enlarged. There is no dominant lesion. No followup recommended (ref: J Am Coll Radiol. 2015 Feb;12(2): 143-50). No significant adenopathy is present. Upper chest: The lung apices are clear. Thoracic inlet is within normal limits. Review of the MIP images confirms the above findings CTA HEAD FINDINGS Anterior circulation:  Atherosclerotic calcifications are present within the cavernous internal carotid arteries bilaterally. There is no significant stenosis relative to the more distal vessels. ICA termini are within normal limits bilaterally. The A1 and M1 segments are within normal limits. The anterior communicating artery is patent. MCA bifurcations are intact. There is mild distal small vessel irregularity within the ACA and MCA divisions. No significant proximal stenosis or occlusion is present. There is no aneurysm. Posterior circulation: The hypoplastic distal right V4 segment is opacified, likely filling retrograde. Left vertebral artery is within normal limits. PICA origin is visualized and normal. The basilar artery is small. Both posterior cerebral arteries are of fetal type. Small P1 segments contribute, more prominent right than left. Distal segmental narrowing is present within PCA branch vessels without a significant proximal stenosis or occlusion. Venous sinuses: Dural sinuses are patent. The straight sinus deep cerebral veins are intact. Cortical veins are unremarkable. Anatomic variants: Fetal type posterior cerebral arteries bilaterally. Review of the MIP images confirms the above findings IMPRESSION: 1. Occlusion of the right vertebral artery at its origin without significant reconstitution in the neck or V3 segment. 2. A hypoplastic distal right V4 segment is opacified, likely filling retrograde. 3. Mild atherosclerotic changes at the carotid bifurcations bilaterally without significant stenosis. 4. Mild distal small vessel disease without a significant proximal stenosis, aneurysm, or branch vessel occlusion within the Circle of Willis. 5. Degenerative changes within the cervical spine. Electronically Signed   By: San Morelle M.D.   On: 03/05/2019 08:55   MR BRAIN WO CONTRAST  Result Date: 03/06/2019 CLINICAL DATA:  70 year old female code stroke presentation yesterday with right side deficit. Cervical  right vertebral artery occlusion on CTA. EXAM: MRI HEAD WITHOUT CONTRAST TECHNIQUE: Multiplanar, multiecho pulse sequences of the brain and surrounding structures were obtained without intravenous contrast. COMPARISON:  CTA head and neck and head CT yesterday. FINDINGS: Brain: Linear 12 millimeter restricted diffusion in the posterior left lentiform (series 3, image 17). Separate but nearby small 6 millimeter focus of restricted diffusion in the left posterior periventricular white matter, corona radiata (series 2, image 26). Faint T2 and FLAIR hyperintensity. No associated hemorrhage or mass effect. No other restricted diffusion. Chronic lacunar infarcts in the thalami. Scattered bilateral cerebral white matter T2 and FLAIR hyperintensity which in some and other areas resembles small chronic white matter lacunae (series 7, image 16). Similar patchy T2 and FLAIR hyperintensity in the pons. No cortical encephalomalacia identified. Possible chronic microhemorrhage in the dorsal right thalamus on series 11, image 12. No other chronic cerebral blood products. No midline shift, mass effect, evidence of mass lesion, ventriculomegaly, extra-axial collection or acute intracranial hemorrhage. Cervicomedullary junction and pituitary are within normal limits. Vascular: Major intracranial vascular flow voids are preserved aside from the distal right vertebral artery, where a diminutive V4 segment is faintly apparent. Skull and upper cervical spine: Negative visible cervical spine. Normal bone marrow signal. Sinuses/Orbits: Negative orbits. Paranasal sinuses are well pneumatized. Other: Mastoids are clear. Grossly normal visible  internal auditory structures. Scalp and face soft tissues appear negative. IMPRESSION: 1. Small acute lacunar infarcts in the posterior left lentiform nucleus and the posterior left corona radiata. No associated hemorrhage or mass effect. 2. Underlying chronic small vessel disease, with a prominent  chronic lacunar infarct and microhemorrhage in the right thalamus. 3. Evidence of reconstituted diminutive right vertebral V4 and/or PICA in the setting of the cervical Right Vertebral Artery occlusion demonstrated yesterday. Electronically Signed   By: Odessa Fleming M.D.   On: 03/06/2019 09:23   ECHOCARDIOGRAM COMPLETE  Result Date: 03/05/2019   ECHOCARDIOGRAM REPORT   Patient Name:   Renee Rodriguez Date of Exam: 03/05/2019 Medical Rec #:  [id number]       Height:       60.0 in Accession #:    [id number]      Weight:       127.0 lb Date of Birth:  08-26-1950       BSA:          1.54 m Patient Age:    68 years        BP:           152/89 mmHg Patient Gender: F               HR:           78 bpm. Exam Location:  Inpatient Procedure: 2D Echo Indications:    Stroke 434.91/I163.9  History:        Patient has no prior history of Echocardiogram examinations.                 Risk Factors:Hypertension.  Sonographer:    Ross Ludwig RDCS (AE) Referring Phys: [phone] DAVID R SMITH  Sonographer Comments: Patient moving constantly throughout exam. IMPRESSIONS  1. Left ventricular ejection fraction, by visual estimation, is 60 to 65%. The left ventricle has normal function. Left ventricular septal wall thickness was mildly increased. There is mildly increased left ventricular hypertrophy.  2. Left ventricular diastolic parameters are consistent with Grade I diastolic dysfunction (impaired relaxation).  3. Global right ventricle has normal systolic function.The right ventricular size is normal. No increase in right ventricular wall thickness.  4. Left atrial size was mildly dilated.  5. Right atrial size was normal.  6. The mitral valve is normal in structure. No evidence of mitral valve regurgitation. No evidence of mitral stenosis.  7. The tricuspid valve is normal in structure.  8. The aortic valve is tricuspid. Aortic valve regurgitation is not visualized. No evidence of aortic valve sclerosis or stenosis.  9. The pulmonic valve was  normal in structure. Pulmonic valve regurgitation is not visualized. 10. The inferior vena cava is normal in size with greater than 50% respiratory variability, suggesting right atrial pressure of 3 mmHg. 11. No intracardiac source of emboli noted. FINDINGS  Left Ventricle: Left ventricular ejection fraction, by visual estimation, is 60 to 65%. The left ventricle has normal function. The left ventricle has no regional wall motion abnormalities. There is mildly increased left ventricular hypertrophy. Left ventricular diastolic parameters are consistent with Grade I diastolic dysfunction (impaired relaxation). Normal left atrial pressure. Right Ventricle: The right ventricular size is normal. No increase in right ventricular wall thickness. Global RV systolic function is has normal systolic function. Left Atrium: Left atrial size was mildly dilated. Right Atrium: Right atrial size was normal in size Pericardium: There is no evidence of pericardial effusion. Mitral Valve: The mitral valve is normal in structure. No evidence  of mitral valve regurgitation. No evidence of mitral valve stenosis by observation. MV peak gradient, 3.9 mmHg. Tricuspid Valve: The tricuspid valve is normal in structure. Tricuspid valve regurgitation is trivial. Aortic Valve: The aortic valve is tricuspid. Aortic valve regurgitation is not visualized. The aortic valve is structurally normal, with no evidence of sclerosis or stenosis. Pulmonic Valve: The pulmonic valve was normal in structure. Pulmonic valve regurgitation is not visualized. Pulmonic regurgitation is not visualized. Aorta: The aortic root and ascending aorta are structurally normal, with no evidence of dilitation. Venous: The inferior vena cava is normal in size with greater than 50% respiratory variability, suggesting right atrial pressure of 3 mmHg. IAS/Shunts: No atrial level shunt detected by color flow Doppler. There is no evidence of a patent foramen ovale. No ventricular  septal defect is seen or detected. There is no evidence of an atrial septal defect.  LEFT VENTRICLE PLAX 2D LVIDd:         3.57 cm  Diastology LVIDs:         1.92 cm  LV e' lateral:   6.85 cm/s LV PW:         1.14 cm  LV E/e' lateral: 7.8 LV IVS:        1.30 cm  LV e' medial:    5.00 cm/s LVOT diam:     1.80 cm  LV E/e' medial:  10.7 LV SV:         42 ml LV SV Index:   26.60 LVOT Area:     2.54 cm  RIGHT VENTRICLE             IVC RV Basal diam:  3.44 cm     IVC diam: 1.59 cm RV S prime:     10.10 cm/s TAPSE (M-mode): 2.3 cm LEFT ATRIUM             Index       RIGHT ATRIUM           Index LA diam:        2.90 cm 1.88 cm/m  RA Area:     14.20 cm LA Vol (A2C):   53.7 ml 34.89 ml/m RA Volume:   36.70 ml  23.85 ml/m LA Vol (A4C):   48.9 ml 31.77 ml/m LA Biplane Vol: 52.6 ml 34.18 ml/m  AORTIC VALVE LVOT Vmax:   125.00 cm/s LVOT Vmean:  78.400 cm/s LVOT VTI:    0.225 m  AORTA Ao Root diam: 3.00 cm Ao Asc diam:  3.30 cm MITRAL VALVE MV Area (PHT): 2.48 cm             SHUNTS MV Peak grad:  3.9 mmHg             Systemic VTI:  0.22 m MV Mean grad:  1.0 mmHg             Systemic Diam: 1.80 cm MV Vmax:       0.99 m/s MV Vmean:      53.4 cm/s MV VTI:        0.21 m MV PHT:        88.74 msec MV Decel Time: 306 msec MV E velocity: 53.60 cm/s 103 cm/s MV A velocity: 96.80 cm/s 70.3 cm/s MV E/A ratio:  0.55       1.5  Weston Brass MD Electronically signed by Weston Brass MD Signature Date/Time: 03/05/2019/10:24:50 PM    Final    ECHO TEE  Result Date: 03/07/2019   TRANSESOPHOGEAL ECHO  REPORT   Patient Name:   Renee Rodriguez Date of Exam: 03/07/2019 Medical Rec #:  [id number]       Height:       60.0 in Accession #:    [id number]      Weight:       134.0 lb Date of Birth:  [birth year]       BSA:          1.57 m Patient Age:    19 years        BP:           107/65 mmHg Patient Gender: F               HR:           94 bpm. Exam Location:  Inpatient  Procedure: Transesophageal Echo and Color Doppler Indications:     Stroke  434.91/I163.9  History:         Patient has prior history of Echocardiogram examinations, most                  recent 03/05/2019. GERD.  Sonographer:     Leta Jungling RDCS Referring Phys:  [id number] Roe Ruther[place] [facility] Diagnosing Phys: Donato Schultz MD  PROCEDURE: Consent was requested emergently by emergency room physicain. Patients was monitored while under deep sedation. The transesophogeal probe was passed through the esophogus of the patient. The patient's vital signs; including heart rate, blood pressure, and oxygen saturation; remained stable throughout the procedure. The patient developed no complications during the procedure. IMPRESSIONS  1. Left ventricular ejection fraction, by visual estimation, is 70 to 75%. The left ventricle has normal function. There is no left ventricular hypertrophy.  2. The left ventricle has no regional wall motion abnormalities.  3. Global right ventricle has normal systolic function.The right ventricular size is normal. No increase in right ventricular wall thickness.  4. Left atrial size was normal.  5. Right atrial size was normal.  6. The mitral valve is normal in structure. No evidence of mitral valve regurgitation. No evidence of mitral stenosis.  7. The tricuspid valve is normal in structure.  8. The aortic valve is normal in structure. Aortic valve regurgitation is not visualized. No evidence of aortic valve sclerosis or stenosis.  9. The pulmonic valve was normal in structure. Pulmonic valve regurgitation is not visualized. 10. The inferior vena cava is normal in size with greater than 50% respiratory variability, suggesting right atrial pressure of 3 mmHg. 11. No intracardiac thrombi or masses were visualized. FINDINGS  Left Ventricle: Left ventricular ejection fraction, by visual estimation, is 70 to 75%. The left ventricle has normal function. The left ventricle has no regional wall motion abnormalities. There is no left ventricular hypertrophy. Normal left atrial  pressure. Right Ventricle: The right ventricular size is normal. No increase in right ventricular wall thickness. Global RV systolic function is has normal systolic function. Left Atrium: Left atrial size was normal in size. Right Atrium: Right atrial size was normal in size Pericardium: There is no evidence of pericardial effusion. Mitral Valve: The mitral valve is normal in structure. No evidence of mitral valve stenosis by observation. No evidence of mitral valve regurgitation. Tricuspid Valve: The tricuspid valve is normal in structure. Tricuspid valve regurgitation is mild. Aortic Valve: The aortic valve is normal in structure. Aortic valve regurgitation is not visualized. The aortic valve is structurally normal, with no evidence of sclerosis or stenosis. Pulmonic Valve: The pulmonic valve was normal in structure. Pulmonic  valve regurgitation is not visualized. Aorta: The aortic root, ascending aorta and aortic arch are all structurally normal, with no evidence of dilitation or obstruction. Venous: The inferior vena cava is normal in size with greater than 50% respiratory variability, suggesting right atrial pressure of 3 mmHg. Shunts: Agitated saline contrast was given intravenously to evaluate for intracardiac shunting. Saline contrast bubble study was negative, with no evidence of any interatrial shunt. There is no evidence of a patent foramen ovale. No ventricular septal defect is seen or detected. There is no evidence of an atrial septal defect. No atrial level shunt detected by color flow Doppler. Additional Comments: No intracardiac thrombi or masses were visualized.  Donato SchultzMark Skains MD Electronically signed by Donato SchultzMark Skains MD Signature Date/Time: 03/07/2019/3:19:11 PM    Final    CT HEAD CODE STROKE WO CONTRAST  Result Date: 03/05/2019 CLINICAL DATA:  Code stroke.  Right-sided deficit EXAM: CT HEAD WITHOUT CONTRAST TECHNIQUE: Contiguous axial images were obtained from the base of the skull through the  vertex without intravenous contrast. COMPARISON:  None. FINDINGS: Brain: Ventricle size normal. Hypodensity right thalamus compatible with infarct, probably chronic. Small white matter hypodensities compatible with ischemia, likely chronic. Small chronic infarct left caudate. Negative for acute cortical infarct. Negative for hemorrhage or mass. Vascular: Negative for hyperdense vessel Skull: Negative Sinuses/Orbits: Mild mucosal edema paranasal sinuses. Negative orbit. Other: None ASPECTS ([place]berta Stroke Program Early CT Score) - Ganglionic level infarction (caudate, lentiform nuclei, internal capsule, insula, M1-M3 cortex): 7 - Supraganglionic infarction (M4-M6 cortex): 3 Total score (0-10 with 10 being normal): 10 IMPRESSION: 1. No acute intracranial abnormality. 2. ASPECTS is 10 3. Mild white matter ischemia. Right thalamic infarct appears chronic. No prior studies for comparison. 4. These results were called by telephone at the time of interpretation on 03/05/2019 at 8:30 am to provider Aroor , who verbally acknowledged these results. Electronically Signed   By: Marlan Palauharles  Clark M.D.   On: 03/05/2019 08:31      HISTORY OF PRESENT ILLNESS Renee Rodriguez is a 69 y.o. female with history of hypertension, GERD, arthritis and anemia.  Patient presented to the hospital as a code stroke.  Per EMS, patient woke up at 4 AM and she was normal.  At 630 on 1/5/52021 she noted that she could not feel her right side, was having difficulty getting her words out, and also noticed that she was weak on her right arm and leg.  She admits to being off her blood pressure medications since last February because she did not refill. Initially patient's speech showed no abnormalities however, while in the CT room patient was noted to have increased slurred speech in addition increased right-sided weakness.  Secondary to her symptoms, she was offered TPA with both risks and benefits discussed with her.  Patient agreed to receiving TPA.   There was a slight delay in giving patient TPA as she did have elevated blood pressure which needed to be decreased to systolic of 180 and diastolic of 105 prior to administering TPA.  Labetalol was administered followed by Cleviprex. Premorbid modified Rankin scale (mRS): 0. NIH stroke score: 6. She was admitted to the cardiac ICU.   HOSPITAL COURSE [name] is a 69 y.o. female with history of hypertension, GERD, arthritis and anemia presenting with R sided weakness and inability to get her words out. Received tPA 03/05/2019 at 0840.   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 Small vessel disease. Old R thalamic infarct. ASPECTS 10.     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 Mild ICA bifurcation atherosclerosis. Mild small vessel disease. Degenerative CS.  MRI  Small posterior lentiform nucleus and corona radiata infarct > 2cm in size. Small vessel disease. Old R thalamic lacune and micro hemorrhage. R VA cervical artery occlusion.   Repeat CT head 24h no hemorrhage. evolving L basal ganglia and corona radiata infarct   2D Echo EF 60-65%. No source of embolus. RA dilated.   TEE neg for embolic source.   implantable loop recorder placed 1/7 to evaluate for atrial fibrillation as etiology of stroke ([facility])  We will not perform a TCD bubble given age > 65,  LDL 200  HgbA1c 5.4  UDS positive THC   No antithrombotic prior to admission, started ib aspirin 81 mg and plavix 75 mg daily x 3 weeks, then aspirin alone.   Therapy recommendations:  OP OT, no PT, no SLP  Disposition:  return home  Hypertension  Elevated at stroke onset, requiring IV treatment prior to tPA  Treated with cleviprex in ICU  On lisinopril-HCTZ 20-25 daily, last filled in Feb 2020. Pt states she was tolerating well, just did not refill.    Add lisinopril 10 and HCTZ 25  Will increase to  prior dose at d/c.  Long-term BP goal normotensive  Hyperlipidemia  Home meds:  lipitor 20 in the past but not taking currently  LDL 200, goal < 70  Now on lipitor to 80   Continue statin at d/c  Other Stroke Risk Factors  Advanced age  Cigarette smoker, advised to stop smoking  ETOH use, alcohol level <10, advised to drink no more than 1 drink(s) a day  THC use  Family hx stroke (mother)  Possible Obstructive sleep apnea, needs OP evaluation. Dr. Pearlean Brownie will arrange at time of follow up  DISCHARGE EXAM Blood pressure (!) 181/103, pulse 76, temperature 98 F (36.7 C), temperature source Axillary, resp. rate 20, height 5' (1.524 m), weight 60.8 kg, SpO2 98 %. Pleasant frail middle-aged African-American lady not in distress. . Afebrile. Head is nontraumatic. Neck is supple without bruit.    Cardiac exam no murmur or gallop. Lungs are clear to auscultation. Distal pulses are well felt. Neurological Exam ;  Awake  Alert oriented x 3. Normal speech and language.eye movements full without nystagmus.fundi were not visualized. Vision acuity and fields appear normal. Hearing is normal. Palatal movements are normal. Face symmetric. Tongue midline. Normal strength, tone, reflexes and coordination. Normal sensation. Gait deferred.  Discharge Diet   Heart healthy thin liquids  DISCHARGE PLAN  Disposition:  Return home  Outpatient OT  aspirin 81 mg daily and clopidogrel 75 mg daily for secondary stroke prevention for 3 weeks then ASPIRIN alone.  Ongoing stroke risk factor control by Primary Care Physician at time of discharge  Follow-up PCP Patient, No Pcp Per in 2 weeks.  Follow-up in [name] in 4 weeks, office to schedule an appointment.  Follow-up [facility] for implantable loop recorder wound check in 10-14 days, office to schedule an appointment.  Loop recorder to be monitored by [facility] for atrial fibrillation  as source of stroke. If found, they will notify patient.  35 minutes were spent preparing discharge.  Annie Main, MSN, APRN, ANVP-BC, AGPCNP-BC Advanced Practice Stroke Nurse [facility] See Amion for Schedule &  Pager information 03/07/2019 4:33 PM   I have personally obtained history,examined this patient, reviewed notes, independently viewed imaging studies, participated in medical decision making and plan of care.ROS completed by me personally and pertinent positives fully documented  I have made any additions or clarifications directly to the above note. Agree with note above.   Delia Heady, MD Medical Director [facility] Pager: [phone] 03/07/2019 4:47 PM

## 2019-03-07 NOTE — H&P (View-Only) (Signed)
ELECTROPHYSIOLOGY CONSULT NOTE  Patient ID: Renee Rodriguez MRN: [id number], DOB/AGE: 08/19/1950   Admit date: 03/05/2019 Date of Consult: 03/07/2019  Primary Physician: Patient, No Pcp Per Primary Cardiologist: new to [facility] Reason for Consultation: Cryptogenic stroke; recommendations regarding Implantable Loop Recorder  History of Present Illness EP has been asked to evaluate Renee Rodriguez for placement of an implantable loop recorder to monitor for atrial fibrillation by Dr Pearlean Brownie.  The patient was admitted on 03/05/2019 with sudden onset left hemiplegia.  Imaging demonstrated left basal ganglia and corona radiata infarct felt to be embolic 2/2 unknown source.  She received tPA this admission. .  she has undergone workup for stroke including echocardiogram and carotid imaging.  The patient has been monitored on telemetry which has demonstrated sinus rhythm with no arrhythmias.  Inpatient stroke work-up is to be completed with a TEE.   Echocardiogram this admission demonstrated normal LVEF, LA 29.  Lab work is reviewed.  Prior to admission, the patient denies chest pain, shortness of breath, dizziness, palpitations, or syncope.  They are recovering from their stroke with plans to return home at discharge.    Past Medical History:  Diagnosis Date  . Anemia   . Arthritis   . GERD (gastroesophageal reflux disease)   . Hypertension      Surgical History:  Past Surgical History:  Procedure Laterality Date  . CESAREAN SECTION  08/1982  . 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 TUBAL LIGATION  08/1982,01/1985     Medications Prior to Admission  Medication Sig Dispense Refill Last Dose  . acetaminophen (TYLENOL) 500 MG tablet Take 1,000 mg by mouth every 6 (six) hours as needed for mild pain.   Past Month  at Unknown time    Inpatient Medications:  .  stroke: mapping our early stages of recovery book   Does not apply Once  . aspirin EC  81 mg Oral Daily  . atorvastatin  80 mg Oral Daily  . Chlorhexidine Gluconate Cloth  6 each Topical Daily  . clopidogrel  75 mg Oral Daily  . hydrochlorothiazide  25 mg Oral Daily  . lisinopril  10 mg Oral Daily    Allergies:  Allergies  Allergen Reactions  . Shellfish Allergy Anaphylaxis    Social History   Socioeconomic History  . Marital status: Legally Separated    Spouse name: Not on file  . Number of children: Not on file  . Years of education: Not on file  . Highest education level: Not on file  Occupational History  . Occupation: Building control surveyor  Tobacco Use  . Smoking status: Current Some Day Smoker    Types: Cigarettes    Last attempt to quit: 02/29/1996    Years since quitting: 23.0  . Smokeless tobacco: Never Used  Substance and Sexual Activity  . Alcohol use: Yes    Alcohol/week: 3.0 standard drinks    Types: 3 Standard drinks or equivalent per week    Comment: occasionally beer  . Drug use: No  . Sexual activity: Not on file  Other Topics Concern  . Not on file  Social History Narrative  . Not on file   Social Determinants of Health   Financial Resource Strain:   . Difficulty of Paying Living Expenses: Not on file  Food Insecurity:   .  Worried About Programme researcher, broadcasting/film/videounning Out of Food in the Last Year: Not on file  . Ran Out of Food in the Last Year: Not on file  Transportation Needs:   . Lack of Transportation (Medical): Not on file  . Lack of Transportation (Non-Medical): Not on file  Physical Activity:   . Days of Exercise per Week: Not on file  . Minutes of Exercise per Session: Not on file  Stress:   . Feeling of Stress : Not on file  Social Connections:   . Frequency of Communication with Friends and Family: Not on file  . Frequency of Social Gatherings with Friends and Family: Not on file  . Attends Religious  Services: Not on file  . Active Member of Clubs or Organizations: Not on file  . Attends BankerClub or Organization Meetings: Not on file  . Marital Status: Not on file  Intimate Partner Violence:   . Fear of Current or Ex-Partner: Not on file  . Emotionally Abused: Not on file  . Physically Abused: Not on file  . Sexually Abused: Not on file     Family History  Problem Relation Age of Onset  . Stroke Mother        Cerebral hemorrhage  . Asthma Mother   . Alcohol abuse Mother   . Kidney disease Mother   . Hypertension Mother   . Cirrhosis Mother   . Hypertension Sister   . Colon cancer Neg Hx       Review of Systems: All other systems reviewed and are otherwise negative except as noted above.  Physical Exam: Vitals:   03/07/19 0500 03/07/19 0600 03/07/19 0700 03/07/19 0747  BP: 133/88 (!) 148/99 (!) 148/98 (!) 170/100  Pulse:   70   Resp: 19 20 (!) 22 17  Temp:    98.2 F (36.8 C)  TempSrc:    Oral  SpO2:   99%   Weight:      Height:        GEN- The patient is well appearing, alert and oriented x 3 today.   Head- normocephalic, atraumatic Eyes-  Sclera clear, conjunctiva pink Ears- hearing intact Oropharynx- clear Neck- supple Lungs- Clear to ausculation bilaterally, normal work of breathing Heart- Regular rate and rhythm  GI- soft, NT, ND, + BS Extremities- no clubbing, cyanosis, or edema MS- no significant deformity or atrophy Skin- no rash or lesion Psych- euthymic mood, full affect   Labs:   Lab Results  Component Value Date   WBC 7.2 03/05/2019   HGB 14.7 03/05/2019   HCT 45.0 03/05/2019   MCV 91.6 03/05/2019   PLT 385 03/05/2019    Recent Labs  Lab 03/05/19 0814 03/05/19 0824  NA 138 139  K 4.0 3.8  CL 103 105  CO2 20*  --   BUN 9 11  CREATININE 0.63 0.60  CALCIUM 9.3  --   PROT 7.4  --   BILITOT 1.1  --   ALKPHOS 99  --   ALT 28  --   AST 43*  --   GLUCOSE 105* 105*     Radiology/Studies: CT ANGIO HEAD W OR WO CONTRAST  Result  Date: 03/05/2019 CLINICAL DATA:  Right-sided deficit. EXAM: CT ANGIOGRAPHY HEAD AND NECK TECHNIQUE: Multidetector CT imaging of the head and neck was performed using the standard protocol during bolus administration of intravenous contrast. Multiplanar CT image reconstructions and MIPs were obtained to evaluate the vascular anatomy. Carotid stenosis measurements (when applicable) are obtained utilizing NASCET criteria,  using the distal internal carotid diameter as the denominator. CONTRAST:  75mL OMNIPAQUE IOHEXOL 350 MG/ML SOLN COMPARISON:  CT head without contrast 03/05/2019. FINDINGS: CTA NECK FINDINGS Aortic arch: A 3 vessel arch configuration is present. No significant atherosclerotic change, aneurysm, or stenosis is present. Right carotid system: The right common carotid artery is within normal limits. Noncalcified plaque is present at the carotid bifurcation and proximal right internal carotid artery. Minimal calcifications are present as well. There is no significant stenosis relative to the more distal vessel. The cervical right ICA is otherwise normal. There is slight vessel irregularity in the mid cervical right ICA without significant stenosis. Left carotid system: The left common carotid artery is within normal limits. Minimal atherosclerotic changes present at the proximal left ICA without significant stenosis. There is mild irregularity of the vessel in the mid cervical segment without significant stenosis. Focal calcification is present in the wall the distal left ICA, just below the skull base without significant stenosis. Vertebral arteries: The left vertebral artery is the dominant vessel. It originates from the subclavian artery without significant stenosis. There is no significant stenosis in the left vertebral artery in the neck. The right vertebral artery is occluded. There is no significant reconstitution of the vessel in the neck. Skeleton: Mild endplate degenerative changes are present lower  cervical spine. There straightening of the normal cervical lordosis. No focal lytic or blastic lesions are present. Other neck: The soft tissues the neck are otherwise unremarkable. Salivary glands are within normal limits. No focal mucosal lesions are present. The thyroid is somewhat heterogeneous, but not enlarged. There is no dominant lesion. No followup recommended (ref: J Am Coll Radiol. 2015 Feb;12(2): 143-50). No significant adenopathy is present. Upper chest: The lung apices are clear. Thoracic inlet is within normal limits. Review of the MIP images confirms the above findings CTA HEAD FINDINGS Anterior circulation: Atherosclerotic calcifications are present within the cavernous internal carotid arteries bilaterally. There is no significant stenosis relative to the more distal vessels. ICA termini are within normal limits bilaterally. The A1 and M1 segments are within normal limits. The anterior communicating artery is patent. MCA bifurcations are intact. There is mild distal small vessel irregularity within the ACA and MCA divisions. No significant proximal stenosis or occlusion is present. There is no aneurysm. Posterior circulation: The hypoplastic distal right V4 segment is opacified, likely filling retrograde. Left vertebral artery is within normal limits. PICA origin is visualized and normal. The basilar artery is small. Both posterior cerebral arteries are of fetal type. Small P1 segments contribute, more prominent right than left. Distal segmental narrowing is present within PCA branch vessels without a significant proximal stenosis or occlusion. Venous sinuses: Dural sinuses are patent. The straight sinus deep cerebral veins are intact. Cortical veins are unremarkable. Anatomic variants: Fetal type posterior cerebral arteries bilaterally. Review of the MIP images confirms the above findings IMPRESSION: 1. Occlusion of the right vertebral artery at its origin without significant reconstitution in the  neck or V3 segment. 2. A hypoplastic distal right V4 segment is opacified, likely filling retrograde. 3. Mild atherosclerotic changes at the carotid bifurcations bilaterally without significant stenosis. 4. Mild distal small vessel disease without a significant proximal stenosis, aneurysm, or branch vessel occlusion within the Circle of Willis. 5. Degenerative changes within the cervical spine. Electronically Signed   By: Marin Robertshristopher  Mattern M.D.   On: 03/05/2019 08:55   CT HEAD WO CONTRAST  Result Date: 03/06/2019 CLINICAL DATA:  Stroke, follow-up post tPA EXAM: CT HEAD  WITHOUT CONTRAST TECHNIQUE: Contiguous axial images were obtained from the base of the skull through the vertex without intravenous contrast. COMPARISON:  03/05/2019 FINDINGS: Brain: There is no acute intracranial hemorrhage. Ill-defined low attenuation is present along the acute infarction seen on prior MRI involving left basal ganglia and corona radiata. Chronic small vessel infarct of the right thalamus is again noted. Additional patchy hypoattenuation in the supratentorial white matter likely reflects stable mild chronic microvascular ischemic changes. There is no significant mass effect, hydrocephalus, or extra-axial collection. Vascular: There is atherosclerotic calcification at the skull base. Skull: Calvarium is unremarkable. Sinuses/Orbits: No acute finding. Other: None. IMPRESSION: No acute intracranial hemorrhage. Evolving acute infarction of the left basal ganglia and corona radiata. Stable additional chronic findings detailed above. Electronically Signed   By: Guadlupe Spanish M.D.   On: 03/06/2019 11:46   CT ANGIO NECK W OR WO CONTRAST  Result Date: 03/05/2019 CLINICAL DATA:  Right-sided deficit. EXAM: CT ANGIOGRAPHY HEAD AND NECK TECHNIQUE: Multidetector CT imaging of the head and neck was performed using the standard protocol during bolus administration of intravenous contrast. Multiplanar CT image reconstructions and MIPs were  obtained to evaluate the vascular anatomy. Carotid stenosis measurements (when applicable) are obtained utilizing NASCET criteria, using the distal internal carotid diameter as the denominator. CONTRAST:  59mL OMNIPAQUE IOHEXOL 350 MG/ML SOLN COMPARISON:  CT head without contrast 03/05/2019. FINDINGS: CTA NECK FINDINGS Aortic arch: A 3 vessel arch configuration is present. No significant atherosclerotic change, aneurysm, or stenosis is present. Right carotid system: The right common carotid artery is within normal limits. Noncalcified plaque is present at the carotid bifurcation and proximal right internal carotid artery. Minimal calcifications are present as well. There is no significant stenosis relative to the more distal vessel. The cervical right ICA is otherwise normal. There is slight vessel irregularity in the mid cervical right ICA without significant stenosis. Left carotid system: The left common carotid artery is within normal limits. Minimal atherosclerotic changes present at the proximal left ICA without significant stenosis. There is mild irregularity of the vessel in the mid cervical segment without significant stenosis. Focal calcification is present in the wall the distal left ICA, just below the skull base without significant stenosis. Vertebral arteries: The left vertebral artery is the dominant vessel. It originates from the subclavian artery without significant stenosis. There is no significant stenosis in the left vertebral artery in the neck. The right vertebral artery is occluded. There is no significant reconstitution of the vessel in the neck. Skeleton: Mild endplate degenerative changes are present lower cervical spine. There straightening of the normal cervical lordosis. No focal lytic or blastic lesions are present. Other neck: The soft tissues the neck are otherwise unremarkable. Salivary glands are within normal limits. No focal mucosal lesions are present. The thyroid is somewhat  heterogeneous, but not enlarged. There is no dominant lesion. No followup recommended (ref: J Am Coll Radiol. 2015 Feb;12(2): 143-50). No significant adenopathy is present. Upper chest: The lung apices are clear. Thoracic inlet is within normal limits. Review of the MIP images confirms the above findings CTA HEAD FINDINGS Anterior circulation: Atherosclerotic calcifications are present within the cavernous internal carotid arteries bilaterally. There is no significant stenosis relative to the more distal vessels. ICA termini are within normal limits bilaterally. The A1 and M1 segments are within normal limits. The anterior communicating artery is patent. MCA bifurcations are intact. There is mild distal small vessel irregularity within the ACA and MCA divisions. No significant proximal stenosis or occlusion  is present. There is no aneurysm. Posterior circulation: The hypoplastic distal right V4 segment is opacified, likely filling retrograde. Left vertebral artery is within normal limits. PICA origin is visualized and normal. The basilar artery is small. Both posterior cerebral arteries are of fetal type. Small P1 segments contribute, more prominent right than left. Distal segmental narrowing is present within PCA branch vessels without a significant proximal stenosis or occlusion. Venous sinuses: Dural sinuses are patent. The straight sinus deep cerebral veins are intact. Cortical veins are unremarkable. Anatomic variants: Fetal type posterior cerebral arteries bilaterally. Review of the MIP images confirms the above findings IMPRESSION: 1. Occlusion of the right vertebral artery at its origin without significant reconstitution in the neck or V3 segment. 2. A hypoplastic distal right V4 segment is opacified, likely filling retrograde. 3. Mild atherosclerotic changes at the carotid bifurcations bilaterally without significant stenosis. 4. Mild distal small vessel disease without a significant proximal stenosis,  aneurysm, or branch vessel occlusion within the Circle of Willis. 5. Degenerative changes within the cervical spine. Electronically Signed   By: Marin Roberts M.D.   On: 03/05/2019 08:55   MR BRAIN WO CONTRAST  Result Date: 03/06/2019 CLINICAL DATA:  69 year old female code stroke presentation yesterday with right side deficit. 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 EXAM: MRI HEAD WITHOUT CONTRAST TECHNIQUE: Multiplanar, multiecho pulse sequences of the brain and surrounding structures were obtained without intravenous contrast. COMPARISON:  CTA head and neck and head CT yesterday. FINDINGS: Brain: Linear 12 millimeter restricted diffusion in the posterior left lentiform (series 3, image 17). Separate but nearby small 6 millimeter focus of restricted diffusion in the left posterior periventricular white matter, corona radiata (series 2, image 26). Faint T2 and FLAIR hyperintensity. No associated hemorrhage or mass effect. No other restricted diffusion. Chronic lacunar infarcts in the thalami. Scattered bilateral cerebral white matter T2 and FLAIR hyperintensity which in some and other areas resembles small chronic white matter lacunae (series 7, image 16). Similar patchy T2 and FLAIR hyperintensity in the pons. No cortical encephalomalacia identified. Possible chronic microhemorrhage in the dorsal right thalamus on series 11, image 12. No other chronic cerebral blood products. No midline shift, mass effect, evidence of mass lesion, ventriculomegaly, extra-axial collection or acute intracranial hemorrhage. Cervicomedullary junction and pituitary are within normal limits. Vascular: Major intracranial vascular flow voids are preserved aside from the distal right vertebral artery, where a diminutive V4 segment is faintly apparent. Skull and upper cervical spine: Negative visible cervical spine. Normal bone marrow signal. Sinuses/Orbits: Negative orbits. Paranasal sinuses are well pneumatized. Other:  Mastoids are clear. Grossly normal visible internal auditory structures. Scalp and face soft tissues appear negative. IMPRESSION: 1. Small acute lacunar infarcts in the posterior left lentiform nucleus and the posterior left corona radiata. No associated hemorrhage or mass effect. 2. Underlying chronic small vessel disease, with a prominent chronic lacunar infarct and microhemorrhage in the right thalamus. 3. Evidence of reconstituted diminutive right vertebral V4 and/or PICA in the setting of the cervical Right Vertebral Artery occlusion demonstrated yesterday. Electronically Signed   By: Odessa Fleming M.D.   On: 03/06/2019 09:23   ECHOCARDIOGRAM COMPLETE  Result Date: 03/05/2019   ECHOCARDIOGRAM REPORT   Patient Name:   NEIMA LACROSS Date of Exam: 03/05/2019 Medical Rec #:  [id number]       Height:       60.0 in Accession #:    [id number]      Weight:       127.0 lb Date  of Birth:  [birth year]       BSA:          1.54 m Patient Age:    11 years        BP:           152/89 mmHg Patient Gender: F               HR:           78 bpm. Exam Location:  Inpatient Procedure: 2D Echo Indications:    Stroke 434.91/I163.9  History:        Patient has no prior history of Echocardiogram examinations.                 Risk Factors:Hypertension.  Sonographer:    Clayton Lefort RDCS (AE) Referring Phys: [place] Comments: Patient moving constantly throughout exam. IMPRESSIONS  1. Left ventricular ejection fraction, by visual estimation, is 60 to 65%. The left ventricle has normal function. Left ventricular septal wall thickness was mildly increased. There is mildly increased left ventricular hypertrophy.  2. Left ventricular diastolic parameters are consistent with Grade I diastolic dysfunction (impaired relaxation).  3. Global right ventricle has normal systolic function.The right ventricular size is normal. No increase in right ventricular wall thickness.  4. Left atrial size was mildly dilated.  5. Right atrial  size was normal.  6. The mitral valve is normal in structure. No evidence of mitral valve regurgitation. No evidence of mitral stenosis.  7. The tricuspid valve is normal in structure.  8. The aortic valve is tricuspid. Aortic valve regurgitation is not visualized. No evidence of aortic valve sclerosis or stenosis.  9. The pulmonic valve was normal in structure. Pulmonic valve regurgitation is not visualized. 10. The inferior vena cava is normal in size with greater than 50% respiratory variability, suggesting right atrial pressure of 3 mmHg. 11. No intracardiac source of emboli noted. FINDINGS  Left Ventricle: Left ventricular ejection fraction, by visual estimation, is 60 to 65%. The left ventricle has normal function. The left ventricle has no regional wall motion abnormalities. There is mildly increased left ventricular hypertrophy. Left ventricular diastolic parameters are consistent with Grade I diastolic dysfunction (impaired relaxation). Normal left atrial pressure. Right Ventricle: The right ventricular size is normal. No increase in right ventricular wall thickness. Global RV systolic function is has normal systolic function. Left Atrium: Left atrial size was mildly dilated. Right Atrium: Right atrial size was normal in size Pericardium: There is no evidence of pericardial effusion. Mitral Valve: The mitral valve is normal in structure. No evidence of mitral valve regurgitation. No evidence of mitral valve stenosis by observation. MV peak gradient, 3.9 mmHg. Tricuspid Valve: The tricuspid valve is normal in structure. Tricuspid valve regurgitation is trivial. Aortic Valve: The aortic valve is tricuspid. Aortic valve regurgitation is not visualized. The aortic valve is structurally normal, with no evidence of sclerosis or stenosis. Pulmonic Valve: The pulmonic valve was normal in structure. Pulmonic valve regurgitation is not visualized. Pulmonic regurgitation is not visualized. Aorta: The aortic root and  ascending aorta are structurally normal, with no evidence of dilitation. Venous: The inferior vena cava is normal in size with greater than 50% respiratory variability, suggesting right atrial pressure of 3 mmHg. IAS/Shunts: No atrial level shunt detected by color flow Doppler. There is no evidence of a patent foramen ovale. No ventricular septal defect is seen or detected. There is no evidence of an atrial septal defect.  LEFT VENTRICLE PLAX  2D LVIDd:         3.57 cm  Diastology LVIDs:         1.92 cm  LV e' lateral:   6.85 cm/s LV PW:         1.14 cm  LV E/e' lateral: 7.8 LV IVS:        1.30 cm  LV e' medial:    5.00 cm/s LVOT diam:     1.80 cm  LV E/e' medial:  10.7 LV SV:         42 ml LV SV Index:   26.60 LVOT Area:     2.54 cm  RIGHT VENTRICLE             IVC RV Basal diam:  3.44 cm     IVC diam: 1.59 cm RV S prime:     10.10 cm/s TAPSE (M-mode): 2.3 cm LEFT ATRIUM             Index       RIGHT ATRIUM           Index LA diam:        2.90 cm 1.88 cm/m  RA Area:     14.20 cm LA Vol (A2C):   53.7 ml 34.89 ml/m RA Volume:   36.70 ml  23.85 ml/m LA Vol (A4C):   48.9 ml 31.77 ml/m LA Biplane Vol: 52.6 ml 34.18 ml/m  AORTIC VALVE LVOT Vmax:   125.00 cm/s LVOT Vmean:  78.400 cm/s LVOT VTI:    0.225 m  AORTA Ao Root diam: 3.00 cm Ao Asc diam:  3.30 cm MITRAL VALVE MV Area (PHT): 2.48 cm             SHUNTS MV Peak grad:  3.9 mmHg             Systemic VTI:  0.22 m MV Mean grad:  1.0 mmHg             Systemic Diam: 1.80 cm MV Vmax:       0.99 m/s MV Vmean:      53.4 cm/s MV VTI:        0.21 m MV PHT:        88.74 msec MV Decel Time: 306 msec MV E velocity: 53.60 cm/s 103 cm/s MV A velocity: 96.80 cm/s 70.3 cm/s MV E/A ratio:  0.55       1.5  Weston Brass MD Electronically signed by Weston Brass MD Signature Date/Time: 03/05/2019/10:24:50 PM    Final    CT HEAD CODE STROKE WO CONTRAST  Result Date: 03/05/2019 CLINICAL DATA:  Code stroke.  Right-sided deficit EXAM: CT HEAD WITHOUT CONTRAST TECHNIQUE:  Contiguous axial images were obtained from the base of the skull through the vertex without intravenous contrast. COMPARISON:  None. FINDINGS: Brain: Ventricle size normal. Hypodensity right thalamus compatible with infarct, probably chronic. Small white matter hypodensities compatible with ischemia, likely chronic. Small chronic infarct left caudate. Negative for acute cortical infarct. Negative for hemorrhage or mass. Vascular: Negative for hyperdense vessel Skull: Negative Sinuses/Orbits: Mild mucosal edema paranasal sinuses. Negative orbit. Other: None ASPECTS ([place] Stroke Program Early CT Score) - Ganglionic level infarction (caudate, lentiform nuclei, internal capsule, insula, M1-M3 cortex): 7 - Supraganglionic infarction (M4-M6 cortex): 3 Total score (0-10 with 10 being normal): 10 IMPRESSION: 1. No acute intracranial abnormality. 2. ASPECTS is 10 3. Mild white matter ischemia. Right thalamic infarct appears chronic. No prior studies for comparison. 4. These results were called by telephone  at the time of interpretation on 03/05/2019 at 8:30 am to provider Aroor , who verbally acknowledged these results. Electronically Signed   By: Marlan Palau M.D.   On: 03/05/2019 08:31    12-lead ECG SR (personally reviewed) All prior EKG's in EPIC reviewed with no documented atrial fibrillation  Telemetry SR (personally reviewed)  Assessment and Plan:  1. Cryptogenic stroke The patient presents with cryptogenic stroke.  The patient has a TEE planned for this AM.  I spoke at length with the patient about monitoring for afib with an implantable loop recorder.  Risks, benefits, and alteratives to implantable loop recorder were discussed with the patient today.   At this time, the patient is very clear in their decision to proceed with implantable loop recorder.   Wound care was reviewed with the patient (keep incision clean and dry for 3 days).  Wound check scheduled and entered in AVS.  Please call with  questions.   Gypsy Balsam, NP 03/07/2019 9:11 AM   I have seen, examined the patient, and reviewed the above assessment and plan.  Changes to above are made where necessary.  On exam, RRR.   The patient presents with cryptogenic stroke.  The patient has a TEE planned for this AM.  I spoke at length with the patient about monitoring for afib with an implantable loop recorder.  Risks, benefits, and alteratives to implantable loop recorder were discussed with the patient today.   At this time, the patient is very clear in their decision to proceed with implantable loop recorder.   Please call with questions.    Co Sign: Hillis Range, MD 03/07/2019 11:00 AM

## 2019-03-07 NOTE — Significant Event (Signed)
Rapid Response Event Note  Overview: NIH  Asked by bedside nurse to do ordered NIH. NIH 1. I saw Renee Rodriguez as Code Stroke when she was admitted. After assessing her, we talked, she asked that we pray together so we did.    Janeane Cozart R

## 2019-03-08 NOTE — Anesthesia Postprocedure Evaluation (Signed)
Anesthesia Post Note  Patient: Renee Rodriguez  Procedure(s) Performed: TRANSESOPHAGEAL ECHOCARDIOGRAM (TEE) (N/A ) BUBBLE STUDY     Patient location during evaluation: PACU Anesthesia Type: MAC Level of consciousness: awake and alert Pain management: pain level controlled Vital Signs Assessment: post-procedure vital signs reviewed and stable Respiratory status: spontaneous breathing, nonlabored ventilation and respiratory function stable Cardiovascular status: stable and blood pressure returned to baseline Anesthetic complications: no    Last Vitals:  Vitals:   03/07/19 1453 03/07/19 1600  BP: (!) 181/103   Pulse: 76   Resp: 20   Temp:  36.6 C  SpO2: 98%     Last Pain:  Vitals:   03/07/19 1600  TempSrc: Oral  PainSc:                  Audry Pili

## 2019-03-15 ENCOUNTER — Other Ambulatory Visit: Payer: Self-pay | Admitting: *Deleted

## 2019-03-15 NOTE — Patient Outreach (Signed)
[facility]) Care Management  03/15/2019  Renee Rodriguez 08-26-1950 [id number]    Subjective: Telephone call to patient's home  / mobile number, no answer, left HIPAA compliant voicemail message, and requested call back.    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  Patient also has a history of hypertension , Cigarette smoker, and hyperlipidemia.      Assessment: Received [employer] EMMI Stroke [employer] Alert follow up referral on 03/15/2019.  Red Flag Alert Trigger, Day #3, patient answered yes to the following question: Feeling worse overall?  [facility] EMMI follow up pending patient contact.      Plan: RNCM will send unsuccessful outreach letter, [facility] pamphlet, handout: Know Before You Go, will call patient for 2nd telephone outreach attempt within 4 business days, [facility] EMMI follow up, and proceed with case closure, within 10 business days if no return call.     Renee Rodriguez H. Gardiner Barefoot, BSN, CCM [facility] Care Management [facility] Telephonic CM Phone: [phone] Fax: [phone]

## 2019-03-18 ENCOUNTER — Other Ambulatory Visit: Payer: Self-pay | Admitting: *Deleted

## 2019-03-18 NOTE — Patient Outreach (Signed)
[facility]) Care Management  03/18/2019  Renee Rodriguez 11/15/1950 [id number]   Subjective: Telephone call to patient's home  / mobile number, no answer, message states not accepting calls at this time, and unable to leave a message.   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  Patient also has a history of hypertension , Cigarette smoker, and hyperlipidemia.      Assessment: Received [employer] EMMI Stroke [employer] Alert follow up referral on 03/15/2019.  Red Flag Alert Trigger, Day #3, patient answered yes to the following question: Feeling worse overall?  [facility] EMMI follow up pending patient contact.      Plan: RNCM has sent unsuccessful outreach letter, [facility] pamphlet, handout: Know Before You Go, will call patient for 3rd telephone outreach attempt within 4 business days, [facility] EMMI follow up, and proceed with case closure, within 10 business days if no return call.    Elijan Googe H. Gardiner Barefoot, BSN, CCM [facility] Care Management [facility] Telephonic CM Phone: [phone] Fax: [phone]

## 2019-03-20 ENCOUNTER — Ambulatory Visit: Payer: Self-pay | Admitting: *Deleted

## 2019-03-21 ENCOUNTER — Other Ambulatory Visit: Payer: Self-pay | Admitting: *Deleted

## 2019-03-21 NOTE — Patient Outreach (Addendum)
[facility]) Care Management  03/21/2019  Renee Rodriguez 02/20/1951 [id number]   Subjective: Telephone call to patient's home  / mobile number, no answer, left HIPAA compliant voicemail message, and requested call back.    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  Patient also has a history of hypertension , Cigarette smoker, and hyperlipidemia.      Assessment: Received [employer] EMMI Stroke [employer] Alert follow up referral on 03/15/2019.  Red Flag Alert Trigger, Day #3, patient answered yes to the following question: Feeling worse overall?  Renee Rodriguez EMMI follow up pending patient contact.      Plan: RNCM has sent unsuccessful outreach letter, [facility] pamphlet, handout: Know Before You Go, will call patient for telephone outreach attempt within 4 weeks, [facility] EMMI follow up, if patient does not respond within 10 business days / if no return call , will schedule outreach to patient within 2- 4 weeks.     Renee Rodriguez, BSN, CCM [facility] Care Management [facility] [facility] Telephonic CM Phone: [phone] Fax: [phone]

## 2019-03-28 ENCOUNTER — Other Ambulatory Visit: Payer: Self-pay

## 2019-03-28 ENCOUNTER — Ambulatory Visit (INDEPENDENT_AMBULATORY_CARE_PROVIDER_SITE_OTHER): Payer: Medicare Other | Admitting: *Deleted

## 2019-03-28 DIAGNOSIS — I63412 Cerebral infarction due to embolism of left middle cerebral artery: Secondary | ICD-10-CM

## 2019-03-28 LAB — CUP PACEART INCLINIC DEVICE CHECK
Date Time Interrogation Session: 20210128143015
Implantable Pulse Generator Implant Date: 20210107

## 2019-03-28 NOTE — Progress Notes (Signed)
ILR wound check in clinic. Steri strips removed by patient prior to visit. Wound well healed. No episodes. Carelink app last updated 03/24/19, educated patient about importance of keeping app open and running in background for monitoring purposes. Assisted with re-opening app today. Patient verbalizes understanding. Monthly summary reports and ROV with Dr. Johney Frame PRN.

## 2019-04-08 ENCOUNTER — Ambulatory Visit (INDEPENDENT_AMBULATORY_CARE_PROVIDER_SITE_OTHER): Payer: Medicare Other | Admitting: *Deleted

## 2019-04-08 DIAGNOSIS — I63412 Cerebral infarction due to embolism of left middle cerebral artery: Secondary | ICD-10-CM | POA: Diagnosis not present

## 2019-04-09 ENCOUNTER — Other Ambulatory Visit: Payer: Self-pay

## 2019-04-09 ENCOUNTER — Encounter: Payer: Self-pay | Admitting: Adult Health

## 2019-04-09 ENCOUNTER — Ambulatory Visit (INDEPENDENT_AMBULATORY_CARE_PROVIDER_SITE_OTHER): Payer: Medicare Other | Admitting: Adult Health

## 2019-04-09 VITALS — BP 122/80 | HR 91 | Temp 96.9°F | Ht 60.0 in | Wt 119.6 lb

## 2019-04-09 DIAGNOSIS — E785 Hyperlipidemia, unspecified: Secondary | ICD-10-CM

## 2019-04-09 DIAGNOSIS — R29818 Other symptoms and signs involving the nervous system: Secondary | ICD-10-CM | POA: Diagnosis not present

## 2019-04-09 DIAGNOSIS — I10 Essential (primary) hypertension: Secondary | ICD-10-CM

## 2019-04-09 DIAGNOSIS — I639 Cerebral infarction, unspecified: Secondary | ICD-10-CM | POA: Diagnosis not present

## 2019-04-09 NOTE — Progress Notes (Signed)
I agree with the above plan 

## 2019-04-09 NOTE — Progress Notes (Signed)
[name] [address] [place]. [name] [address] [phone]       [place] Renee Rodriguez Date of Birth:  1950-10-10 Medical Record Number:  [id number]   Reason for Referral:  hospital stroke follow up    CHIEF COMPLAINT:  Chief Complaint  Patient presents with  . Hospitalization Follow-up    Alone. Rm 9. Patient mentioned that she has been having some cramps on her left side and leg.     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  History obtained from patient and chart review. Reviewed all radiology images and labs personally.  ReneeMakeshia C Waldenis a 69 y.o.femalewith history of hypertension, GERD, arthritis and anemiawho presented on 03/05/2019 with R sided weakness and inability to get her words out. Evaluated by stroke team and Dr. Leonie Man with stroke work-up revealing left basal ganglia and corona radiata infarcts as evidenced on MRI embolic pattern secondary to unknown source. NIHSS score 6 and CT head no evidence of hemorrhage therefore received tPA 03/05/2019 at [facility]. In addition to acute infarcts, MRI also showed small vessel disease, old right thalamic lacune, microhemorrhage and right VA cervical artery occlusion.  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  Repeat CT head 24 hours post TPA no evidence of hemorrhage with evolving left basal ganglia and corona radiata infarct.  2D echo unremarkable.  TEE negative for cardiac source of embolus.  Loop recorder placed to evaluate for atrial fibrillation as etiology of recent stroke.  Recommended DAPT for 3 weeks and aspirin alone as she is not previously on antithrombotic.  History of HTN elevated upon presentation and treated with Cleviprex when noncompliance of  prior home dose antihypertensive and recommended long-term pedal normotensive range.  LDL 200 previously prescribed atorvastatin 20 mg daily but reported noncompliance and recommended initiating atorvastatin 80 mg daily.  No evidence or history of DM with A1c 5.4.  UDS positive for THC.  Other stroke risk factors include advanced age, tobacco use, EtOH use, THC use, family history of stroke and possible OSA.  Discharged home in stable condition recommendation of outpatient therapy.  Renee Rodriguez is a 69 year old female who is being seen today for hospital follow-up.  She has been recovering well from a stroke standpoint with only mild left hand decreased fine motor control and occasional word finding difficulty.  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  She is questioning if she can return at this time.  She has completed 3 weeks DAPT and continues on aspirin alone without bleeding or bruising.  Continues on atorvastatin 80 mg daily and does complain of mild left calf pain and left side pain. She states initially feeling on right side cramping but now left. Cramping does not limit daily activity or functioning. She does have follow up with PCP in the near future and per patient, will have lab work obtained including lipid panel. Blood pressure today satisfactory at 122/80. Loop recorder has not shown atrial fibrillation thus far. Discussion regarding suspected sleep apnea during admission. Does admit to morning headaches, day time fatigue and insomnia. She has not previously underwent sleep study. She denies complete tobacco cessation. She continues to use THC occasionally but has  been trying to decrease. Typically uses to help insomnia and anxiety. Loop recorder has not shown atrial fibrillation thus far. Denies new or worsening stroke/TIA symptoms.       ROS:   14 system review of systems performed and  negative with exception of fatigue, insomnia  PMH:  Past Medical History:  Diagnosis Date  . Anemia   . Arthritis   . GERD (gastroesophageal reflux disease)   . Hypertension     PSH:  Past Surgical History:  Procedure Laterality Date  . BUBBLE STUDY  03/07/2019   Procedure: BUBBLE STUDY;  Surgeon: Jake Bathe, MD;  Location: [facility] ENDOSCOPY;  Service: Cardiovascular;;  . CESAREAN SECTION  08/1982  . LOOP RECORDER INSERTION N/A 03/07/2019   Procedure: LOOP RECORDER INSERTION;  Surgeon: Hillis Range, MD;  Location: [facility] INVASIVE CV LAB;  Service: Cardiovascular;  Laterality: N/A;  . TEE WITHOUT CARDIOVERSION N/A 03/07/2019   Procedure: TRANSESOPHAGEAL ECHOCARDIOGRAM (TEE);  Surgeon: Jake Bathe, MD;  Location: [facility] ENDOSCOPY;  Service: Cardiovascular;  Laterality: N/A;  . 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 TUBAL LIGATION  08/1982,01/1985    Social History:  Social History   Socioeconomic History  . Marital status: Legally Separated    Spouse name: Not on file  . Number of children: Not on file  . Years of education: Not on file  . Highest education level: Not on file  Occupational History  . Occupation: Building control surveyor  Tobacco Use  . Smoking status: Current Some Day Smoker    Types: Cigarettes    Last attempt to quit: 02/29/1996    Years since quitting: 23.1  . Smokeless tobacco: Never Used  Substance and Sexual Activity  . Alcohol use: Yes    Alcohol/week: 3.0 standard drinks    Types: 3 Standard drinks or equivalent per week    Comment: occasionally beer  . Drug use: No  . Sexual activity: Not on file  Other Topics Concern  . Not on file  Social History Narrative  . Not on file   Social Determinants of Health   Financial Resource Strain:   . Difficulty of Paying Living Expenses:  Not on file  Food Insecurity:   . Worried About Programme researcher, broadcasting/film/video in the Last Year: Not on file  . Ran Out of Food in the Last Year: Not on file  Transportation Needs:   . Lack of Transportation (Medical): Not on file  . Lack of Transportation (Non-Medical): Not on file  Physical Activity:   . Days of Exercise per Week: Not on file  . Minutes of Exercise per Session: Not on file  Stress:   . Feeling of Stress : Not on file  Social Connections:   . Frequency of Communication with Friends and Family: Not on file  . Frequency of Social Gatherings with Friends and Family: Not on file  . Attends Religious Services: Not on file  . Active Member of Clubs or Organizations: Not on file  . Attends Banker Meetings: Not on file  . Marital Status: Not on file  Intimate Partner Violence:   . Fear of Current or Ex-Partner: Not on file  . Emotionally Abused: Not on file  . Physically Abused: Not on file  . Sexually Abused: Not on file    Family  History:  Family History  Problem Relation Age of Onset  . Stroke Mother        Cerebral hemorrhage  . Asthma Mother   . Alcohol abuse Mother   . Kidney disease Mother   . Hypertension Mother   . Cirrhosis Mother   . Hypertension Sister   . Colon cancer Neg Hx     Medications:   Current Outpatient Medications on File Prior to Visit  Medication Sig Dispense Refill  . aspirin EC 81 MG EC tablet Take 1 tablet (81 mg total) by mouth daily.    Marland Kitchen atorvastatin (LIPITOR) 80 MG tablet Take 1 tablet (80 mg total) by mouth daily. 30 tablet 2  . hydrochlorothiazide (HYDRODIURIL) 25 MG tablet Take 1 tablet (25 mg total) by mouth daily. 30 tablet 2  . lisinopril (ZESTRIL) 20 MG tablet Take 1 tablet (20 mg total) by mouth daily. 30 tablet 2  . acetaminophen (TYLENOL) 500 MG tablet Take 1,000 mg by mouth every 6 (six) hours as needed for mild pain.     No current facility-administered medications on file prior to visit.    Allergies:     Allergies  Allergen Reactions  . Shellfish Allergy Anaphylaxis     Physical Exam  Vitals:   04/09/19 1056  BP: 122/80  Pulse: 91  Temp: (!) 96.9 F (36.1 C)  TempSrc: Oral  Weight: 119 lb 9.6 oz (54.3 kg)  Height: 5' (1.524 m)   Body mass index is 23.36 kg/m. No exam data present  Depression screen [facility] 2/9 04/09/2019  Decreased Interest 0  Down, Depressed, Hopeless 0  PHQ - 2 Score 0  Altered sleeping -  Tired, decreased energy -  Change in appetite -  Feeling bad or failure about yourself  -  Trouble concentrating -  Moving slowly or fidgety/restless -  Suicidal thoughts -  PHQ-9 Score -    General: well developed, well nourished, pleasant elderly AA female, seated, in no evident distress Head: head normocephalic and atraumatic.   Neck: supple with no carotid or supraclavicular bruits Cardiovascular: regular rate and rhythm, no murmurs Musculoskeletal: no deformity Skin:  no rash/petichiae Vascular:  Normal pulses all extremities   Neurologic Exam Mental Status: Awake and fully alert. Occasional word finding difficulty/hesitancy. Oriented to place and time. Recent and remote memory intact. Attention span, concentration and fund of knowledge appropriate. Mood and affect appropriate.  Cranial Nerves: Fundoscopic exam reveals sharp disc margins. Pupils equal, briskly reactive to light. Extraocular movements full without nystagmus. Visual fields full to confrontation. Hearing intact. Facial sensation intact. Face, tongue, palate moves normally and symmetrically.  Motor: Normal bulk and tone. Normal strength in all tested extremity muscles except mild decreased left hand dexterity. Sensory.: intact to touch , pinprick , position and vibratory sensation.  Coordination: Rapid alternating movements normal in all extremities except mildly decreased left hand. Finger-to-nose and heel-to-shin performed accurately bilaterally. Gait and Station: Arises from chair without  difficulty. Stance is normal. Gait demonstrates normal stride length and balance Reflexes: 1+ and symmetric. Toes downgoing.     NIHSS  1 Modified Rankin  2    Diagnostic Data (Labs, Imaging, Testing)  CT HEAD WO CONTRAST 03/05/2019 IMPRESSION: 1. No acute intracranial abnormality. 2. ASPECTS is 10 3. Mild white matter ischemia. Right thalamic infarct appears chronic. No prior studies for comparison.  03/06/2019 (24-hour post TPA) IMPRESSION: No acute intracranial hemorrhage. Evolving acute infarction of the left basal ganglia and corona radiata. Stable additional chronic findings detailed above.  CT ANGIO HEAD W OR WO CONTRAST CT ANGIO NECK W OR WO CONTRAST 03/05/2019 IMPRESSION: 1. Occlusion of the right vertebral artery at its origin without significant reconstitution in the neck or V3 segment. 2. A hypoplastic distal right V4 segment is opacified, likely filling retrograde. 3. Mild atherosclerotic changes at the carotid bifurcations bilaterally without significant stenosis. 4. Mild distal small vessel disease without a significant proximal stenosis, aneurysm, or branch vessel occlusion within the Circle of Willis. 5. Degenerative changes within the cervical spine.  MR BRAIN WO CONTRAST 03/06/2019 IMPRESSION: 1. Small acute lacunar infarcts in the posterior left lentiform nucleus and the posterior left corona radiata. No associated hemorrhage or mass effect. 2. Underlying chronic small vessel disease, with a prominent chronic lacunar infarct and microhemorrhage in the right thalamus. 3. Evidence of reconstituted diminutive right vertebral V4 and/or PICA in the setting of the cervical Right Vertebral Artery occlusion demonstrated yesterday.  ECHOCARDIOGRAM 03/05/2019 IMPRESSIONS  1. Left ventricular ejection fraction, by visual estimation, is 60 to  65%. The left ventricle has normal function. Left ventricular septal wall  thickness was mildly increased. There is  mildly increased left ventricular  hypertrophy.  2. Left ventricular diastolic parameters are consistent with Grade I  diastolic dysfunction (impaired relaxation).  3. Global right ventricle has normal systolic function.The right  ventricular size is normal. No increase in right ventricular wall  thickness.  4. Left atrial size was mildly dilated.  5. Right atrial size was normal.  6. The mitral valve is normal in structure. No evidence of mitral valve  regurgitation. No evidence of mitral stenosis.  7. The tricuspid valve is normal in structure.  8. The aortic valve is tricuspid. Aortic valve regurgitation is not  visualized. No evidence of aortic valve sclerosis or stenosis.  9. The pulmonic valve was normal in structure. Pulmonic valve  regurgitation is not visualized.  10. The inferior vena cava is normal in size with greater than 50%  respiratory variability, suggesting right atrial pressure of 3 mmHg.  11. No intracardiac source of emboli noted.   ECHO TEE 03/07/2019 IMPRESSIONS  1. Left ventricular ejection fraction, by visual estimation, is 70 to  75%. The left ventricle has normal function. There is no left ventricular  hypertrophy.  2. The left ventricle has no regional wall motion abnormalities.  3. Global right ventricle has normal systolic function.The right  ventricular size is normal. No increase in right ventricular wall  thickness.  4. Left atrial size was normal.  5. Right atrial size was normal.  6. The mitral valve is normal in structure. No evidence of mitral valve  regurgitation. No evidence of mitral stenosis.  7. The tricuspid valve is normal in structure.  8. The aortic valve is normal in structure. Aortic valve regurgitation is  not visualized. No evidence of aortic valve sclerosis or stenosis.  9. The pulmonic valve was normal in structure. Pulmonic valve  regurgitation is not visualized.  10. The inferior vena cava is normal in size  with greater than 50%  respiratory variability, suggesting right atrial pressure of 3 mmHg.  11. No intracardiac thrombi or masses were visualized.      ASSESSMENT: MAKAYLE Renee Rodriguez is a 69 y.o. year old female presented with right-sided weakness and expressive aphasia on 03/05/2019 with stroke work-up revealing left basal ganglia and corona radiata infarcts s/p TPA secondary to unknown source.  Loop recorder placed for long-term monitoring of possible atrial fibrillation and stroke etiology.  Vascular risk factors include HTN,  HLD, tobacco use, THC use, EtOH use, medication noncompliance and suspected OSA. She has been recovering well from stroke standpoint with residual occasional word finding difficulty and slightly decreased left hand dexterity    PLAN:  1. Cryptogenic stroke: Continue aspirin 81 mg daily  and atorvastatin 80 mg daily for secondary stroke prevention. Maintain strict control of hypertension with blood pressure goal below 130/90, diabetes with hemoglobin A1c goal below 6.5% and cholesterol with LDL cholesterol (bad cholesterol) goal below 70 mg/dL.  I also advised the patient to eat a healthy diet with plenty of whole grains, cereals, fruits and vegetables, exercise regularly with at least 30 minutes of continuous activity daily and maintain ideal body weight.  Continue to monitor loop recorder for possible atrial fibrillation 2. HTN: Advised to continue current treatment regimen.  Today's BP stable.  Advised to continue to monitor at home along with continued follow-up with PCP for management 3. HLD: Discussion regarding possible statin myalgias with high intensity statin.  She would like to trial use of co-Q10 to see if this helps mild cramps and plans on following up with PCP in the near future for repeat lab work.  Advised to continue current treatment regimen along with continued follow-up with PCP for future prescribing and monitoring of lipid panel 4. Suspected OSA: referral  placed to [facility] sleep clinic for evaluation of possible sleep apnea 5. Tobacco use: complete cessation and congratulated and encouraged ongoing use  6. THC use: encouraged complete cessation for secondary stroke prevention 7. 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  She is currently employed as a Furniture conservator/restorer which is recommended to continue.  She will call office when she plans on returning to work for a return to work letter clearance 8. [place] trial: Discussion regarding possible participation in [place] trial.  Despite MRI report stating lacunar infarct, reviewed images with  Dr. Pearlean Brownie which showed DWI imaging >2cm and flair >1.5 which qualifies for cryptogenic etiology.  She was provided with additional information by [facility] research team as she was interested in possible participation    Follow up in 4 months or call earlier if needed   Greater than 50% of time during this 45 minute visit was spent on counseling, explanation of diagnosis of cryptogenic stroke, reviewing risk factor management of HTN, HLD, suspected OSA, history of tobacco use and THC use, discussion regarding importance of medication compliance, planning of further management along with potential future management, and discussion with patient and answering all questions.    Ihor Austin, AGNP-BC  [facility] [address] [place], [place] [address]  Phone [phone] Fax [phone] Note: This document was prepared with digital dictation and possible smart phrase technology. Any transcriptional errors that result from this process are unintentional.

## 2019-04-09 NOTE — Patient Instructions (Signed)
Continue aspirin 81 mg daily  and lipitor 80mg  daily  for secondary stroke prevention  Try CoQ10 enzyme 300mg  daily to help with muscle cramping  Continue to follow up with PCP regarding cholesterol and blood pressure management   Consider participation in [place] trial   We will continue to monitor loop recorder for atrial fibrillation   Continue to monitor blood pressure at home  Maintain strict control of hypertension with blood pressure goal below 130/90, diabetes with hemoglobin A1c goal below 6.5% and cholesterol with LDL cholesterol (bad cholesterol) goal below 70 mg/dL. I also advised the patient to eat a healthy diet with plenty of whole grains, cereals, fruits and vegetables, exercise regularly and maintain ideal body weight.  Followup in the future with me in 4 months or call earlier if needed       Thank you for coming to see at [facility]. I hope we have been able to provide you high quality care today.  You may receive a patient satisfaction survey over the next few weeks. We would appreciate your feedback and comments so that we may continue to improve ourselves and the health of our patients.

## 2019-04-10 LAB — CUP PACEART REMOTE DEVICE CHECK
Date Time Interrogation Session: 20210209172510
Implantable Pulse Generator Implant Date: 20210107

## 2019-04-16 ENCOUNTER — Ambulatory Visit: Payer: Self-pay | Admitting: *Deleted

## 2019-04-17 ENCOUNTER — Encounter: Payer: Self-pay | Admitting: *Deleted

## 2019-04-17 ENCOUNTER — Ambulatory Visit: Payer: Medicare Other | Admitting: Neurology

## 2019-04-17 ENCOUNTER — Encounter: Payer: Self-pay | Admitting: Neurology

## 2019-04-17 ENCOUNTER — Other Ambulatory Visit: Payer: Self-pay | Admitting: *Deleted

## 2019-04-17 ENCOUNTER — Other Ambulatory Visit: Payer: Self-pay

## 2019-04-17 VITALS — BP 159/90 | HR 68 | Temp 97.6°F | Ht 61.0 in | Wt 124.0 lb

## 2019-04-17 DIAGNOSIS — F329 Major depressive disorder, single episode, unspecified: Secondary | ICD-10-CM

## 2019-04-17 DIAGNOSIS — I639 Cerebral infarction, unspecified: Secondary | ICD-10-CM | POA: Insufficient documentation

## 2019-04-17 DIAGNOSIS — I10 Essential (primary) hypertension: Secondary | ICD-10-CM | POA: Diagnosis not present

## 2019-04-17 DIAGNOSIS — I63412 Cerebral infarction due to embolism of left middle cerebral artery: Secondary | ICD-10-CM | POA: Diagnosis not present

## 2019-04-17 DIAGNOSIS — R29818 Other symptoms and signs involving the nervous system: Secondary | ICD-10-CM

## 2019-04-17 DIAGNOSIS — Z72821 Inadequate sleep hygiene: Secondary | ICD-10-CM

## 2019-04-17 DIAGNOSIS — F5104 Psychophysiologic insomnia: Secondary | ICD-10-CM

## 2019-04-17 MED ORDER — SERTRALINE HCL 25 MG PO TABS: 25.0000 mg | ORAL_TABLET | Freq: Every day | ORAL | 5 refills | Status: DC

## 2019-04-17 NOTE — Patient Outreach (Addendum)
[facility]) Care Management  04/17/2019  Renee Rodriguez 1950/08/02 [id number]   Subjective: Telephone call to patient's home / mobile number, spoke with patient, and HIPAA verified.  Discussed [facility] Care Management [employer] EMMI Red Flag Alert follow up, patient voiced understanding, and is in agreement to follow up.   Patient states she remembers receiving EMMI automated calls.   Patient states she is doing pretty good but having some periodic of depression related to stroke.  States she is unable to work at this time due to stroke, is use to being independent, and feeling a little down having to depend on others. States she has discussed her feeling with neurologist today during follow up appointment related to sleep apnea, appointment went well, was prescribed medication for depression, and today will start medication.  States she will monitor how medication makes her feel and follow up with MD as needed.  Depression screening assessment completed, results discussed with patient, patient voices understanding, patient in agreement to a referral to [facility] Care Management Social Worker for depression screening follow up, has given permission for Child psychotherapist to discuss screening results with patient's provider as needed, and Child psychotherapist will provide counseling / behavioral health community resources per patient's request.  Patient states she has never been diagnosed with depression by a provider and self- diagnosed herself in the past, has never been on medications in the past, or had counseling.   Discussed Advanced Directives, advised of [facility] Care Management Social Worker  Advanced Directives document completion benefit, patient voices understanding, and in agreement to a referral to Child psychotherapist will to ask Social Worker to send [employer], and follow up on document completion.   Patient states she would like to also be referred to Child psychotherapist for  assistance with food stamp resources and is in agreement to a referral to Child psychotherapist.  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    Patient states she is able to manage self care and has assistance as needed. Patient voices understanding of medical diagnosis and treatment plan.  States she is accessing her [employer] benefits as needed via member services number on back of card.  Patient states she does not have any education material, EMMI follow up, care coordination, care management, disease monitoring, transportation,  or pharmacy needs at this time.  States she is very appreciative of the follow up and is in agreement to receive [facility] Care Management services.     Objective:Per KPN (Knowledge Performance Now, point of care tool) and chart review,patient hospitalized 03/05/2019 -03/07/2019 for Embolic stroke involving left middle cerebral artery, status posttPA cryptogenic etiology. Patient also has a history of hypertension , Cigarette smoker, and hyperlipidemia.      Assessment: Received [employer] EMMI Stroke [employer] Alert follow up referral on 03/15/2019. Red Flag Alert Trigger, Day #3, patient answered yes to the following question: Feeling worse overall?EMMI follow up completed.  Will refer patient to [facility] Care Management Social Worker for depression screening follow up, Advanced Directives follow up, and assistance with food stamp resources.     Plan:RNCM will refer patient to [facility] Care Management Social Worker for depression screening follow up, Advanced Directives follow up, and assistance with food stamp resources.    Renee Rodriguez, BSN, CCM [facility] Care  Management [facility] Telephonic CM Phone: [phone] Fax: [phone]

## 2019-04-17 NOTE — Patient Instructions (Signed)

## 2019-04-17 NOTE — Progress Notes (Signed)
Renee Rodriguez    SLEEP MEDICINE CLINIC    Provider:  Larey Seat, MD  Primary Care Physician:  Renee Rodriguez, Renee Rodriguez [address] [place] [address]     Referring Provider: Loretha Rodriguez, Renee Rodriguez,  [place] [address]          Chief Complaint according to patient   Patient presents with:    . New Patient (Initial Visit)           HISTORY OF PRESENT ILLNESS:  Reason for Referral:  Hospital stroke follow up, Renee Rodriguez- seen on 04-09-2019 by Renee Rodriguez . \Now here for sleep evaluation.   PCP is FNP Renee Rob, FNP- just established after stroke      CHIEF COMPLAINT:  Chief Complaint  Patient presents with  . New Patient (Initial Visit)    pt alone, rm 10. pt states that she has been told that she stopped breathing in sleep. 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    Renee Rodriguez a 69 y.o.femalewith history of hypertension, GERD, arthritis and anemiawho presented on 03/05/2019 with R sided weakness and inability to get her words out. Evaluated by stroke team and Renee Rodriguez with stroke Rodriguez revealing left basal ganglia and corona radiata infarcts as evidenced on MRI embolic pattern secondary to unknown source. NIHSS score 6 and CT head no evidence of hemorrhage therefore received tPA 03/05/2019 at [place]. In addition to acute infarcts, MRI also showed small vessel disease, old right thalamic lacune, microhemorrhage and right VA cervical artery occlusion.  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  Repeat CT head 24 hours post TPA no evidence of  hemorrhage with evolving left basal ganglia and corona radiata infarct.  2D echo unremarkable.  TEE negative for cardiac source of embolus.  Loop recorder placed to evaluate for atrial fibrillation as etiology of recent stroke.  Recommended DAPT for 3 weeks and aspirin alone as she is not previously on antithrombotic.  History of HTN elevated upon presentation and treated with Cleviprex when noncompliance of prior home dose antihypertensive and recommended long-term pedal normotensive range.  LDL 200 previously prescribed atorvastatin 20 mg daily but reported noncompliance and recommended initiating atorvastatin 80 mg daily.  No evidence or history of DM with A1c 5.4.  UDS positive for THC.  Other stroke risk factors include advanced age, tobacco use, EtOH use, THC use, family history of stroke and possible OSA.  Discharged home in stable condition recommendation of outpatient therapy.  Renee Rodriguez is a 69 year old female who is being seen today for hospital follow-up.  She has been recovering well from a stroke standpoint with only mild left hand decreased fine motor control and occasional word finding difficulty.  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  She is questioning if she can return at this time.  She has completed 3 weeks DAPT and continues on aspirin alone without bleeding or bruising.  Continues on atorvastatin 80 mg daily and does  complain of mild left calf pain and left side pain. She states initially feeling on right side cramping but now left. Cramping does not limit daily activity or functioning. She does have follow up with PCP in the near future and per patient, will have lab work obtained including lipid panel. Blood pressure today satisfactory at 122/80. Loop recorder has not shown atrial fibrillation thus far. Discussion regarding suspected sleep apnea during admission. Does admit to  morning headaches, day time fatigue and insomnia. She has not previously underwent sleep study. She denies complete tobacco cessation. She continues to use THC occasionally but has been trying to decrease. Typically uses to help insomnia and anxiety. Loop recorder has not shown atrial fibrillation thus far. Denies new or worsening stroke/TIA symptoms.    04-17-2019:    I have the pleasure of seeing Renee Rodriguez today, a 69 year-old, right -handed Black or [place] American female with a possible sleep disorder.  She has a  has a past medical history of Anemia, Arthritis, GERD (gastroesophageal reflux disease), and Hypertension.. CRYPTOGENIC stroke- see above referral note form Dr. Pearlean Brownie, MD.   The patient never underwent previously any sleep related medical evaluation, but during her stroke evaluation in hospital she was told by a medical professional that she stops breathing at night.  The patient is not aware of apnea she has never been told that she snores.  Social history:  Patient went to [employer], did not graduate HS, got a GED . 1988.  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   The patient used to work in shifts( Chief Technology Officer,) Pets are present. 2 dogs and a cat, and a hamster.  Tobacco use: 1998- restarted and quit again in January 2021.  ETOH use: beer ,  Caffeine intake in form of Coffee( 2-3 daily) Soda( 16 ounces a day) Tea ( none) or energy drinks. Regular exercise- none, has PT.      Sleep habits are as follows: The patient's dinner time is between midnight and 8 AM, The patient goes to bed at 2 PM and continues to sleep until 8 or 9 Om and then has trouble to sleep again, she , wakes for frequently throughout the night.    The preferred sleep position is left, with the support of one pillow.  Dreams are reportedly  rare. Before the stroke her  rise time was 4  AM is the usual rise time.  The patient wakes up spontaneously.  She reports not feeling refreshed or restored in AM, with symptoms such as dry mouth, morning headaches, and residual fatigue. She is vision changes.  Naps are taken very frequently, lasting from from 1- 5 hours , and she feels depressed.    Review of Systems: Out of a complete 14 system review, the patient complains of only the following symptoms, and all other reviewed systems are negative.:  Fatigue, sleepiness , snoring, fragmented sleep, Insomnia - wild, free running sleep wake cycles.  No sleep hygiene    How likely are you to doze in the following situations: 0 = not likely, 1 = slight chance, 2 = moderate chance, 3 = high chance   Sitting and Reading? Watching Television? Sitting inactive in a public place (theater or meeting)? As a passenger in a car for an hour without a break? Lying down in the afternoon  when circumstances permit? Sitting and talking to someone? Sitting quietly after lunch without alcohol? In a car, while stopped for a few minutes in traffic?   Total = 17/ 24 points   FSS endorsed at 53 / 63 points.   She is severely depressed.   Social History   Socioeconomic History  . Marital status: Legally Separated    Spouse name: Not on file  . Number of children: Not on file  . Years of education: Not on file  . Highest education level: Not on file  Occupational History  . Occupation: Building control surveyor  Tobacco Use  . Smoking status: Current Some Day Smoker    Types: Cigarettes    Last attempt to quit: 02/29/1996    Years since quitting: 23.1  . Smokeless tobacco: Never Used  Substance and Sexual Activity  . Alcohol use: Yes    Alcohol/week: 3.0 standard drinks    Types: 3 Standard drinks or equivalent per week    Comment: occasionally beer  . Drug use: No  . Sexual activity: Not on file  Other Topics Concern  . Not on file  Social  History Narrative  . Not on file   Social Determinants of Health   Financial Resource Strain:   . Difficulty of Paying Living Expenses: Not on file  Food Insecurity:   . Worried About Programme researcher, broadcasting/film/video in the Last Year: Not on file  . Ran Out of Food in the Last Year: Not on file  Transportation Needs:   . Lack of Transportation (Medical): Not on file  . Lack of Transportation (Non-Medical): Not on file  Physical Activity:   . Days of Exercise per Week: Not on file  . Minutes of Exercise per Session: Not on file  Stress:   . Feeling of Stress : Not on file  Social Connections:   . Frequency of Communication with Friends and Family: Not on file  . Frequency of Social Gatherings with Friends and Family: Not on file  . Attends Religious Services: Not on file  . Active Member of Clubs or Organizations: Not on file  . Attends Banker Meetings: Not on file  . Marital Status: Not on file    Family History  Problem Relation Age of Onset  . Stroke Mother        Cerebral hemorrhage  . Asthma Mother   . Alcohol abuse Mother   . Kidney disease Mother   . Hypertension Mother   . Cirrhosis Mother   . Hypertension Sister   . Colon cancer Neg Hx     Past Medical History:  Diagnosis Date  . Anemia   . Arthritis   . GERD (gastroesophageal reflux disease)   . Hypertension     Past Surgical History:  Procedure Laterality Date  . BUBBLE STUDY  03/07/2019   Procedure: BUBBLE STUDY;  Surgeon: Jake Bathe, MD;  Location: [facility] ENDOSCOPY;  Service: Cardiovascular;;  . CESAREAN SECTION  08/1982  . LOOP RECORDER INSERTION N/A 03/07/2019   Procedure: LOOP RECORDER INSERTION;  Surgeon: Hillis Range, MD;  Location: [facility] INVASIVE CV LAB;  Service: Cardiovascular;  Laterality: N/A;  . TEE WITHOUT CARDIOVERSION N/A 03/07/2019   Procedure: TRANSESOPHAGEAL ECHOCARDIOGRAM (TEE);  Surgeon: Jake Bathe, MD;  Location: [facility] ENDOSCOPY;  Service: Cardiovascular;  Laterality: N/A;  . TOTAL  KNEE ARTHROPLASTY Left 08/13/2012   Dr Sherlean Foot  . 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 TUBAL LIGATION  08/1982,01/1985     Current Outpatient Medications on File Prior to Visit  Medication Sig Dispense Refill  . acetaminophen (TYLENOL) 500 MG tablet Take 1,000 mg by mouth every 6 (six) hours as needed for mild pain.    Marland Kitchen aspirin EC 81 MG EC tablet Take 1 tablet (81 mg total) by mouth daily.    Marland Kitchen atorvastatin (LIPITOR) 80 MG tablet Take 1 tablet (80 mg total) by mouth daily. 30 tablet 2  . hydrochlorothiazide (HYDRODIURIL) 25 MG tablet Take 1 tablet (25 mg total) by mouth daily. 30 tablet 2  . lisinopril (ZESTRIL) 20 MG tablet Take 1 tablet (20 mg total) by mouth daily. 30 tablet 2   No current facility-administered medications on file prior to visit.    Allergies  Allergen Reactions  . Shellfish Allergy Anaphylaxis    Physical exam:  Today's Vitals   04/17/19 0949  BP: (!) 159/90  Pulse: 68  Temp: 97.6 F (36.4 C)  Weight: 124 lb (56.2 kg)  Height: 5\' 1"  (1.549 m)   Body mass index is 23.43 kg/m.   Wt Readings from Last 3 Encounters:  04/17/19 124 lb (56.2 kg)  04/09/19 119 lb 9.6 oz (54.3 kg)  03/06/19 134 lb (60.8 kg)     Ht Readings from Last 3 Encounters:  04/17/19 5\' 1"  (1.549 m)  04/09/19 5' (1.524 m)  03/06/19 5' (1.524 m)      General: The patient is awake, alert and appears not in acute distress. The patient is well groomed. Head: Normocephalic, atraumatic. Neck is supple. Mallampati 3,  neck circumference:13 inches . Nasal airflow patent.  Retrognathia.  Toothless.   Cardiovascular:  Regular rate and cardiac rhythm by pulse,  without distended neck veins. Respiratory: Lungs are clear to auscultation.  Skin:  Without evidence of ankle edema, or rash. Trunk: The patient's posture is erect.   Neurologic exam : The patient is  awake and alert, oriented to place and time.   Memory subjective described as intact.  Attention span & concentration ability appears normal.  Speech is fluent,  with mld  dysarthria, dysphonia, but not aphasia.  Mood and affect are depressed.    Cranial nerves: no loss of smell or taste reported  Pupils are equal and briskly reactive to light. Funduscopic exam deferred, cataract. .  Extraocular movements in vertical and horizontal planes were intact and without nystagmus. No Diplopia. Visual fields by finger perimetry are intact. Hearing was intact to soft voice and finger rubbing.    Facial sensation intact to fine touch.  Facial motor strength is symmetric and tongue and uvula move midline.  Neck ROM : rotation, tilt and flexion extension were normal for age and shoulder shrug was symmetrical.    Motor exam:  Symmetric bulk, tone and ROM.   Normal tone without cog- wheeling, but increased on the right - weaker grip strength on the dominant right. .   Sensory:  pinprick and vibration were less felt on the right side.  Proprioception - pronator drift on the right side.   Coordination: Rapid alternating movements in the fingers/hands were of normal speed.  The Finger-to-nose maneuver was intact without evidence of ataxia, dysmetria or tremor.  Gait and station: Patient could rise unassisted from a seated position, walked without assistive device.  Stance is of normal width/ base and the patient turned with 3 steps.  Toe and heel walk were deferred. The patient  was able to stand on her tip toes.  Deep tendon reflexes: in the  upper and lower right extremities were brisk- almost hyperreflexic. Babinski response was up-going on the right.       I appreciate the opportunity to meet Renee Rodriguez today, course sleep pattern and sleep quality have taken a turn for the worse since she suffered her stroke.  She had a very detailed cardiac evaluation in search of the source of her embolic  stroke and her loop recorder was inserted on 7 January but so far has not given any signal of atrial fibrillation or other explanation for the stroke.  A TEE was done on the same day 07 March 2019 a bubble study and her medication list was reviewed.  She is on aspirin, Lipitor, hydrochlorothiazide, Zestril and takes Tylenol as needed.   Renee Rodriguez  has quit smoking again, she is awake and fully alert but she appears very depressed.  There is echocardiogram showed an EF between 60 and 65%, her MRI of the brain from the January 6 study showed a small acute lacunar infarct actually multiple lacunar infarcts in the left basal ganglia chronic small vessel disease and prominent chronic lacunar infarcts were already noted.  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  Hypoplastic vertebral artery segment, atherosclerotic changes in the carotid bifurcation bilaterally but no significant stenosis.  Some degenerative changes in the cervical spine were noted.  The patient had a right thalamic infarct that appears chronic.   She has recovered fairly well . she was offered to enroll into the [place] trial with Renee Rodriguez who follows her, and she has maintained on baby aspirin 81 mg and started on atorvastatin 80 mg.  During hospitalization it was noted that she had irregular sleep-related breathing so we are going to order a sleep study for her.   After spending a total time of 45 minutes face to face and additional time for physical and neurologic examination, review of laboratory studies,  personal review of imaging studies, reports and results of other testing and review of referral information / records as far as provided in visit, I have established the following assessments:  My Plan is to proceed with:  1) HST a or PSG , establishing possible OSA diagnosis.     I would like to thank Vladimir Crofts, FNP and Vladimir Crofts, Fnp [facility] [address] [place],  [place]  [address] for allowing me to meet with and to take care of this pleasant patient.   In short, Renee Rodriguez is presenting with a cryptogenic stroke.   Follow up will be  through our Renee within 2-3  month. CC Dr. Pearlean Brownie, MD   Electronically signed by: Melvyn Novas, MD 04/17/2019 10:13 AM  [name] and [facility] Board certified by The [employer] of Sleep Medicine and Diplomate of the [employer] of Sleep Medicine. Board certified In Neurology through the ABPN, Fellow of the [employer] of Neurology. Medical Director of [facility].

## 2019-04-22 ENCOUNTER — Other Ambulatory Visit: Payer: Self-pay | Admitting: *Deleted

## 2019-04-22 ENCOUNTER — Encounter: Payer: Self-pay | Admitting: *Deleted

## 2019-04-22 NOTE — Patient Outreach (Signed)
[facility]) Care Management  04/22/2019  DAVIAN WOLLENBERG 01-06-1951 [id number]   CSW made an initial attempt to try and contact patient today to perform the initial phone assessment, as well as assess and assist with social work needs and services, without success.  A HIPAA compliant message was left for patient on voicemail.  CSW is currently awaiting a return call.  CSW will make a second outreach attempt within the next 3-4 business days, if a return call is not received from patient in the meantime.  CSW will also mail an Outreach Letter to patient's home requesting that patient contact CSW if patient is interested in receiving social work services through CSW with Chief Executive Officer.  Danford Bad, BSW, MSW, LCSW  Licensed Restaurant manager, fast food [facility]  Mailing [place] [address] [place], [place] [address] Physical Address[address], [place], [place] [address] Toll Free Main # [phone] Fax # [phone] Cell # [phone]  Office # [phone] [email or web address]

## 2019-04-25 ENCOUNTER — Other Ambulatory Visit: Payer: Self-pay | Admitting: *Deleted

## 2019-04-25 NOTE — Patient Outreach (Signed)
[facility]) Care Management  04/25/2019  KJIRSTEN BLOODGOOD February 03, 1951 [id number]   CSW made a second attempt to try and contact patient today to perform phone assessment, as well as assess and assist with social work needs and services, without success.  A HIPAA compliant message was left for patient on voicemail.  CSW continues to await a return call.  CSW will make a third and final outreach attempt within the next 3-4 business days, if a return call is not received from patient in the meantime.  CSW will then proceed with case closure if a return call is not received from patient with a total of 10 business days, as required number of phone attempts will have been made and outreach letter mailed.   Danford Bad, BSW, MSW, LCSW  Licensed Restaurant manager, fast food Health System  Mailing [place] [address] [place], [place] [address] Physical Address[address], [place], [place] [address] Toll Free Main # [phone] Fax # [phone] Cell # [phone]  Office # [phone] [email or web address]

## 2019-04-30 ENCOUNTER — Other Ambulatory Visit: Payer: Self-pay | Admitting: *Deleted

## 2019-04-30 NOTE — Patient Outreach (Addendum)
[facility]) Care Management  04/30/2019  Renee Rodriguez [birth year] [id number]    CSW made a third and final attempt to try and contact patient today to perform the initial phone assessment, as well as assess and assist with social work needs and services, without success.  A HIPAA compliant message was left for patient on voicemail.  CSW is currently awaiting a return call.  CSW will proceed with case closure in the next four business days, if a return call is not received from patient in the meantime, as required number of phone attempts have been made and an outreach letter was mailed to patient's home allowing 10 business days for a response if patient was interested in receiving social work services through CSW with Chief Executive Officer.  CSW will encourage patient's Telephonic RNCM, Elmer Picker, also with [facility] Care Management, to have patient contact CSW directly if Renee Rodriguez is successful in her outreach attempts to patient.  Danford Bad, BSW, MSW, LCSW  Licensed Restaurant manager, fast food Health System  Mailing [place] [address] [place], [place] [address] Physical Address[address], [place], [place] [address] Toll Free Main # [phone] Fax # [phone] Cell # [phone]  Office # [phone] [email or web address]

## 2019-05-02 ENCOUNTER — Other Ambulatory Visit: Payer: Self-pay

## 2019-05-02 ENCOUNTER — Ambulatory Visit (INDEPENDENT_AMBULATORY_CARE_PROVIDER_SITE_OTHER): Payer: Medicare Other | Admitting: Neurology

## 2019-05-02 DIAGNOSIS — G478 Other sleep disorders: Secondary | ICD-10-CM | POA: Diagnosis not present

## 2019-05-02 DIAGNOSIS — F329 Major depressive disorder, single episode, unspecified: Secondary | ICD-10-CM

## 2019-05-02 DIAGNOSIS — I10 Essential (primary) hypertension: Secondary | ICD-10-CM

## 2019-05-02 DIAGNOSIS — I63412 Cerebral infarction due to embolism of left middle cerebral artery: Secondary | ICD-10-CM

## 2019-05-02 DIAGNOSIS — R29818 Other symptoms and signs involving the nervous system: Secondary | ICD-10-CM

## 2019-05-02 DIAGNOSIS — I639 Cerebral infarction, unspecified: Secondary | ICD-10-CM

## 2019-05-06 ENCOUNTER — Other Ambulatory Visit: Payer: Self-pay | Admitting: *Deleted

## 2019-05-06 ENCOUNTER — Encounter: Payer: Self-pay | Admitting: *Deleted

## 2019-05-06 ENCOUNTER — Other Ambulatory Visit: Payer: Self-pay | Admitting: Internal Medicine

## 2019-05-06 DIAGNOSIS — E2839 Other primary ovarian failure: Secondary | ICD-10-CM

## 2019-05-06 NOTE — Patient Outreach (Signed)
[facility]) Care Management  05/06/2019  Renee Rodriguez 1950/10/11 [id number]    CSW will perform a case closure on patient, due to inability to establish initial phone contact with patient, despite required number of phone attempts made and outreach letter mailed to patient's home, allowing 10 business days for a response if patient was interested in receiving social work services and resources through CSW with Chief Executive Officer.  CSW will notify patient's Telephonic RNCM, and individual placing initial referral, also with [facility] Care Management, Renee Rodriguez of CSW's plans to close patient's case.  CSW will fax an update to patient's Primary Care Physician, Renee Rodriguez to ensure that they are aware of CSW's involvement with patient's plan of care.   Renee Rodriguez, BSW, MSW, LCSW  Licensed Restaurant manager, fast food Health System  Mailing [place] [address] [place], [place] [address] Physical Address[address], [place], [place] [address] Toll Free Main # [phone] Fax # [phone] Cell # [phone]  Office # [phone] [email or web address]

## 2019-05-09 ENCOUNTER — Ambulatory Visit (INDEPENDENT_AMBULATORY_CARE_PROVIDER_SITE_OTHER): Payer: Medicare Other | Admitting: *Deleted

## 2019-05-09 DIAGNOSIS — I63412 Cerebral infarction due to embolism of left middle cerebral artery: Secondary | ICD-10-CM

## 2019-05-10 ENCOUNTER — Other Ambulatory Visit: Payer: Self-pay

## 2019-05-10 LAB — CUP PACEART REMOTE DEVICE CHECK
Date Time Interrogation Session: 20210311050658
Implantable Pulse Generator Implant Date: 20210107

## 2019-05-10 NOTE — Patient Outreach (Signed)
Incoming call to [facility] Care Management per unsuccessful letter patient received.  CMA explained prior notes from [facility] RN and LCSW regarding multiple attempts to reach patient.  CMA verified patients telephone number and will send LCSW in basket regarding patient now expecting call.  Call documented for future reference.  Baruch Gouty [facility] Management Assistant [phone]

## 2019-05-10 NOTE — Progress Notes (Signed)
ILR Remote 

## 2019-05-17 NOTE — Progress Notes (Signed)
IMPRESSION:   1. No evidence of any form of sleep disordered breathing, no  Sleep Apnea(OSA)  2. Mild Periodic Limb Movements.  3. No Snoring, no hypoxemia and normal SR- EKG    RECOMMENDATIONS:   There is not evidence of an organic sleep disorder.  Referring provider/ PCP: Please  consider treatment for Depression related Insomnia through  behavioral health.  No need to follow in the Sleep Clinic.  I will inform Dr. Pearlean Brownie and NP Lexine Baton of these results.

## 2019-05-17 NOTE — Procedures (Signed)
A split night study protocol was not invoked as no Sleep Apnea was noted. PATIENT'S NAME:  Renee, Rodriguez DOB:      02-21-1951      MR#:    [id number]     DATE OF RECORDING: 05/02/2019  MR REFERRING M.D.:  Dr Pearlean Brownie, cc: Marcy Panning Day, FNP [facility]. Study Performed:   Baseline Polysomnogram HISTORY:  -Hospital follow up visit with Stroke team, Dr. Pearlean Brownie / Ihor Austin. seen on 04-09-2019 in referral foe completion of work-up for cryptogenic stroke. ( Left basal ganglion stroke, ischemic). She worked at [employer]. She smoked at the time of the stroke. The patient has mild residual dysphonia and appears severely depressed- fragmented sleep, no sleep routines, no regimen, and she has a history of Arthritis, poorly controlled HTN, iron deficiency Anemia and GERD. The patient endorsed the Epworth Sleepiness Scale at 17 points.  FSS at 53/63 points. The patient's weight 124 pounds with a height of 61 (inches), resulting in a BMI of 23.3 kg/m2. The patient's neck circumference measured 13 inches.  CURRENT MEDICATIONS: Tylenol, Aspirin, Lipitor, HCTZ, Zestril   PROCEDURE:  This is a multichannel digital polysomnogram utilizing the Somnostar 11.2 system.  Electrodes and sensors were applied and monitored per AASM Specifications.   EEG, EOG, Chin and Limb EMG, were sampled at 200 Hz.  ECG, Snore and Nasal Pressure, Thermal Airflow, Respiratory Effort, CPAP Flow and Pressure, Oximetry was sampled at 50 Hz. Digital video and audio were recorded.      BASELINE STUDY: Lights Out was at 20:34 and Lights On at 04:52.  Total recording time (TRT) was 498.5 minutes, with a total sleep time (TST) of 284.5 minutes.   The patient's sleep latency was 94 minutes.  REM latency was 134 minutes.  The sleep efficiency was 57.1 %.     SLEEP ARCHITECTURE: WASO (Wake after sleep onset) was 130.5 minutes.  There were 17 minutes in Stage N1, 160 minutes Stage N2, 64 minutes Stage N3 and 43.5 minutes in  Stage REM.  The percentage of Stage N1 was 6.%, Stage N2 was 56.2%, Stage N3 was 22.5% and Stage R (REM sleep) was 15.3%.    RESPIRATORY ANALYSIS:  There were a total of 0 respiratory events:  0 obstructive apneas, 0 central apneas and 0 mixed apneas with a total of 0 apneas and an apnea index (AI) of 0 /hour. There were 0 hypopneas with a hypopnea index of 0 /hour. The patient also had 0 respiratory event related arousals (RERAs).      The total APNEA/HYPOPNEA INDEX (AHI) was 0/hour.  0 events occurred in REM sleep and 0 events in NREM. The REM AHI was  0.0 /hour, versus a non-REM AHI of 0. The patient spent 92.5 minutes of total sleep time in the supine position and 192 minutes in non-supine.. The supine AHI was 0.0 versus a non-supine AHI of 0.0.  OXYGEN SATURATION & C02:  The Wake baseline 02 saturation was 96%, with the lowest being 94%. Time spent below 89% saturation equaled 0 minutes.  The arousals were noted as: 59 were spontaneous, 1 was associated with PLMs, and none were associated with respiratory events. The patient had a total of 6 Periodic Limb Movements.  The Periodic Limb Movement (PLM) index was 1.3 and the PLM Arousal index was 0.2/hour. Audio and video analysis did not show any abnormal or unusual movements, behaviors, phonations or vocalizations.   EKG was in keeping with normal sinus rhythm (NSR).  Post-study,  the patient indicated that sleep was the same as usual.    IMPRESSION:  1. No evidence of any form of sleep disordered breathing, no Sleep Apnea(OSA) 2. Mild Periodic Limb Movements.  3. No Snoring, no hypoxemia and no   4. normal SR- EKG   RECOMMENDATIONS:  There is not evidence of an organic sleep disorder. Please consider treatment for Depression related Insomnia through behavioral health. Ni need to follow in the Sleep Clinic. I will Inform Dr. Leonie Man and NP Darden Dates of these results.   I certify that I have reviewed the entire raw data recording prior to the  issuance of this report in accordance with the Standards of Accreditation of the American Academy of Sleep Medicine (AASM)     Larey Seat, MD Diplomat, American Board of Psychiatry and Neurology  Diplomat, American Board of Sleep Medicine Market researcher, [place] [facility] [employer]

## 2019-05-20 ENCOUNTER — Encounter: Payer: Self-pay | Admitting: Neurology

## 2019-05-20 ENCOUNTER — Telehealth: Payer: Self-pay | Admitting: Neurology

## 2019-05-20 NOTE — Telephone Encounter (Signed)
Called the pt to review the sleep study results. There was no answer. LVM informing the patient Dr Vickey Huger reviewed and didn't see any sleep disordered breathing or sleep apnea. Advised I would send a more detailed message on mychart and informed the patient to reply to that or call the office with any questions.

## 2019-05-20 NOTE — Telephone Encounter (Signed)
-----   Message from Melvyn Novas, MD sent at 05/17/2019  2:52 PM EDT ----- IMPRESSION:   1. No evidence of any form of sleep disordered breathing, no  Sleep Apnea(OSA)  2. Mild Periodic Limb Movements.  3. No Snoring, no hypoxemia and normal SR- EKG    RECOMMENDATIONS:   There is not evidence of an organic sleep disorder.  Referring provider/ PCP: Please  consider treatment for Depression related Insomnia through  behavioral health.  No need to follow in the Sleep Clinic.  I will inform Dr. Pearlean Brownie and NP Lexine Baton of these results.

## 2019-05-22 ENCOUNTER — Other Ambulatory Visit: Payer: Self-pay

## 2019-05-22 ENCOUNTER — Ambulatory Visit
Admission: RE | Admit: 2019-05-22 | Discharge: 2019-05-22 | Disposition: A | Discharge status: Home / Self Care | Payer: Medicare Other | Source: Ambulatory Visit | Attending: Internal Medicine | Admitting: Internal Medicine

## 2019-05-22 DIAGNOSIS — E2839 Other primary ovarian failure: Secondary | ICD-10-CM

## 2019-05-30 NOTE — Telephone Encounter (Signed)
Called the patient and was able to review the sleep study results with her in detail. Advised the patient there was no concerns related to sleep disordered breathing. No evidence of apnea, hypoxemia or heart rate concerns. Advised that insomnia remains a concern she would discuss with PCP about a referral to cognitive behavior therapy for treatment of insomnia. Encouraged the patient to practice good sleep hygiene habits and reviewed examples of that. Patient states that she is taking her zoloft 25 mg and states that she feels that is working well. Patient was appreciative for the call.

## 2019-06-05 ENCOUNTER — Telehealth: Payer: Self-pay

## 2019-06-05 NOTE — Telephone Encounter (Signed)
The pt states she is out of 3 of her medications. She would like for Dr. Johney Frame nurse to give her a call as soon as she get a chance.

## 2019-06-05 NOTE — Telephone Encounter (Signed)
The patient's primary care physician should be able to refill his cholesterol and blood pressure medications.

## 2019-06-06 ENCOUNTER — Other Ambulatory Visit: Payer: Self-pay

## 2019-06-06 NOTE — Patient Outreach (Signed)
Telephone outreach to patient to obtain mRS was successfully completed. MRS=1  Jousha Schwandt [facility]-Care Management Assistant [phone] 

## 2019-06-06 NOTE — Telephone Encounter (Signed)
Left detailed message for Pt per DPR.  Advised Dr. Johney Frame could not fill her medications because he will only be following her device remotely.  This nurse called Pt's PCP.  Asked if they would fill Pt's medications.  They will send to care team and fill Pt's medications.

## 2019-06-10 ENCOUNTER — Ambulatory Visit (INDEPENDENT_AMBULATORY_CARE_PROVIDER_SITE_OTHER): Payer: Medicare Other | Admitting: *Deleted

## 2019-06-10 DIAGNOSIS — I63412 Cerebral infarction due to embolism of left middle cerebral artery: Secondary | ICD-10-CM

## 2019-06-10 LAB — CUP PACEART REMOTE DEVICE CHECK
Date Time Interrogation Session: 20210410230308
Implantable Pulse Generator Implant Date: 20210107

## 2019-06-10 NOTE — Progress Notes (Signed)
ILR Remote 

## 2019-07-11 LAB — CUP PACEART REMOTE DEVICE CHECK
Date Time Interrogation Session: 20210511230151
Implantable Pulse Generator Implant Date: 20210107

## 2019-07-15 ENCOUNTER — Ambulatory Visit (INDEPENDENT_AMBULATORY_CARE_PROVIDER_SITE_OTHER): Payer: Medicare Other | Admitting: *Deleted

## 2019-07-15 DIAGNOSIS — I639 Cerebral infarction, unspecified: Secondary | ICD-10-CM

## 2019-07-16 NOTE — Progress Notes (Signed)
Carelink Summary Report / Loop Recorder 

## 2019-08-08 ENCOUNTER — Ambulatory Visit: Payer: Medicare Other | Admitting: Adult Health

## 2019-08-08 NOTE — Progress Notes (Deleted)
[name] [address] [place]. [place] [address] [phone]       STROKE FOLLOW UP NOTE  [name] Date of Birth:  19-Feb-1951 Medical Record Number:  [id number]   Reason for Referral: stroke follow up    CHIEF COMPLAINT:  No chief complaint on file.   HPI:  Today, 08/08/2019, Renee Rodriguez returns for follow-up regarding cryptogenic left BG/CR stroke in 03/2019.  She has been stable from a stroke standpoint with residual ***.  Denies new or worsening stroke/TIA symptoms.  Continues on aspirin and atorvastatin for secondary stroke prevention.  Blood pressure today ***.  Loop recorder is not shown atrial fibrillation thus far.  She was evaluated by Dr. Vickey Huger for possible sleep apnea undergoing split-night study on 05/02/2019 which did not show any evidence of sleep apnea.  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  [place] trial ***. No concerns at this time.   History provided for reference purposes only Initial visit 04/09/2019 JM: [name] is a 69 year old female who is being seen today for hospital follow-up.  She has been recovering well from a stroke standpoint with only mild left hand decreased fine motor control and occasional word finding difficulty.  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  She is questioning if she can return at this time.  She has completed 3 weeks DAPT and continues on aspirin alone without bleeding or bruising.  Continues on atorvastatin 80 mg daily and does complain of mild left calf pain and left side pain. She states initially feeling on right side cramping but now left. Cramping does not limit daily activity or functioning. She does have follow up with PCP in the near future and per patient, will have lab work obtained including lipid panel. Blood pressure today satisfactory at 122/80.  Loop recorder has not shown atrial fibrillation thus far. Discussion regarding suspected sleep apnea during admission. Does admit to morning headaches, day time fatigue and insomnia. She has not previously underwent sleep study. She denies complete tobacco cessation. She continues to use THC occasionally but has been trying to decrease. Typically uses to help insomnia and anxiety. Loop recorder has not shown atrial fibrillation thus far. Denies new or worsening stroke/TIA symptoms.   Stroke admission 03/05/2019: ReneeRaevin C Rodriguez a 69 y.o.femalewith history of hypertension, GERD, arthritis and anemiawho presented on 03/05/2019 with R sided weakness and inability to get her words out. Evaluated by stroke team and Dr. Pearlean Brownie with stroke work-up revealing left basal ganglia and corona radiata infarcts as evidenced on MRI embolic pattern secondary to unknown source. NIHSS score 6 and CT head no evidence of hemorrhage therefore received tPA 03/05/2019 at 0840. In addition to acute infarcts, MRI also showed small vessel disease, old right thalamic lacune, microhemorrhage and right VA cervical artery occlusion.  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  Repeat CT head 24 hours post TPA no evidence of hemorrhage with evolving left basal ganglia and corona radiata infarct.  2D echo unremarkable.  TEE negative for cardiac source of embolus.  Loop recorder placed to evaluate for atrial fibrillation as etiology of recent stroke.  Recommended DAPT for 3 weeks and aspirin alone as she is not previously on antithrombotic.  History of HTN elevated upon presentation and treated  with Cleviprex when noncompliance of prior home dose antihypertensive and recommended long-term pedal normotensive range.  LDL 200 previously prescribed atorvastatin 20 mg daily but reported noncompliance and recommended initiating  atorvastatin 80 mg daily.  No evidence or history of DM with A1c 5.4.  UDS positive for THC.  Other stroke risk factors include advanced age, tobacco use, EtOH use, THC use, family history of stroke and possible OSA.  Discharged home in stable condition recommendation of outpatient therapy.  Stroke:L basal ganglia and corona radiatainfarct embolic secondary to unknownsource  Code Stroke CT head No acute abnormality. Small vessel disease.Old R thalamic infarct.ASPECTS 10.   CTA head & neckR VA origin occlusion w/ hypoplastic R V4. Mild ICA bifurcation atherosclerosis. Mild small vessel disease. Degenerative CS.  MRISmall posterior lentiform nucleus and corona radiata infarct >2cm in size. Small vessel disease. Old R thalamic lacune and micro hemorrhage. R VA cervical artery occlusion.   Repeat CT head 24h no hemorrhage. evolving L basal ganglia and corona radiata infarct  2D EchoEF60-65%. No source of embolus. RA dilated.  TEE neg for embolic source.  implantable loop recorder placed 1/7 to evaluate for atrial fibrillation as etiology of stroke([place])  We will not perform a TCD bubble given age > 32,  LDL200  HgbA1c5.4  UDS positive THC  No antithromboticprior to admission, started ib aspirin 81 mg and plavix 75 mg daily x 3 weeks, then aspirin alone.   Therapy recommendations:OP OT, no PT, no SLP  Disposition:return home      ROS:   14 system review of systems performed and negative with exception of fatigue, insomnia  PMH:  Past Medical History:  Diagnosis Date  . Anemia   . Arthritis   . GERD (gastroesophageal reflux disease)   . Hypertension     PSH:  Past Surgical History:  Procedure Laterality Date  . BUBBLE STUDY  03/07/2019   Procedure: BUBBLE STUDY;  Surgeon: Jake Bathe, MD;  Location: [facility] ENDOSCOPY;  Service: Cardiovascular;;  . CESAREAN SECTION  08/1982  . LOOP RECORDER INSERTION N/A 03/07/2019   Procedure: LOOP RECORDER  INSERTION;  Surgeon: Hillis Range, MD;  Location: [facility] INVASIVE CV LAB;  Service: Cardiovascular;  Laterality: N/A;  . TEE WITHOUT CARDIOVERSION N/A 03/07/2019   Procedure: TRANSESOPHAGEAL ECHOCARDIOGRAM (TEE);  Surgeon: Jake Bathe, MD;  Location: [facility] ENDOSCOPY;  Service: Cardiovascular;  Laterality: N/A;  . 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 TUBAL LIGATION  08/1982,01/1985    Social History:  Social History   Socioeconomic History  . Marital status: Legally Separated    Spouse name: Not on file  . Number of children: Not on file  . Years of education: Not on file  . Highest education level: Not on file  Occupational History  . Occupation: Building control surveyor  Tobacco Use  . Smoking status: Former Smoker    Types: Cigarettes    Quit date: 03/04/2019    Years since quitting: 0.4  . Smokeless tobacco: Never Used  Substance and Sexual Activity  . Alcohol use: Yes    Alcohol/week: 3.0 standard drinks    Types: 3 Standard drinks or equivalent per week    Comment: occasionally beer  . Drug use: No  . Sexual activity: Not on file  Other Topics Concern  . Not on file  Social  History Narrative  . Not on file   Social Determinants of Health   Financial Resource Strain:   . Difficulty of Paying Living Expenses:   Food Insecurity:   . Worried About Charity fundraiser in the Last Year:   . Arboriculturist in the Last Year:   Transportation Needs:   . Film/video editor (Medical):   Marland Kitchen Lack of Transportation (Non-Medical):   Physical Activity:   . Days of Exercise per Week:   . Minutes of Exercise per Session:   Stress:   . Feeling of Stress :   Social Connections:   . Frequency of Communication with Friends and Family:   . Frequency of Social Gatherings with Friends and Family:   .  Attends Religious Services:   . Active Member of Clubs or Organizations:   . Attends Archivist Meetings:   Marland Kitchen Marital Status:   Intimate Partner Violence:   . Fear of Current or Ex-Partner:   . Emotionally Abused:   Marland Kitchen Physically Abused:   . Sexually Abused:     Family History:  Family History  Problem Relation Age of Onset  . Stroke Mother        Cerebral hemorrhage  . Asthma Mother   . Alcohol abuse Mother   . Kidney disease Mother   . Hypertension Mother   . Cirrhosis Mother   . Hypertension Sister   . Colon cancer Neg Hx     Medications:   Current Outpatient Medications on File Prior to Visit  Medication Sig Dispense Refill  . acetaminophen (TYLENOL) 500 MG tablet Take 1,000 mg by mouth every 6 (six) hours as needed for mild pain.    Marland Kitchen aspirin EC 81 MG EC tablet Take 1 tablet (81 mg total) by mouth daily.    Marland Kitchen atorvastatin (LIPITOR) 80 MG tablet Take 1 tablet (80 mg total) by mouth daily. 30 tablet 2  . hydrochlorothiazide (HYDRODIURIL) 25 MG tablet Take 1 tablet (25 mg total) by mouth daily. 30 tablet 2  . lisinopril (ZESTRIL) 20 MG tablet Take 1 tablet (20 mg total) by mouth daily. 30 tablet 2  . sertraline (ZOLOFT) 25 MG tablet Take 1 tablet (25 mg total) by mouth daily. (Patient not taking: Reported on 04/17/2019) 30 tablet 5   No current facility-administered medications on file prior to visit.    Allergies:   Allergies  Allergen Reactions  . Shellfish Allergy Anaphylaxis     Physical Exam  There were no vitals filed for this visit. There is no height or weight on file to calculate BMI. No exam data present  General: well developed, well nourished, pleasant elderly AA female, seated, in no evident distress Head: head normocephalic and atraumatic.   Neck: supple with no carotid or supraclavicular bruits Cardiovascular: regular rate and rhythm, no murmurs Musculoskeletal: no deformity Skin:  no rash/petichiae Vascular:  Normal pulses all  extremities   Neurologic Exam Mental Status: Awake and fully alert. Occasional word finding difficulty/hesitancy. Oriented to place and time. Recent and remote memory intact. Attention span, concentration and fund of knowledge appropriate. Mood and affect appropriate.  Cranial Nerves: Pupils equal, briskly reactive to light. Extraocular movements full without nystagmus. Visual fields full to confrontation. Hearing intact. Facial sensation intact. Face, tongue, palate moves normally and symmetrically.  Motor: Normal bulk and tone. Normal strength in all tested extremity muscles except mild decreased left hand dexterity. Sensory.: intact to touch , pinprick , position and vibratory sensation.  Coordination:  Rapid alternating movements normal in all extremities except mildly decreased left hand. Finger-to-nose and heel-to-shin performed accurately bilaterally. Gait and Station: Arises from chair without difficulty. Stance is normal. Gait demonstrates normal stride length and balance Reflexes: 1+ and symmetric. Toes downgoing.        ASSESSMENT: HEAVIN SEBREE is a 69 y.o. year old female presented with right-sided weakness and expressive aphasia on 03/05/2019 with stroke work-up revealing left basal ganglia and corona radiata infarcts s/p TPA secondary to unknown source.  Loop recorder placed for long-term monitoring of possible atrial fibrillation and stroke etiology.  Vascular risk factors include HTN, HLD, tobacco use, THC use, EtOH use, medication noncompliance and suspected OSA. She has been recovering well from stroke standpoint with residual occasional word finding difficulty and slightly decreased left hand dexterity    PLAN:  1. Cryptogenic stroke: Continue aspirin 81 mg daily  and atorvastatin 80 mg daily for secondary stroke prevention. Maintain strict control of hypertension with blood pressure goal below 130/90, diabetes with hemoglobin A1c goal below 6.5% and cholesterol with LDL  cholesterol (bad cholesterol) goal below 70 mg/dL.  I also advised the patient to eat a healthy diet with plenty of whole grains, cereals, fruits and vegetables, exercise regularly with at least 30 minutes of continuous activity daily and maintain ideal body weight.  Continue to monitor loop recorder for possible atrial fibrillation 2. HTN: Stable.  Continue to follow with PCP for monitoring and management 3. HLD: Discussion regarding possible statin myalgias with high intensity statin.  She would like to trial use of co-Q10 to see if this helps mild cramps and plans on following up with PCP in the near future for repeat lab work.  Advised to continue current treatment regimen along with continued follow-up with PCP for future prescribing and monitoring of lipid panel 4. Suspected OSA: referral placed to [facility] sleep clinic for evaluation of possible sleep apnea 5. Tobacco use: complete cessation and congratulated and encouraged ongoing use  6. THC use: encouraged complete cessation for secondary stroke prevention     Follow up in 4 months or call earlier if needed   Greater than 50% of time during this 45 minute visit was spent on counseling, explanation of diagnosis of cryptogenic stroke, reviewing risk factor management of HTN, HLD, suspected OSA, history of tobacco use and THC use, discussion regarding importance of medication compliance, planning of further management along with potential future management, and discussion with patient and answering all questions.    Ihor Austin, AGNP-BC  [facility] [address] [place], [place] [address]  Phone [phone] Fax [phone] Note: This document was prepared with digital dictation and possible smart phrase technology. Any transcriptional errors that result from this process are unintentional.

## 2019-08-19 ENCOUNTER — Ambulatory Visit (INDEPENDENT_AMBULATORY_CARE_PROVIDER_SITE_OTHER): Payer: Medicare Other | Admitting: *Deleted

## 2019-08-19 DIAGNOSIS — I639 Cerebral infarction, unspecified: Secondary | ICD-10-CM

## 2019-08-19 LAB — CUP PACEART REMOTE DEVICE CHECK
Date Time Interrogation Session: 20210620230502
Implantable Pulse Generator Implant Date: 20210107

## 2019-08-20 ENCOUNTER — Ambulatory Visit: Payer: Medicare Other | Admitting: Adult Health

## 2019-08-20 NOTE — Progress Notes (Deleted)
[name] [address] [place]. [place] [address] [phone]       STROKE FOLLOW UP NOTE  [name] Date of Birth:  Mar 12, 1950 Medical Record Number:  [id number]   Reason for Referral: stroke follow up    CHIEF COMPLAINT:  No chief complaint on file.   HPI:  Today, 08/20/2019, Renee Rodriguez returns for follow-up regarding cryptogenic left BG/CR stroke in 03/2019.  She has been stable from a stroke standpoint with residual ***.  Denies new or worsening stroke/TIA symptoms.  Continues on aspirin and atorvastatin for secondary stroke prevention.  Blood pressure today ***.  Loop recorder is not shown atrial fibrillation thus far.  She was evaluated by Dr. Brett Fairy for possible sleep apnea undergoing split-night study on 05/02/2019 which did not show any evidence of sleep apnea.  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  [place] trial ***. No concerns at this time.   History provided for reference purposes only Initial visit 04/09/2019 JM: Renee Rodriguez is a 69 year old female who is being seen today for hospital follow-up.  She has been recovering well from a stroke standpoint with only mild left hand decreased fine motor control and occasional word finding difficulty.  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  She is questioning if she can return at this time.  She has completed 3 weeks DAPT and continues on aspirin alone without bleeding or bruising.  Continues on atorvastatin 80 mg daily and does complain of mild left calf pain and left side pain. She states initially feeling on right side cramping but now left. Cramping does not limit daily activity or functioning. She does have follow up with PCP in the near future and per patient, will have lab work obtained including lipid panel. Blood pressure today satisfactory at 122/80.  Loop recorder has not shown atrial fibrillation thus far. Discussion regarding suspected sleep apnea during admission. Does admit to morning headaches, day time fatigue and insomnia. She has not previously underwent sleep study. She denies complete tobacco cessation. She continues to use THC occasionally but has been trying to decrease. Typically uses to help insomnia and anxiety. Loop recorder has not shown atrial fibrillation thus far. Denies new or worsening stroke/TIA symptoms.   Stroke admission 03/05/2019: Renee C Waldenis a 69 y.o.femalewith history of hypertension, GERD, arthritis and anemiawho presented on 03/05/2019 with R sided weakness and inability to get her words out. Evaluated by stroke team and Dr. Leonie Man with stroke work-up revealing left basal ganglia and corona radiata infarcts as evidenced on MRI embolic pattern secondary to unknown source. NIHSS score 6 and CT head no evidence of hemorrhage therefore received tPA 03/05/2019 at [place]. In addition to acute infarcts, MRI also showed small vessel disease, old right thalamic lacune, microhemorrhage and right VA cervical artery occlusion.  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  Repeat CT head 24 hours post TPA no evidence of hemorrhage with evolving left basal ganglia and corona radiata infarct.  2D echo unremarkable.  TEE negative for cardiac source of embolus.  Loop recorder placed to evaluate for atrial fibrillation as etiology of recent stroke.  Recommended DAPT for 3 weeks and aspirin alone as she is not previously on antithrombotic.  History of HTN elevated upon presentation and treated  with Cleviprex when noncompliance of prior home dose antihypertensive and recommended long-term pedal normotensive range.  LDL 200 previously prescribed atorvastatin 20 mg daily but reported noncompliance and recommended initiating  atorvastatin 80 mg daily.  No evidence or history of DM with A1c 5.4.  UDS positive for THC.  Other stroke risk factors include advanced age, tobacco use, EtOH use, THC use, family history of stroke and possible OSA.  Discharged home in stable condition recommendation of outpatient therapy.  Stroke:L basal ganglia and corona radiatainfarct embolic secondary to unknownsource  Code Stroke CT head No acute abnormality. Small vessel disease.Old R thalamic infarct.ASPECTS 10.   CTA head & neckR VA origin occlusion w/ hypoplastic R V4. Mild ICA bifurcation atherosclerosis. Mild small vessel disease. Degenerative CS.  MRISmall posterior lentiform nucleus and corona radiata infarct >2cm in size. Small vessel disease. Old R thalamic lacune and micro hemorrhage. R VA cervical artery occlusion.   Repeat CT head 24h no hemorrhage. evolving L basal ganglia and corona radiata infarct  2D EchoEF60-65%. No source of embolus. RA dilated.  TEE neg for embolic source.  implantable loop recorder placed 1/7 to evaluate for atrial fibrillation as etiology of stroke([place])  We will not perform a TCD bubble given age > 32,  LDL200  HgbA1c5.4  UDS positive THC  No antithromboticprior to admission, started ib aspirin 81 mg and plavix 75 mg daily x 3 weeks, then aspirin alone.   Therapy recommendations:OP OT, no PT, no SLP  Disposition:return home      ROS:   14 system review of systems performed and negative with exception of fatigue, insomnia  PMH:  Past Medical History:  Diagnosis Date  . Anemia   . Arthritis   . GERD (gastroesophageal reflux disease)   . Hypertension     PSH:  Past Surgical History:  Procedure Laterality Date  . BUBBLE STUDY  03/07/2019   Procedure: BUBBLE STUDY;  Surgeon: Jake Bathe, MD;  Location: [facility] ENDOSCOPY;  Service: Cardiovascular;;  . CESAREAN SECTION  08/1982  . LOOP RECORDER INSERTION N/A 03/07/2019   Procedure: LOOP RECORDER  INSERTION;  Surgeon: Hillis Range, MD;  Location: [facility] INVASIVE CV LAB;  Service: Cardiovascular;  Laterality: N/A;  . TEE WITHOUT CARDIOVERSION N/A 03/07/2019   Procedure: TRANSESOPHAGEAL ECHOCARDIOGRAM (TEE);  Surgeon: Jake Bathe, MD;  Location: [facility] ENDOSCOPY;  Service: Cardiovascular;  Laterality: N/A;  . 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 TUBAL LIGATION  08/1982,01/1985    Social History:  Social History   Socioeconomic History  . Marital status: Legally Separated    Spouse name: Not on file  . Number of children: Not on file  . Years of education: Not on file  . Highest education level: Not on file  Occupational History  . Occupation: Building control surveyor  Tobacco Use  . Smoking status: Former Smoker    Types: Cigarettes    Quit date: 03/04/2019    Years since quitting: 0.4  . Smokeless tobacco: Never Used  Substance and Sexual Activity  . Alcohol use: Yes    Alcohol/week: 3.0 standard drinks    Types: 3 Standard drinks or equivalent per week    Comment: occasionally beer  . Drug use: No  . Sexual activity: Not on file  Other Topics Concern  . Not on file  Social  History Narrative  . Not on file   Social Determinants of Health   Financial Resource Strain:   . Difficulty of Paying Living Expenses:   Food Insecurity:   . Worried About Charity fundraiser in the Last Year:   . Arboriculturist in the Last Year:   Transportation Needs:   . Film/video editor (Medical):   Marland Kitchen Lack of Transportation (Non-Medical):   Physical Activity:   . Days of Exercise per Week:   . Minutes of Exercise per Session:   Stress:   . Feeling of Stress :   Social Connections:   . Frequency of Communication with Friends and Family:   . Frequency of Social Gatherings with Friends and Family:   .  Attends Religious Services:   . Active Member of Clubs or Organizations:   . Attends Archivist Meetings:   Marland Kitchen Marital Status:   Intimate Partner Violence:   . Fear of Current or Ex-Partner:   . Emotionally Abused:   Marland Kitchen Physically Abused:   . Sexually Abused:     Family History:  Family History  Problem Relation Age of Onset  . Stroke Mother        Cerebral hemorrhage  . Asthma Mother   . Alcohol abuse Mother   . Kidney disease Mother   . Hypertension Mother   . Cirrhosis Mother   . Hypertension Sister   . Colon cancer Neg Hx     Medications:   Current Outpatient Medications on File Prior to Visit  Medication Sig Dispense Refill  . acetaminophen (TYLENOL) 500 MG tablet Take 1,000 mg by mouth every 6 (six) hours as needed for mild pain.    Marland Kitchen aspirin EC 81 MG EC tablet Take 1 tablet (81 mg total) by mouth daily.    Marland Kitchen atorvastatin (LIPITOR) 80 MG tablet Take 1 tablet (80 mg total) by mouth daily. 30 tablet 2  . hydrochlorothiazide (HYDRODIURIL) 25 MG tablet Take 1 tablet (25 mg total) by mouth daily. 30 tablet 2  . lisinopril (ZESTRIL) 20 MG tablet Take 1 tablet (20 mg total) by mouth daily. 30 tablet 2  . sertraline (ZOLOFT) 25 MG tablet Take 1 tablet (25 mg total) by mouth daily. (Patient not taking: Reported on 04/17/2019) 30 tablet 5   No current facility-administered medications on file prior to visit.    Allergies:   Allergies  Allergen Reactions  . Shellfish Allergy Anaphylaxis     Physical Exam  There were no vitals filed for this visit. There is no height or weight on file to calculate BMI. No exam data present  General: well developed, well nourished, pleasant elderly AA female, seated, in no evident distress Head: head normocephalic and atraumatic.   Neck: supple with no carotid or supraclavicular bruits Cardiovascular: regular rate and rhythm, no murmurs Musculoskeletal: no deformity Skin:  no rash/petichiae Vascular:  Normal pulses all  extremities   Neurologic Exam Mental Status: Awake and fully alert. Occasional word finding difficulty/hesitancy. Oriented to place and time. Recent and remote memory intact. Attention span, concentration and fund of knowledge appropriate. Mood and affect appropriate.  Cranial Nerves: Pupils equal, briskly reactive to light. Extraocular movements full without nystagmus. Visual fields full to confrontation. Hearing intact. Facial sensation intact. Face, tongue, palate moves normally and symmetrically.  Motor: Normal bulk and tone. Normal strength in all tested extremity muscles except mild decreased left hand dexterity. Sensory.: intact to touch , pinprick , position and vibratory sensation.  Coordination:  Rapid alternating movements normal in all extremities except mildly decreased left hand. Finger-to-nose and heel-to-shin performed accurately bilaterally. Gait and Station: Arises from chair without difficulty. Stance is normal. Gait demonstrates normal stride length and balance Reflexes: 1+ and symmetric. Toes downgoing.        ASSESSMENT: TONETTA NAPOLES is a 69 y.o. year old female presented with right-sided weakness and expressive aphasia on 03/05/2019 with stroke work-up revealing left basal ganglia and corona radiata infarcts s/p TPA secondary to unknown source.  Loop recorder placed for long-term monitoring of possible atrial fibrillation and stroke etiology.  Vascular risk factors include HTN, HLD, tobacco use, THC use, EtOH use, and hx of medication noncompliance.  Underwent sleep study which was negative for sleep apnea.  She has been recovering well from stroke standpoint with residual occasional word finding difficulty and slightly decreased left hand dexterity    PLAN:  1. Cryptogenic stroke: Continue aspirin 81 mg daily  and atorvastatin 80 mg daily for secondary stroke prevention. Maintain strict control of hypertension with blood pressure goal below 130/90, diabetes with  hemoglobin A1c goal below 6.5% and cholesterol with LDL cholesterol (bad cholesterol) goal below 70 mg/dL.  I also advised the patient to eat a healthy diet with plenty of whole grains, cereals, fruits and vegetables, exercise regularly with at least 30 minutes of continuous activity daily and maintain ideal body weight.  Continue to monitor loop recorder for possible atrial fibrillation 2. HTN: Stable.  Continue to follow with PCP for monitoring and management 3. HLD: Discussion regarding possible statin myalgias with high intensity statin.  She would like to trial use of co-Q10 to see if this helps mild cramps and plans on following up with PCP in the near future for repeat lab work.  Advised to continue current treatment regimen along with continued follow-up with PCP for future prescribing and monitoring of lipid panel 4. Tobacco use: complete cessation and congratulated and encouraged ongoing use  5. THC use: encouraged complete cessation for secondary stroke prevention   Follow up in 4 months or call earlier if needed   Greater than 50% of time during this 45 minute visit was spent on counseling, explanation of diagnosis of cryptogenic stroke, reviewing risk factor management of HTN, HLD, suspected OSA, history of tobacco use and THC use, discussion regarding importance of medication compliance, planning of further management along with potential future management, and discussion with patient and answering all questions.    Ihor Austin, AGNP-BC  [facility] [address] [place], [place] [address]  Phone [phone] Fax [phone] Note: This document was prepared with digital dictation and possible smart phrase technology. Any transcriptional errors that result from this process are unintentional.

## 2019-08-20 NOTE — Progress Notes (Signed)
Carelink Summary Report / Loop Recorder 

## 2019-08-21 ENCOUNTER — Telehealth: Payer: Self-pay | Admitting: Adult Health

## 2019-08-21 ENCOUNTER — Ambulatory Visit: Payer: Medicare Other | Admitting: Adult Health

## 2019-08-21 NOTE — Telephone Encounter (Signed)
Due to scheduled change this appt was change to 08-27-19.

## 2019-08-21 NOTE — Telephone Encounter (Signed)
Pt called and cancelled her 8:45am appt for yesterday due to not having transportation. She has rescheduled for today at 3:45pm

## 2019-08-27 ENCOUNTER — Other Ambulatory Visit: Payer: Self-pay

## 2019-08-27 ENCOUNTER — Encounter: Payer: Self-pay | Admitting: Adult Health

## 2019-08-27 ENCOUNTER — Telehealth: Payer: Self-pay | Admitting: Adult Health

## 2019-08-27 ENCOUNTER — Ambulatory Visit: Payer: Medicare Other | Admitting: Adult Health

## 2019-08-27 VITALS — BP 182/92 | HR 67 | Ht 63.0 in | Wt 129.0 lb

## 2019-08-27 DIAGNOSIS — R7309 Other abnormal glucose: Secondary | ICD-10-CM | POA: Diagnosis not present

## 2019-08-27 DIAGNOSIS — F411 Generalized anxiety disorder: Secondary | ICD-10-CM

## 2019-08-27 DIAGNOSIS — R449 Unspecified symptoms and signs involving general sensations and perceptions: Secondary | ICD-10-CM

## 2019-08-27 DIAGNOSIS — E785 Hyperlipidemia, unspecified: Secondary | ICD-10-CM

## 2019-08-27 DIAGNOSIS — I639 Cerebral infarction, unspecified: Secondary | ICD-10-CM | POA: Diagnosis not present

## 2019-08-27 DIAGNOSIS — I10 Essential (primary) hypertension: Secondary | ICD-10-CM

## 2019-08-27 DIAGNOSIS — R29818 Other symptoms and signs involving the nervous system: Secondary | ICD-10-CM

## 2019-08-27 MED ORDER — SERTRALINE HCL 50 MG PO TABS: 50.0000 mg | ORAL_TABLET | Freq: Every day | ORAL | 3 refills | Status: DC

## 2019-08-27 NOTE — Progress Notes (Signed)
I agree with the above plan 

## 2019-08-27 NOTE — Patient Instructions (Addendum)
Will obtain MRI brain to rule out new stroke that could be causing your imbalance and worsening memory  We will check cholesterol levels, A1c (average glucose levels), thyroid level, and B12 level  Recommend increasing sertraline from 25 mg daily to 50 mg daily due to increased anxiety  Continue aspirin 81 mg daily  and atorvastatin 80 mg daily for secondary stroke prevention  Continue to follow up with PCP regarding cholesterol and blood pressure management   Please start to monitor your blood pressure at home to ensure adequate management  Loop recorder will continue to be monitored for possible atrial fibrillation  Maintain strict control of hypertension with blood pressure goal below 130/90, diabetes with hemoglobin A1c goal below 6.5% and cholesterol with LDL cholesterol (bad cholesterol) goal below 70 mg/dL. I also advised the patient to eat a healthy diet with plenty of whole grains, cereals, fruits and vegetables, exercise regularly and maintain ideal body weight.  Followup in the future with me in 2 months or call earlier if needed       Thank you for coming to see [place] at [facility]. I hope we have been able to provide you high quality care today.  You may receive a patient satisfaction survey over the next few weeks. We would appreciate your feedback and comments so that we may continue to improve ourselves and the health of our patients.

## 2019-08-27 NOTE — Progress Notes (Signed)
[name] [address] [place] [id number] [phone]       STROKE FOLLOW UP NOTE  [name] Date of Birth:  1951/02/03 Medical Record Number:  [id number]   Reason for Referral: stroke follow up    CHIEF COMPLAINT:  Chief Complaint  Patient presents with  . Follow-up    treatment rm, alone, Pt reports balance issues, memory problems, and anxiety     HPI:  Today, 08/27/2019, [name] returns for follow-up regarding cryptogenic left BG/CR stroke in 03/2019.  She reports over the past 3 weeks she started to experience imbalance where she feels unstable with ambulation and difficulty with stepping down from a curb.  Denies vertigo, dizziness or lightheadedness.  She did not experience the symptoms prior and reports completely recovered from prior stroke.  Denies weakness, numbness/tingling, speech difficulty or visual changes.  She also reports increased anxiety and worsening memory over the past few weeks.  She does report compliance with all prescribed medications. Continues on aspirin and atorvastatin 80 mg daily for secondary stroke prevention.  No recent lab work.  Blood pressure today 182/92, asymptomatic.  She has not taken antihypertensives yet this morning.  She does not routinely monitor at home.  Loop recorder is not shown atrial fibrillation thus far.  She was evaluated by Dr. Vickey Hugerohmeier for possible sleep apnea undergoing split-night study on 05/02/2019 which did not show any evidence of sleep apnea.  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  She was started on sertraline 25 mg daily due to anxiety related concerns by Dr. Vickey Hugerohmeier.  No further concerns at this time.   History provided for reference purposes only Initial visit 04/09/2019 JM: [name] is a 69 year old female who is being seen today for hospital follow-up.  She has been recovering well from a stroke standpoint with only mild left hand  decreased fine motor control and occasional word finding difficulty.  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  She is questioning if she can return at this time.  She has completed 3 weeks DAPT and continues on aspirin alone without bleeding or bruising.  Continues on atorvastatin 80 mg daily and does complain of mild left calf pain and left side pain. She states initially feeling on right side cramping but now left. Cramping does not limit daily activity or functioning. She does have follow up with PCP in the near future and per patient, will have lab work obtained including lipid panel. Blood pressure today satisfactory at 122/80. Loop recorder has not shown atrial fibrillation thus far. Discussion regarding suspected sleep apnea during admission. Does admit to morning headaches, day time fatigue and insomnia. She has not previously underwent sleep study. She denies complete tobacco cessation. She continues to use THC occasionally but has been trying to decrease. Typically uses to help insomnia and anxiety. Loop recorder has not shown atrial fibrillation thus far. Denies new or worsening stroke/TIA symptoms.   Stroke admission 03/05/2019: Renee Rodriguez a 69 y.o.femalewith history of hypertension, GERD, arthritis and anemiawho presented on 03/05/2019 with R sided weakness and inability to get her words out. Evaluated by stroke team and Dr. Pearlean BrownieSethi with stroke work-up revealing left basal ganglia and corona radiata infarcts as evidenced on MRI embolic pattern secondary to unknown source. NIHSS score 6 and CT head no evidence of hemorrhage therefore received  tPA 03/05/2019 at 0840. In addition to acute infarcts, MRI also showed small vessel disease, old right thalamic lacune, microhemorrhage and right VA cervical artery occlusion.  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  Repeat CT head 24 hours post TPA no evidence of hemorrhage with evolving left basal ganglia and corona radiata infarct.  2D echo unremarkable.  TEE negative for cardiac source of embolus.  Loop recorder placed to evaluate for atrial fibrillation as etiology of recent stroke.  Recommended DAPT for 3 weeks and aspirin alone as she is not previously on antithrombotic.  History of HTN elevated upon presentation and treated with Cleviprex; noncompliance of prior home dose antihypertensive and recommended long-term pedal normotensive range.  LDL 200 previously prescribed atorvastatin 20 mg daily but reported noncompliance and recommended initiating atorvastatin 80 mg daily.  No evidence or history of DM with A1c 5.4.  UDS positive for THC.  Other stroke risk factors include advanced age, tobacco use, EtOH use, THC use, family history of stroke and possible OSA.  Discharged home in stable condition recommendation of outpatient therapy.  Stroke:L basal ganglia and corona radiatainfarct embolic secondary to unknownsource  Code Stroke CT head No acute abnormality. Small vessel disease.Old R thalamic infarct.ASPECTS 10.   CTA head & neckR VA origin occlusion w/ hypoplastic R V4. Mild ICA bifurcation atherosclerosis. Mild small vessel disease. Degenerative CS.  MRISmall posterior lentiform nucleus and corona radiata infarct >2cm in size. Small vessel disease. Old R thalamic lacune and micro hemorrhage. R VA cervical artery occlusion.   Repeat CT head 24h no hemorrhage. evolving L basal ganglia and corona radiata infarct  2D EchoEF60-65%. No source of embolus. RA dilated.  TEE neg for embolic source.  implantable loop recorder placed 1/7 to evaluate for atrial fibrillation as etiology of stroke([place])  We will not perform a TCD bubble given age > 104,  LDL200  HgbA1c5.4  UDS positive THC  No  antithromboticprior to admission, started ib aspirin 81 mg and plavix 75 mg daily x 3 weeks, then aspirin alone.   Therapy recommendations:OP OT, no PT, no SLP  Disposition:return home      ROS:   14 system review of systems performed and negative with exception of imbalance, memory loss and anxiety  PMH:  Past Medical History:  Diagnosis Date  . Anemia   . Arthritis   . GERD (gastroesophageal reflux disease)   . Hypertension     PSH:  Past Surgical History:  Procedure Laterality Date  . BUBBLE STUDY  03/07/2019   Procedure: BUBBLE STUDY;  Surgeon: Jake Bathe, MD;  Location[facility] ENDOSCOPY;  Service: Cardiovascular;;  . CESAREAN SECTION  08/1982  . LOOP RECORDER INSERTION N/A 03/07/2019   Procedure: LOOP RECORDER INSERTION;  Surgeon: Hillis Range, MD;  Location: [facility] INVASIVE CV LAB;  Service: Cardiovascular;  Laterality: N/A;  . TEE WITHOUT CARDIOVERSION N/A 03/07/2019   Procedure: TRANSESOPHAGEAL ECHOCARDIOGRAM (TEE);  Surgeon: Jake Bathe, MD;  Location: [facility] ENDOSCOPY;  Service: Cardiovascular;  Laterality: N/A;  . 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 TUBAL LIGATION  08/1982,01/1985    Social History:  Social History   Socioeconomic History  . Marital status: Legally Separated    Spouse name: Not on  file  . Number of children: Not on file  . Years of education: Not on file  . Highest education level: Not on file  Occupational History  . Occupation: Building control surveyor  Tobacco Use  . Smoking status: Former Smoker    Types: Cigarettes    Quit date: 03/04/2019    Years since quitting: 0.4  . Smokeless tobacco: Never Used  Substance and Sexual Activity  . Alcohol use: Yes    Alcohol/week: 3.0 standard drinks    Types: 3 Standard drinks or equivalent per week    Comment:  occasionally beer  . Drug use: No  . Sexual activity: Not on file  Other Topics Concern  . Not on file  Social History Narrative  . Not on file   Social Determinants of Health   Financial Resource Strain:   . Difficulty of Paying Living Expenses:   Food Insecurity:   . Worried About Programme researcher, broadcasting/film/video in the Last Year:   . Barista in the Last Year:   Transportation Needs:   . Freight forwarder (Medical):   Marland Kitchen Lack of Transportation (Non-Medical):   Physical Activity:   . Days of Exercise per Week:   . Minutes of Exercise per Session:   Stress:   . Feeling of Stress :   Social Connections:   . Frequency of Communication with Friends and Family:   . Frequency of Social Gatherings with Friends and Family:   . Attends Religious Services:   . Active Member of Clubs or Organizations:   . Attends Banker Meetings:   Marland Kitchen Marital Status:   Intimate Partner Violence:   . Fear of Current or Ex-Partner:   . Emotionally Abused:   Marland Kitchen Physically Abused:   . Sexually Abused:     Family History:  Family History  Problem Relation Age of Onset  . Stroke Mother        Cerebral hemorrhage  . Asthma Mother   . Alcohol abuse Mother   . Kidney disease Mother   . Hypertension Mother   . Cirrhosis Mother   . Hypertension Sister   . Colon cancer Neg Hx     Medications:   Current Outpatient Medications on File Prior to Visit  Medication Sig Dispense Refill  . aspirin EC 81 MG EC tablet Take 1 tablet (81 mg total) by mouth daily.    Marland Kitchen atorvastatin (LIPITOR) 80 MG tablet Take 1 tablet (80 mg total) by mouth daily. 30 tablet 2  . hydrochlorothiazide (HYDRODIURIL) 25 MG tablet Take 1 tablet (25 mg total) by mouth daily. 30 tablet 2  . lisinopril (ZESTRIL) 20 MG tablet Take 1 tablet (20 mg total) by mouth daily. 30 tablet 2   No current facility-administered medications on file prior to visit.    Allergies:   Allergies  Allergen Reactions  . Shellfish Allergy  Anaphylaxis     Physical Exam  Vitals:   08/27/19 0803 08/27/19 0811 08/27/19 0842  BP: (!) 195/106 (!) 190/111 (!) 182/92  Pulse: 67    Weight: 129 lb (58.5 kg)    Height: 5\' 3"  (1.6 m)     Body mass index is 22.85 kg/m. No exam data present  General: well developed, well nourished, pleasant elderly AA female, seated, in no evident distress Head: head normocephalic and atraumatic.   Neck: supple with no carotid or supraclavicular bruits Cardiovascular: regular rate and rhythm, no murmurs Musculoskeletal: no deformity Skin:  no rash/petichiae Vascular:  Normal  pulses all extremities   Neurologic Exam Mental Status: Awake and fully alert. Occasional word finding difficulty/hesitancy. Oriented to place and time. Recent memory subjectively impaired and remote memory intact. Attention span, concentration and fund of knowledge appropriate during visit. Mood and affect appropriate.  Cranial Nerves: Pupils equal, briskly reactive to light. Extraocular movements full without nystagmus. Visual fields full to confrontation. Hearing intact. Facial sensation intact. Face, tongue, palate moves normally and symmetrically.  Motor: Normal bulk and tone. Normal strength in all tested extremity muscles  Sensory.:  Decreased light touch and pinprick sensation distal lower extremities Coordination: Rapid alternating movements normal in all extremities except mildly decreased left hand. Finger-to-nose and heel-to-shin performed accurately bilaterally. Gait and Station: Arises from chair without difficulty. Stance is normal. Gait demonstrates normal stride length with imbalance.  Unable to stand on single leg or perform tandem walk.  Romberg positive. Reflexes: 1+ and symmetric. Toes downgoing.        ASSESSMENT: Renee Rodriguez is a 69 y.o. year old female presented with right-sided weakness and expressive aphasia on 03/05/2019 with stroke work-up revealing left basal ganglia and corona radiata  infarcts s/p TPA secondary to unknown source.  Loop recorder placed for long-term monitoring of possible atrial fibrillation and stroke etiology.  Vascular risk factors include HTN, HLD, tobacco use, THC use, EtOH use, and hx of medication noncompliance.  Underwent sleep study which was negative for sleep apnea.  Today reports 3-week onset of imbalance and worsening anxiety and memory loss    PLAN:  1. Imbalance: -Obtain MRI to rule out new stroke -Possibly due to decreased sensation bilateral lower extremities -denies numbness/tingling, pain, back pain or weakness.  Will obtain A1c, B12 and TSH -Consider participation in outpatient therapy but patient prefers to hold off at this time 2. Cryptogenic stroke:  -Continue aspirin 81 mg daily  and atorvastatin 80 mg daily for secondary stroke prevention.   -Loop recorder will continue to be monitored for atrial fibrillation. -Continue to follow closely with PCP for secondary stroke risk factor management 3. HTN:  -BP goal <130/90.  -Elevated today likely due to not taking antihypertensives prior to visit -Encouraged to monitor at home and to follow-up with PCP if remains elevated 4. HLD:  -LDL goal<90 -Repeat lipid panel -Continue atorvastatin 80 mg daily with ongoing prescribing and follow-up by PCP 5. Anxiety and memory loss: -Increase sertraline to 50 mg daily -Reports memory concerns which could potentially be due to prior stroke, untreated anxiety/depression and increased stressors.  Obtain B12 and TSH   Follow up in 2 months or call earlier if needed   I spent 40 minutes of face-to-face and non-face-to-face time with patient.  This included previsit chart review, lab review, study review, order entry, electronic health record documentation, patient education regarding cryptogenic stroke, new onset imbalance, worsening anxiety and memory loss, importance of managing stroke risk factors and answered all questions to patient  satisfaction     Ihor Austin, [facility]  [facility] [address] [place], [place] [address]  Phone [phone] Fax [phone] Note: This document was prepared with digital dictation and possible smart phrase technology. Any transcriptional errors that result from this process are unintentional.

## 2019-08-27 NOTE — Telephone Encounter (Signed)
[employer] medicare no auth. patient has loop recorder faxed info to [facility] to see if it is MRI safe. If it is MRI safe they will reach out to the patient to schedule.

## 2019-08-28 ENCOUNTER — Other Ambulatory Visit: Payer: Self-pay | Admitting: Adult Health

## 2019-08-28 ENCOUNTER — Telehealth: Payer: Self-pay | Admitting: *Deleted

## 2019-08-28 LAB — LIPID PANEL
Chol/HDL Ratio: 4 ratio (ref 0.0–4.4)
Cholesterol, Total: 218 mg/dL — ABNORMAL HIGH (ref 100–199)
HDL: 55 mg/dL (ref >39)
LDL Chol Calc (NIH): 133 mg/dL — ABNORMAL HIGH (ref 0–99)
Triglycerides: 170 mg/dL — ABNORMAL HIGH (ref 0–149)
VLDL Cholesterol Cal: 30 mg/dL (ref 5–40)

## 2019-08-28 LAB — HEMOGLOBIN A1C
Est. average glucose Bld gHb Est-mCnc: 111 mg/dL
Hgb A1c MFr Bld: 5.5 % (ref 4.8–5.6)

## 2019-08-28 LAB — TSH: TSH: 0.309 u[IU]/mL — ABNORMAL LOW (ref 0.450–4.500)

## 2019-08-28 LAB — VITAMIN B12: Vitamin B-12: 607 pg/mL (ref 232–1245)

## 2019-08-28 MED ORDER — EZETIMIBE 10 MG PO TABS: 10.0000 mg | ORAL_TABLET | Freq: Every day | ORAL | 3 refills | Status: DC

## 2019-08-28 NOTE — Telephone Encounter (Signed)
I spoke to pt and relayed the lab results to her.  Adding to her med regimen another called zetia 10mg  po daily for elevated LDL.  Has low TSH.  B12 normal mid range. Will send to pcp. She verbalized understanding. Medication is already called in.

## 2019-09-02 ENCOUNTER — Other Ambulatory Visit: Payer: Self-pay | Admitting: Adult Health

## 2019-09-23 ENCOUNTER — Ambulatory Visit (INDEPENDENT_AMBULATORY_CARE_PROVIDER_SITE_OTHER): Payer: Medicare Other | Admitting: *Deleted

## 2019-09-23 DIAGNOSIS — I639 Cerebral infarction, unspecified: Secondary | ICD-10-CM

## 2019-09-23 NOTE — Telephone Encounter (Signed)
Patient is scheduled at [facility] for 10/04/19.

## 2019-09-24 ENCOUNTER — Ambulatory Visit (HOSPITAL_COMMUNITY): Payer: Medicare Other

## 2019-09-24 LAB — CUP PACEART REMOTE DEVICE CHECK
Date Time Interrogation Session: 20210725230536
Implantable Pulse Generator Implant Date: 20210107

## 2019-09-25 ENCOUNTER — Other Ambulatory Visit: Payer: Self-pay | Admitting: Internal Medicine

## 2019-09-25 DIAGNOSIS — Z1231 Encounter for screening mammogram for malignant neoplasm of breast: Secondary | ICD-10-CM

## 2019-09-26 NOTE — Progress Notes (Signed)
Carelink Summary Report / Loop Recorder 

## 2019-10-04 ENCOUNTER — Ambulatory Visit (HOSPITAL_COMMUNITY)
Admission: RE | Admit: 2019-10-04 | Discharge: 2019-10-04 | Disposition: A | Discharge status: Home / Self Care | Payer: Medicare Other | Source: Ambulatory Visit | Attending: Adult Health | Admitting: Adult Health

## 2019-10-04 DIAGNOSIS — R29818 Other symptoms and signs involving the nervous system: Secondary | ICD-10-CM | POA: Insufficient documentation

## 2019-10-10 ENCOUNTER — Telehealth: Payer: Self-pay | Admitting: Adult Health

## 2019-10-10 MED ORDER — CLOPIDOGREL BISULFATE 75 MG PO TABS: 75.0000 mg | ORAL_TABLET | Freq: Every day | ORAL | 3 refills | Status: DC

## 2019-10-10 NOTE — Telephone Encounter (Signed)
Called patient in regards to her recent MRI results.   MRI completed on October 04, 2019 due to new complaints of imbalance, fatigue and memory loss.  MRI showed small, likely subacute infarct of the right corona radiata.  Lab work also obtained on August 27, 2019 which showed total cholesterol 218, triglycerides 170 and LDL 133.  She endorsed compliance with atorvastatin 80 mg daily therefore added Zetia 10 mg daily in addition.  She has continued that regimen and plan on repeating lipid panel at follow-up visit.  She reports ongoing compliance with aspirin 81 mg daily therefore recommend switching to Plavix due to recurrent stroke despite aspirin usage.  An order for Plavix will be sent to her pharmacy.  She verbalized understanding of imaging results and medication changes.  No questions or concerns at this time and appreciative of phone call.

## 2019-10-17 ENCOUNTER — Other Ambulatory Visit: Payer: Self-pay

## 2019-10-17 ENCOUNTER — Ambulatory Visit
Admission: RE | Admit: 2019-10-17 | Discharge: 2019-10-17 | Disposition: A | Discharge status: Home / Self Care | Payer: Medicare Other | Source: Ambulatory Visit | Attending: Internal Medicine | Admitting: Internal Medicine

## 2019-10-17 DIAGNOSIS — Z1231 Encounter for screening mammogram for malignant neoplasm of breast: Secondary | ICD-10-CM

## 2019-10-28 ENCOUNTER — Other Ambulatory Visit: Payer: Self-pay

## 2019-10-28 ENCOUNTER — Encounter: Payer: Self-pay | Admitting: Adult Health

## 2019-10-28 ENCOUNTER — Ambulatory Visit (INDEPENDENT_AMBULATORY_CARE_PROVIDER_SITE_OTHER): Payer: Medicare Other | Admitting: *Deleted

## 2019-10-28 ENCOUNTER — Ambulatory Visit: Payer: Medicare Other | Admitting: Adult Health

## 2019-10-28 VITALS — BP 164/90 | HR 68 | Ht 63.0 in | Wt 131.0 lb

## 2019-10-28 DIAGNOSIS — I10 Essential (primary) hypertension: Secondary | ICD-10-CM

## 2019-10-28 DIAGNOSIS — I69398 Other sequelae of cerebral infarction: Secondary | ICD-10-CM | POA: Diagnosis not present

## 2019-10-28 DIAGNOSIS — R2689 Other abnormalities of gait and mobility: Secondary | ICD-10-CM

## 2019-10-28 DIAGNOSIS — E785 Hyperlipidemia, unspecified: Secondary | ICD-10-CM

## 2019-10-28 DIAGNOSIS — I639 Cerebral infarction, unspecified: Secondary | ICD-10-CM

## 2019-10-28 DIAGNOSIS — F411 Generalized anxiety disorder: Secondary | ICD-10-CM

## 2019-10-28 NOTE — Patient Instructions (Signed)
Continue clopidogrel 75 mg daily  and atorvastatin 80 mg daily and Zetia 10 mg daily for secondary stroke prevention  Loop recorder has not shown atrial fibrillation at this time.  Recorder will continue to be monitored by cardiology  Continue to follow up with PCP regarding cholesterol and blood pressure management  Maintain strict control of hypertension with blood pressure goal below 130/90 and cholesterol with LDL cholesterol (bad cholesterol) goal below 70 mg/dL.       Followup in the future with me in 4 months or call earlier if needed       Thank you for coming to see [place] at [facility]. I hope we have been able to provide you high quality care today.  You may receive a patient satisfaction survey over the next few weeks. We would appreciate your feedback and comments so that we may continue to improve ourselves and the health of our patients.    Dizziness Dizziness is a common problem. It is a feeling of unsteadiness or light-headedness. You may feel like you are about to faint. Dizziness can lead to injury if you stumble or fall. Anyone can become dizzy, but dizziness is more common in older adults. This condition can be caused by a number of things, including medicines, dehydration, or illness. Follow these instructions at home: Eating and drinking  Drink enough fluid to keep your urine clear or pale yellow. This helps to keep you from becoming dehydrated. Try to drink more clear fluids, such as water.  Do not drink alcohol.  Limit your caffeine intake if told to do so by your health care provider. Check ingredients and nutrition facts to see if a food or beverage contains caffeine.  Limit your salt (sodium) intake if told to do so by your health care provider. Check ingredients and nutrition facts to see if a food or beverage contains sodium. Activity  Avoid making quick movements. ? Rise slowly from chairs and steady yourself until you feel  okay. ? In the morning, first sit up on the side of the bed. When you feel okay, stand slowly while you hold onto something until you know that your balance is fine.  If you need to stand in one place for a long time, move your legs often. Tighten and relax the muscles in your legs while you are standing.  May also use compression stockings for potential benefit  Do not drive or use heavy machinery if you feel dizzy.  Avoid bending down if you feel dizzy. Place items in your home so that they are easy for you to reach without leaning over. Lifestyle  Do not use any products that contain nicotine or tobacco, such as cigarettes and e-cigarettes. If you need help quitting, ask your health care provider.  Try to reduce your stress level by using methods such as yoga or meditation. Talk with your health care provider if you need help to manage your stress. General instructions  Watch your dizziness for any changes.  Take over-the-counter and prescription medicines only as told by your health care provider. Talk with your health care provider if you think that your dizziness is caused by a medicine that you are taking.  Tell a friend or a family member that you are feeling dizzy. If he or she notices any changes in your behavior, have this person call your health care provider.  Keep all follow-up visits as told by your health care provider. This is important. Contact a health care  provider if:  Your dizziness does not go away.  Your dizziness or light-headedness gets worse.  You feel nauseous.  You have reduced hearing.  You have new symptoms.  You are unsteady on your feet or you feel like the room is spinning. Get help right away if:  You vomit or have diarrhea and are unable to eat or drink anything.  You have problems talking, walking, swallowing, or using your arms, hands, or legs.  You feel generally weak.  You are not thinking clearly or you have trouble forming  sentences. It may take a friend or family member to notice this.  You have chest pain, abdominal pain, shortness of breath, or sweating.  Your vision changes.  You have any bleeding.  You have a severe headache.  You have neck pain or a stiff neck.  You have a fever. These symptoms may represent a serious problem that is an emergency. Do not wait to see if the symptoms will go away. Get medical help right away. Call your local emergency services (911 in the [place] Do not drive yourself to the hospital. Summary  Dizziness is a feeling of unsteadiness or light-headedness. This condition can be caused by a number of things, including medicines, dehydration, or illness.  Anyone can become dizzy, but dizziness is more common in older adults.  Drink enough fluid to keep your urine clear or pale yellow. Do not drink alcohol.  Avoid making quick movements if you feel dizzy. Monitor your dizziness for any changes. This information is not intended to replace advice given to you by your health care provider. Make sure you discuss any questions you have with your health care provider. Document Revised: 02/17/2017 Document Reviewed: 03/19/2016 Elsevier Patient Education  2020 [employer].

## 2019-10-28 NOTE — Progress Notes (Signed)
[name] [address] [place] [id number] [phone]       STROKE FOLLOW UP NOTE  [name] Date of Birth:  [birth year] Medical Record Number:  [id number]   Reason for Referral: stroke follow up    CHIEF COMPLAINT:  Chief Complaint  Patient presents with  . Follow-up    2 month f/u. States she has had a minor stroke since last visit.   Marland Kitchen. treatment room    alone     HPI:  Today, 10/28/2019, Renee Rodriguez returns for follow-up visit  Prior complaints of imbalance, worsening anxiety and memory loss with MRI showing subacute right corona radiata infarct that was not present on imaging in 03/2019  Imbalance has been greatly improving since prior visit She does report occasional dizziness type sensation especially after sitting for prolonged period of time or when getting out of bed in the morning Memory loss and anxiety have been greatly improving.  Occasional difficulty with anxiety. Increased sertraline to 50 mg nightly after prior visit with improvement and denies side effects.  Denies new or worsening stroke/TIA symptoms  Remains on clopidogrel without bleeding or bruising.  Previously on aspirin but changed to clopidogrel due to recurrent stroke despite aspirin compliance Continues on atorvastatin 80 mg daily and Zetia 10 mg daily without side effects.  Zetia recently added due to continued elevated LDL at 131 which is above goal of 70 Blood pressure today 164/90, asymptomatic.  Does not routinely monitor at home Loop recorder has not shown atrial fibrillation thus far  No further concerns at this time    History provided for reference purposes only Update 08/27/2019 JM: Renee Rodriguez returns for follow-up regarding cryptogenic left BG/CR stroke in 03/2019.  She reports over the past 3 weeks she started to experience imbalance where she feels unstable with ambulation and difficulty with stepping down from a curb.  Denies vertigo, dizziness  or lightheadedness.  She did not experience the symptoms prior and reports completely recovered from prior stroke.  Denies weakness, numbness/tingling, speech difficulty or visual changes.  She also reports increased anxiety and worsening memory over the past few weeks.  She does report compliance with all prescribed medications. Continues on aspirin and atorvastatin 80 mg daily for secondary stroke prevention.  No recent lab work.  Blood pressure today 182/92, asymptomatic.  She has not taken antihypertensives yet this morning.  She does not routinely monitor at home.  Loop recorder is not shown atrial fibrillation thus far.  She was evaluated by Dr. Vickey Hugerohmeier for possible sleep apnea undergoing split-night study on 05/02/2019 which did not show any evidence of sleep apnea.  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  She was started on sertraline 25 mg daily due to anxiety related concerns by Dr. Vickey Hugerohmeier.  No further concerns at this time.  Initial visit 04/09/2019 JM: Renee Rodriguez is a 69 year old female who is being seen today for hospital follow-up.  She has been recovering well from a stroke standpoint with only mild left hand decreased fine motor control and occasional word finding difficulty.  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  She is questioning if she can return at this time.  She has completed 3 weeks DAPT and continues on aspirin alone without bleeding or bruising.  Continues on atorvastatin 80 mg daily and does complain of mild  left calf pain and left side pain. She states initially feeling on right side cramping but now left. Cramping does not limit daily activity or functioning. She does have follow up with PCP in the near future and per patient, will have lab work obtained including lipid panel. Blood pressure today satisfactory at 122/80. Loop recorder  has not shown atrial fibrillation thus far. Discussion regarding suspected sleep apnea during admission. Does admit to morning headaches, day time fatigue and insomnia. She has not previously underwent sleep study. She denies complete tobacco cessation. She continues to use THC occasionally but has been trying to decrease. Typically uses to help insomnia and anxiety. Loop recorder has not shown atrial fibrillation thus far. Denies new or worsening stroke/TIA symptoms.   Stroke admission 03/05/2019: [name] a 69 y.o.femalewith history of hypertension, GERD, arthritis and anemiawho presented on 03/05/2019 with R sided weakness and inability to get her words out. Evaluated by stroke team and Dr. Pearlean Brownie with stroke work-up revealing left basal ganglia and corona radiata infarcts as evidenced on MRI embolic pattern secondary to unknown source. NIHSS score 6 and CT head no evidence of hemorrhage therefore received tPA 03/05/2019 at 0840. In addition to acute infarcts, MRI also showed small vessel disease, old right thalamic lacune, microhemorrhage and right VA cervical artery occlusion.  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  Repeat CT head 24 hours post TPA no evidence of hemorrhage with evolving left basal ganglia and corona radiata infarct.  2D echo unremarkable.  TEE negative for cardiac source of embolus.  Loop recorder placed to evaluate for atrial fibrillation as etiology of recent stroke.  Recommended DAPT for 3 weeks and aspirin alone as she is not previously on antithrombotic.  History of HTN elevated upon presentation and treated with Cleviprex; noncompliance of prior home dose antihypertensive and recommended long-term pedal normotensive range.  LDL 200 previously prescribed atorvastatin 20 mg daily but reported noncompliance and recommended initiating atorvastatin 80 mg  daily.  No evidence or history of DM with A1c 5.4.  UDS positive for THC.  Other stroke risk factors include advanced age, tobacco use, EtOH use, THC use, family history of stroke and possible OSA.  Discharged home in stable condition recommendation of outpatient therapy.  Stroke:L basal ganglia and corona radiatainfarct embolic secondary to unknownsource  Code Stroke CT head No acute abnormality. Small vessel disease.Old R thalamic infarct.ASPECTS 10.   CTA head & neckR VA origin occlusion w/ hypoplastic R V4. Mild ICA bifurcation atherosclerosis. Mild small vessel disease. Degenerative CS.  MRISmall posterior lentiform nucleus and corona radiata infarct >2cm in size. Small vessel disease. Old R thalamic lacune and micro hemorrhage. R VA cervical artery occlusion.   Repeat CT head 24h no hemorrhage. evolving L basal ganglia and corona radiata infarct  2D EchoEF60-65%. No source of embolus. RA dilated.  TEE neg for embolic source.  implantable loop recorder placed 1/7 to evaluate for atrial fibrillation as etiology of stroke([place])  We will not perform a TCD bubble given age > 72,  LDL200  HgbA1c5.4  UDS positive THC  No antithromboticprior to admission, started ib aspirin 81 mg and plavix 75 mg daily x 3 weeks, then aspirin alone.   Therapy recommendations:OP OT, no PT, no SLP  Disposition:return home      ROS:   14 system review of systems performed and negative with exception of dizziness, imbalance and anxiety  PMH:  Past Medical History:  Diagnosis Date  . Anemia   . Arthritis   . GERD (gastroesophageal reflux disease)   . Hypertension     PSH:  Past Surgical History:  Procedure Laterality Date  . BUBBLE STUDY  03/07/2019   Procedure: BUBBLE STUDY;  Surgeon: Jake Bathe, MD;  Location: [facility] ENDOSCOPY;  Service: Cardiovascular;;  . CESAREAN SECTION  08/1982  . LOOP RECORDER INSERTION N/A 03/07/2019   Procedure: LOOP RECORDER  INSERTION;  Surgeon: Hillis Range, MD;  Location: [facility] INVASIVE CV LAB;  Service: Cardiovascular;  Laterality: N/A;  . TEE WITHOUT CARDIOVERSION N/A 03/07/2019   Procedure: TRANSESOPHAGEAL ECHOCARDIOGRAM (TEE);  Surgeon: Jake Bathe, MD;  Location: [facility] ENDOSCOPY;  Service: Cardiovascular;  Laterality: N/A;  . 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 TUBAL LIGATION  08/1982,01/1985    Social History:  Social History   Socioeconomic History  . Marital status: Legally Separated    Spouse name: Not on file  . Number of children: Not on file  . Years of education: Not on file  . Highest education level: Not on file  Occupational History  . Occupation: Building control surveyor  Tobacco Use  . Smoking status: Former Smoker    Types: Cigarettes    Quit date: 03/04/2019    Years since quitting: 0.6  . Smokeless tobacco: Never Used  Substance and Sexual Activity  . Alcohol use: Yes    Alcohol/week: 3.0 standard drinks    Types: 3 Standard drinks or equivalent per week    Comment: occasionally beer  . Drug use: No  . Sexual activity: Not on file  Other Topics Concern  . Not on file  Social History Narrative  . Not on file   Social Determinants of Health   Financial Resource Strain:   . Difficulty of Paying Living Expenses: Not on file  Food Insecurity:   . Worried About Programme researcher, broadcasting/film/video in the Last Year: Not on file  . Ran Out of Food in the Last Year: Not on file  Transportation Needs:   . Lack of Transportation (Medical): Not on file  . Lack of Transportation (Non-Medical): Not on file  Physical Activity:   . Days of Exercise per Week: Not on file  . Minutes of Exercise per Session: Not on file  Stress:   . Feeling of Stress : Not on file  Social Connections:   . Frequency of Communication with  Friends and Family: Not on file  . Frequency of Social Gatherings with Friends and Family: Not on file  . Attends Religious Services: Not on file  . Active Member of Clubs or Organizations: Not on file  . Attends Banker Meetings: Not on file  . Marital Status: Not on file  Intimate Partner Violence:   . Fear of Current or Ex-Partner: Not on file  . Emotionally Abused: Not on file  . Physically Abused: Not on file  . Sexually Abused: Not on file    Family History:  Family History  Problem Relation Age of Onset  . Stroke Mother        Cerebral hemorrhage  . Asthma Mother   . Alcohol abuse Mother   . Kidney disease Mother   . Hypertension Mother   . Cirrhosis Mother   . Hypertension Sister   .  Colon cancer Neg Hx     Medications:   Current Outpatient Medications on File Prior to Visit  Medication Sig Dispense Refill  . atorvastatin (LIPITOR) 80 MG tablet Take 1 tablet (80 mg total) by mouth daily. 30 tablet 2  . clopidogrel (PLAVIX) 75 MG tablet Take 1 tablet (75 mg total) by mouth daily. 90 tablet 3  . ezetimibe (ZETIA) 10 MG tablet Take 1 tablet (10 mg total) by mouth daily. 90 tablet 3  . hydrochlorothiazide (HYDRODIURIL) 25 MG tablet Take 1 tablet (25 mg total) by mouth daily. 30 tablet 2  . lisinopril (ZESTRIL) 20 MG tablet Take 1 tablet (20 mg total) by mouth daily. 30 tablet 2  . sertraline (ZOLOFT) 50 MG tablet Take 1 tablet (50 mg total) by mouth at bedtime. 90 tablet 3   No current facility-administered medications on file prior to visit.    Allergies:   Allergies  Allergen Reactions  . Shellfish Allergy Anaphylaxis     Physical Exam  Vitals:   10/28/19 0759  BP: (!) 164/90  Pulse: 68  Weight: 131 lb (59.4 kg)  Height:  (1.6 m)   Body mass index is 23.21 kg/m. No exam data present  General: well developed, well nourished, pleasant elderly AA female, seated, in no evident distress Head: head normocephalic and atraumatic.   Neck:  supple with no carotid or supraclavicular bruits Cardiovascular: regular rate and rhythm, no murmurs Musculoskeletal: no deformity Skin:  no rash/petichiae Vascular:  Normal pulses all extremities   Neurologic Exam Mental Status: Awake and fully alert.   Fluent speech and language.  Oriented to place and time. Recent memory subjectively impaired and remote memory intact. Attention span, concentration and fund of knowledge appropriate during visit. Mood and affect appropriate.  Cranial Nerves: Pupils equal, briskly reactive to light. Extraocular movements full without nystagmus. Visual fields full to confrontation. Hearing intact. Facial sensation intact. Face, tongue, palate moves normally and symmetrically.  Motor: Normal bulk and tone. Normal strength in all tested extremity muscles  Sensory.:  Intact to light touch, vibratory and pinprick sensation in all tested extremities Coordination: Rapid alternating movements normal in all extremities. Finger-to-nose and heel-to-shin performed accurately bilaterally. Gait and Station: Arises from chair without difficulty. Stance is normal. Gait demonstrates normal stride length with only mild unsteadiness with gait initiation.  Difficulty with tandem walk.  Romberg negative. Reflexes: 1+ and symmetric. Toes downgoing.        ASSESSMENT: Renee Rodriguez is a 69 y.o. year old female presented with right-sided weakness and expressive aphasia on 03/05/2019 with stroke work-up revealing left basal ganglia and corona radiata infarcts s/p TPA secondary to unknown source.  Loop recorder placed for long-term monitoring of possible atrial fibrillation and stroke etiology.  Found to have subacute right corona radiata infarct on imaging in 09/2019 with complaints of imbalance, worsening anxiety and memory loss.  Vascular risk factors include HTN, HLD, tobacco use, THC use, EtOH use, and hx of medication noncompliance.  Underwent sleep study which was negative for sleep  apnea.     PLAN:  1. R CR stroke a. Subacute infarct demonstrated on imaging 10/04/2019 new from prior imaging in 03/2019 b. Complaints of imbalance greatly improving with only mild difficulty c. Due to recurrent stroke despite compliance on aspirin, switched to Plavix due to aspirin failure d. Continue Plavix and atorvastatin 80 mg daily and Zetia 10 mg daily for secondary stroke prevention e. Ensure close PCP follow-up for aggressive stroke risk factor management 2.  L BG and CR stroke, Cryptogenic: a. See #1 b. Loop recorder has not shown atrial fibrillation thus far.  Mildly reports personally reviewed 3. HTN:  a. BP goal <130/90.  b. Elevated today, asymptomatic.  c. Encouraged to monitor at home and to follow-up with PCP if remains elevated 4. HLD:  a. LDL goal<70 b. Lipid panel 08/27/2019 LDL 133 therefore added Zetia in addition to atorvastatin c. Per patient, had recent lab work obtained by PCP which was satisfactory -unable to personally review via epic d. Continue Zetia and atorvastatin 80 mg daily with ongoing prescribing and follow-up by PCP 5. Anxiety: a. Continue sertraline to 50 mg daily b. Discussed stress reduction techniques such as counseling, biofeedback training, meditation and yoga c. Ongoing monitoring and prescribing sertraline managed by PCP   Follow up in 4 months or call earlier if needed   I spent 30 minutes of face-to-face and non-face-to-face time with patient.  This included previsit chart review, lab review, study review, order entry, electronic health record documentation, patient education regarding recurrent stroke and prior stroke with residual deficits, importance of managing stroke risk factors, and answered all questions to patient satisfaction     Ihor Austin, [facility]  [facility] [address] [place], [place] [address]  Phone [phone] Fax [phone] Note: This document was prepared with digital  dictation and possible smart phrase technology. Any transcriptional errors that result from this process are unintentional.

## 2019-10-29 LAB — CUP PACEART REMOTE DEVICE CHECK
Date Time Interrogation Session: 20210827230553
Implantable Pulse Generator Implant Date: 20210107

## 2019-10-29 NOTE — Progress Notes (Signed)
I agree with the above plan 

## 2019-10-30 ENCOUNTER — Telehealth: Payer: Self-pay | Admitting: Adult Health

## 2019-10-30 HISTORY — PX: CATARACT EXTRACTION: SUR2

## 2019-10-30 NOTE — Telephone Encounter (Signed)
I called pt and she said her work is asking for her to come back to work, and she is ok to do so,  She is not doing therapy, is scheduled for  Cataract surgery 11-25-19. She feels like working will help with her depression.  Need note  to [employer] (she answers phone and directs people) , that she can work light duty 4 hours a day.  If ok will place on mychart for her.

## 2019-10-30 NOTE — Telephone Encounter (Signed)
There was no discussion at prior visit regarding being out of work or concern of patient returning to work.  Please provide letter the patient can return to work on light duty for max of 4 hours/day.  Thank you.

## 2019-10-30 NOTE — Progress Notes (Signed)
Carelink Summary Report / Loop Recorder 

## 2019-10-30 NOTE — Telephone Encounter (Signed)
Pt called stating that her job is pressuring her and that they are requiring her to work at least 4 hours on her scheduled days. Pt is needing a letter stating that she can work as long as it is light duty. Please advise.

## 2019-10-31 NOTE — Telephone Encounter (Signed)
Letter sent to mychart,  Pt notified

## 2019-12-02 ENCOUNTER — Ambulatory Visit (INDEPENDENT_AMBULATORY_CARE_PROVIDER_SITE_OTHER): Payer: Medicare Other

## 2019-12-02 DIAGNOSIS — I639 Cerebral infarction, unspecified: Secondary | ICD-10-CM

## 2019-12-02 LAB — CUP PACEART REMOTE DEVICE CHECK
Date Time Interrogation Session: 20210929230446
Implantable Pulse Generator Implant Date: 20210107

## 2019-12-04 NOTE — Progress Notes (Signed)
Carelink Summary Report / Loop Recorder 

## 2020-01-03 LAB — CUP PACEART REMOTE DEVICE CHECK
Date Time Interrogation Session: 20211101230355
Implantable Pulse Generator Implant Date: 20210107

## 2020-02-09 LAB — CUP PACEART REMOTE DEVICE CHECK
Date Time Interrogation Session: 20211204230312
Implantable Pulse Generator Implant Date: 20210107

## 2020-02-10 ENCOUNTER — Ambulatory Visit (INDEPENDENT_AMBULATORY_CARE_PROVIDER_SITE_OTHER): Payer: Medicare Other

## 2020-02-10 DIAGNOSIS — I639 Cerebral infarction, unspecified: Secondary | ICD-10-CM

## 2020-02-24 NOTE — Progress Notes (Signed)
Carelink Summary Report / Loop Recorder 

## 2020-02-27 ENCOUNTER — Ambulatory Visit: Payer: Medicare Other | Admitting: Adult Health

## 2020-03-11 ENCOUNTER — Encounter: Payer: Self-pay | Admitting: Adult Health

## 2020-03-11 ENCOUNTER — Ambulatory Visit (INDEPENDENT_AMBULATORY_CARE_PROVIDER_SITE_OTHER): Payer: Medicare Other | Admitting: Adult Health

## 2020-03-11 VITALS — BP 144/88 | HR 79 | Ht 63.0 in | Wt 135.6 lb

## 2020-03-11 DIAGNOSIS — I639 Cerebral infarction, unspecified: Secondary | ICD-10-CM

## 2020-03-11 DIAGNOSIS — F411 Generalized anxiety disorder: Secondary | ICD-10-CM | POA: Diagnosis not present

## 2020-03-11 DIAGNOSIS — E785 Hyperlipidemia, unspecified: Secondary | ICD-10-CM

## 2020-03-11 DIAGNOSIS — I10 Essential (primary) hypertension: Secondary | ICD-10-CM | POA: Diagnosis not present

## 2020-03-11 NOTE — Progress Notes (Signed)
[name] [address] [place]. [place] [id number] [phone]       STROKE FOLLOW UP NOTE  Renee Rodriguez Date of Birth:  1950/09/30 Medical Record Number:  [id number]   Reason for Referral: stroke follow up    CHIEF COMPLAINT:  Chief Complaint  Patient presents with  . Follow-up    RM 14 alone Pt is well, no more strokes    HPI:  Today, 03/11/2020, Renee Rodriguez returns for 6361-month stroke follow-up.  Stable from stroke standpoint since prior visit without new stroke/TIA symptoms and denies residual stroke deficits.  She has since returned back to work without difficulty.  Remains on Plavix without bleeding or bruising.  Remains on atorvastatin 80 mg daily and Zetia 10 mg daily without side effects.  Blood pressure today initially elevated and on recheck 144/88 on hydrochlorothiazide 25 mg daily and losartan 50 mg daily.  Lisinopril discontinued by PCP due to having dry cough which resolved after stopping.  Recently obtained wrist cuff monitor to start routinely monitoring BP at home.  Loop recorder has not shown atrial fibrillation thus far. Anxiety has been stable on sertaline 25mg  AM and 50mg  PM per PCP. No further concerns at this time.   History provided for reference purposes only Update 10/28/2019 JM: Renee Rodriguez returns for follow-up visit Prior complaints of imbalance, worsening anxiety and memory loss with MRI showing subacute right corona radiata infarct that was not present on imaging in 03/2019 Imbalance has been greatly improving since prior visit She does report occasional dizziness type sensation especially after sitting for prolonged period of time or when getting out of bed in the morning Memory loss and anxiety have been greatly improving.  Occasional difficulty with anxiety. Increased sertraline to 50 mg nightly after prior visit with improvement and denies side effects. Denies new or worsening stroke/TIA symptoms Remains on clopidogrel  without bleeding or bruising.  Previously on aspirin but changed to clopidogrel due to recurrent stroke despite aspirin compliance Continues on atorvastatin 80 mg daily and Zetia 10 mg daily without side effects.  Zetia recently added due to continued elevated LDL at 131 which is above goal of 70 Blood pressure today 164/90, asymptomatic.  Does not routinely monitor at home Loop recorder has not shown atrial fibrillation thus far No further concerns at this time  Update 08/27/2019 JM: Renee Rodriguez returns for follow-up regarding cryptogenic left BG/CR stroke in 03/2019.  She reports over the past 3 weeks she started to experience imbalance where she feels unstable with ambulation and difficulty with stepping down from a curb.  Denies vertigo, dizziness or lightheadedness.  She did not experience the symptoms prior and reports completely recovered from prior stroke.  Denies weakness, numbness/tingling, speech difficulty or visual changes.  She also reports increased anxiety and worsening memory over the past few weeks.  She does report compliance with all prescribed medications. Continues on aspirin and atorvastatin 80 mg daily for secondary stroke prevention.  No recent lab work.  Blood pressure today 182/92, asymptomatic.  She has not taken antihypertensives yet this morning.  She does not routinely monitor at home.  Loop recorder is not shown atrial fibrillation thus far.  She was evaluated by Dr. Vickey Hugerohmeier for possible sleep apnea undergoing split-night study on 05/02/2019 which did not show any evidence of sleep apnea.  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  She was started on sertraline 25 mg daily due to anxiety related concerns by Dr.  Dohmeier.  No further concerns at this time.  Initial visit 04/09/2019 JM: Renee Rodriguez is a 70 year old female who is being seen today for hospital follow-up.  She has been recovering well from a stroke standpoint with only mild left  hand decreased fine motor control and occasional word finding difficulty.  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  She is questioning if she can return at this time.  She has completed 3 weeks DAPT and continues on aspirin alone without bleeding or bruising.  Continues on atorvastatin 80 mg daily and does complain of mild left calf pain and left side pain. She states initially feeling on right side cramping but now left. Cramping does not limit daily activity or functioning. She does have follow up with PCP in the near future and per patient, will have lab work obtained including lipid panel. Blood pressure today satisfactory at 122/80. Loop recorder has not shown atrial fibrillation thus far. Discussion regarding suspected sleep apnea during admission. Does admit to morning headaches, day time fatigue and insomnia. She has not previously underwent sleep study. She denies complete tobacco cessation. She continues to use THC occasionally but has been trying to decrease. Typically uses to help insomnia and anxiety. Loop recorder has not shown atrial fibrillation thus far. Denies new or worsening stroke/TIA symptoms.   Stroke admission 03/05/2019: Renee C Waldenis a 70 y.o.femalewith history of hypertension, GERD, arthritis and anemiawho presented on 03/05/2019 with R sided weakness and inability to get her words out. Evaluated by stroke team and Dr. Pearlean Brownie with stroke work-up revealing left basal ganglia and corona radiata infarcts as evidenced on MRI embolic pattern secondary to unknown source. NIHSS score 6 and CT head no evidence of hemorrhage therefore received tPA 03/05/2019 at 0840. In addition to acute infarcts, MRI also showed small vessel disease, old right thalamic lacune, microhemorrhage and right VA cervical artery occlusion.  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  Repeat CT head 24 hours post TPA no evidence of hemorrhage with evolving left basal ganglia and corona radiata infarct.  2D echo unremarkable.  TEE negative for cardiac source of embolus.  Loop recorder placed to evaluate for atrial fibrillation as etiology of recent stroke.  Recommended DAPT for 3 weeks and aspirin alone as she is not previously on antithrombotic.  History of HTN elevated upon presentation and treated with Cleviprex; noncompliance of prior home dose antihypertensive and recommended long-term pedal normotensive range.  LDL 200 previously prescribed atorvastatin 20 mg daily but reported noncompliance and recommended initiating atorvastatin 80 mg daily.  No evidence or history of DM with A1c 5.4.  UDS positive for THC.  Other stroke risk factors include advanced age, tobacco use, EtOH use, THC use, family history of stroke and possible OSA.  Discharged home in stable condition recommendation of outpatient therapy.  Stroke:L basal ganglia and corona radiatainfarct embolic secondary to unknownsource  Code Stroke CT head No acute abnormality. Small vessel disease.Old R thalamic infarct.ASPECTS 10.   CTA head & neckR VA origin occlusion w/ hypoplastic R V4. Mild ICA bifurcation atherosclerosis. Mild small vessel disease. Degenerative CS.  MRISmall posterior lentiform nucleus and corona radiata infarct >2cm in size. Small vessel disease. Old R thalamic lacune and micro hemorrhage. R VA cervical artery occlusion.   Repeat CT head 24h no  hemorrhage. evolving L basal ganglia and corona radiata infarct  2D EchoEF60-65%. No source of embolus. RA dilated.  TEE neg for embolic source.  implantable loop recorder placed 1/7 to evaluate for atrial fibrillation as etiology of stroke([place])  We will not perform a TCD bubble given age > 48,  LDL200  HgbA1c5.4  UDS positive THC  No  antithromboticprior to admission, started ib aspirin 81 mg and plavix 75 mg daily x 3 weeks, then aspirin alone.   Therapy recommendations:OP OT, no PT, no SLP  Disposition:return home      ROS:   14 system review of systems performed and negative with exception of no complaints PMH:  Past Medical History:  Diagnosis Date  . Anemia   . Arthritis   . GERD (gastroesophageal reflux disease)   . Hypertension     PSH:  Past Surgical History:  Procedure Laterality Date  . BUBBLE STUDY  03/07/2019   Procedure: BUBBLE STUDY;  Surgeon: Jake Bathe, MD;  Location[facility]COPY;  Service: Cardiovascular;;  . CESAREAN SECTION  08/1982  . LOOP RECORDER INSERTION N/A 03/07/2019   Procedure: LOOP RECORDER INSERTION;  Surgeon: Hillis Range, MD;  Location: [facility] INVASIVE CV LAB;  Service: Cardiovascular;  Laterality: N/A;  . TEE WITHOUT CARDIOVERSION N/A 03/07/2019   Procedure: TRANSESOPHAGEAL ECHOCARDIOGRAM (TEE);  Surgeon: Jake Bathe, MD;  Location: [facility];  Service: Cardiovascular;  Laterality: N/A;  . 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 TUBAL LIGATION  08/1982,01/1985    Social History:  Social History   Socioeconomic History  . Marital status: Legally Separated    Spouse name: Not on file  . Number of children: Not on file  . Years of education: Not on file  . Highest education level: Not on file  Occupational History  . Occupation: Building control surveyor  Tobacco Use  . Smoking status: Former Smoker    Types: Cigarettes    Quit date: 03/04/2019    Years since quitting: 1.0  . Smokeless tobacco: Never Used  Substance and Sexual Activity  . Alcohol use: Yes    Alcohol/week: 3.0 standard drinks    Types: 3 Standard drinks or equivalent per week    Comment: occasionally beer  . Drug  use: No  . Sexual activity: Not on file  Other Topics Concern  . Not on file  Social History Narrative  . Not on file   Social Determinants of Health   Financial Resource Strain: Not on file  Food Insecurity: Not on file  Transportation Needs: Not on file  Physical Activity: Not on file  Stress: Not on file  Social Connections: Not on file  Intimate Partner Violence: Not on file    Family History:  Family History  Problem Relation Age of Onset  . Stroke Mother        Cerebral hemorrhage  . Asthma Mother   . Alcohol abuse Mother   . Kidney disease Mother   . Hypertension Mother   . Cirrhosis Mother   . Hypertension Sister   . Colon cancer Neg Hx     Medications:   Current Outpatient Medications on File Prior to Visit  Medication Sig Dispense Refill  . atorvastatin (LIPITOR) 80 MG tablet Take 1 tablet (80 mg total) by mouth daily. 30 tablet 2  . clopidogrel (  PLAVIX) 75 MG tablet Take 1 tablet (75 mg total) by mouth daily. 90 tablet 3  . ezetimibe (ZETIA) 10 MG tablet Take 1 tablet (10 mg total) by mouth daily. 90 tablet 3  . hydrochlorothiazide (HYDRODIURIL) 25 MG tablet Take 1 tablet (25 mg total) by mouth daily. 30 tablet 2  . losartan (COZAAR) 50 MG tablet Take 50 mg by mouth daily.    . sertraline (ZOLOFT) 25 MG tablet Take 25 mg by mouth in the morning and at bedtime. 25mg  AM and 50mg  PM     No current facility-administered medications on file prior to visit.    Allergies:   Allergies  Allergen Reactions  . Shellfish Allergy Anaphylaxis     Physical Exam  Vitals:   03/11/20 0745  BP: (!) 144/88  Pulse: 79  Weight: 135 lb 9.6 oz (61.5 kg)  Height: 5\' 3"  (1.6 m)   Body mass index is 24.02 kg/m. No exam data present  General: well developed, well nourished, very pleasant elderly AA female, seated, in no evident distress Head: head normocephalic and atraumatic.   Neck: supple with no carotid or supraclavicular bruits Cardiovascular: regular rate and  rhythm, no murmurs Musculoskeletal: no deformity Skin:  no rash/petichiae Vascular:  Normal pulses all extremities   Neurologic Exam Mental Status: Awake and fully alert.   Fluent speech and language.  Oriented to place and time. Recent memory subjectively impaired and remote memory intact. Attention span, concentration and fund of knowledge appropriate during visit. Mood and affect appropriate.  Cranial Nerves: Pupils equal, briskly reactive to light. Extraocular movements full without nystagmus. Visual fields full to confrontation. Hearing intact. Facial sensation intact. Face, tongue, palate moves normally and symmetrically.  Motor: Normal bulk and tone. Normal strength in all tested extremity muscles  Sensory.:  Intact to light touch, vibratory and pinprick sensation in all tested extremities Coordination: Rapid alternating movements normal in all extremities. Finger-to-nose and heel-to-shin performed accurately bilaterally. Gait and Station: Arises from chair without difficulty. Stance is normal. Gait demonstrates normal stride length and balance without use of assistive device.  Difficulty performing tandem walk.  Romberg negative. Reflexes: 1+ and symmetric. Toes downgoing.        ASSESSMENT: BIRTTANY DECHELLIS is a 70 y.o. year old female presented with right-sided weakness and expressive aphasia on 03/05/2019 with stroke work-up revealing left basal ganglia and corona radiata infarcts s/p TPA secondary to unknown source.  Loop recorder placed for long-term monitoring of possible atrial fibrillation and stroke etiology.  Found to have subacute right corona radiata infarct on imaging in 09/2019 with complaints of imbalance, worsening anxiety and memory loss.  Vascular risk factors include HTN, HLD, tobacco use, THC use, EtOH use, and hx of medication noncompliance.  Underwent sleep study which was negative for sleep apnea.     PLAN:  1. R CR stroke 2. L BG and CR stroke,  cryptogenic a. Recovered well without residual deficit b. Continue Plavix and atorvastatin 80 mg daily and Zetia 10 mg daily for secondary stroke prevention c. Loop recorder has not shown atrial fibrillation thus far -monthly reports personally reviewed d. Discussed secondary stroke prevention and importance of close PCP follow-up for aggressive stroke risk factor management 3. HTN:  a. BP goal <130/90.  Slightly elevated on hydrochlorothiazide 25 mg daily and losartan 50 mg daily per PCP.  Educated on use of wrist cuff monitor and encouraged monitoring at home with recording and to ensure close PCP follow-up 4. HLD:  a. LDL goal<70.  On atorvastatin 80 mg daily and Zetia 10 mg daily. Unable to recheck lipid panel today as patient not fasting - advised to schedule f/u with PCP for lab work 5. Anxiety: a. Overall stable on sertraline per PCP   Follow up in 6 months or call earlier if needed   CC:  [facility] provider: Dr. Sheppard CoilSethi Wile, Nyoka CowdenLaura H, MD    I spent 30 minutes of face-to-face and non-face-to-face time with patient.  This included previsit chart review, lab review, study review, order entry, electronic health record documentation, patient education regarding recurrent stroke and prior stroke with resolution of prior deficits, importance of managing stroke risk factors, and answered all other questions to patient satisfaction   Ihor AustinJessica McCue, [facility] LLCGNP-BC  [facility] [address] [place], [place] [id number]  Phone [phone] Fax [phone] Note: This document was prepared with digital dictation and possible smart phrase technology. Any transcriptional errors that result from this process are unintentional.

## 2020-03-11 NOTE — Progress Notes (Signed)
I agree with the above plan 

## 2020-03-11 NOTE — Patient Instructions (Signed)
Continue clopidogrel 75 mg daily  and atorvastatin and Zetia for secondary stroke prevention  Continue to follow up with PCP regarding cholesterol and blood pressure management - schedule a follow up with your PCP to obtain lab work including cholesterol - please have them fax the results to our office  Maintain strict control of hypertension with blood pressure goal below 130/90 and cholesterol with LDL cholesterol (bad cholesterol) goal below 70 mg/dL.      Followup in the future with me in 6 months or call earlier if needed       Thank you for coming to see [place] at [facility]. I hope we have been able to provide you high quality care today.  You may receive a patient satisfaction survey over the next few weeks. We would appreciate your feedback and comments so that we may continue to improve ourselves and the health of our patients.

## 2020-03-16 ENCOUNTER — Ambulatory Visit (INDEPENDENT_AMBULATORY_CARE_PROVIDER_SITE_OTHER): Payer: Medicare Other

## 2020-03-16 DIAGNOSIS — I639 Cerebral infarction, unspecified: Secondary | ICD-10-CM

## 2020-03-18 LAB — CUP PACEART REMOTE DEVICE CHECK
Date Time Interrogation Session: 20220115230443
Implantable Pulse Generator Implant Date: 20210107

## 2020-03-30 NOTE — Progress Notes (Signed)
Carelink Summary Report / Loop Recorder 

## 2020-04-17 LAB — CUP PACEART REMOTE DEVICE CHECK
Date Time Interrogation Session: 20220217230532
Implantable Pulse Generator Implant Date: 20210107

## 2020-04-20 ENCOUNTER — Ambulatory Visit (INDEPENDENT_AMBULATORY_CARE_PROVIDER_SITE_OTHER): Payer: Medicare Other

## 2020-04-20 DIAGNOSIS — I639 Cerebral infarction, unspecified: Secondary | ICD-10-CM | POA: Diagnosis not present

## 2020-04-23 NOTE — Progress Notes (Signed)
Carelink Summary Report / Loop Recorder 

## 2020-05-13 ENCOUNTER — Encounter (INDEPENDENT_AMBULATORY_CARE_PROVIDER_SITE_OTHER): Payer: Self-pay

## 2020-05-20 ENCOUNTER — Other Ambulatory Visit: Payer: Self-pay

## 2020-05-20 ENCOUNTER — Encounter (INDEPENDENT_AMBULATORY_CARE_PROVIDER_SITE_OTHER): Payer: Self-pay | Admitting: Ophthalmology

## 2020-05-20 ENCOUNTER — Ambulatory Visit (INDEPENDENT_AMBULATORY_CARE_PROVIDER_SITE_OTHER): Payer: Medicare Other | Admitting: Ophthalmology

## 2020-05-20 DIAGNOSIS — H21521 Goniosynechiae, right eye: Secondary | ICD-10-CM | POA: Diagnosis not present

## 2020-05-20 DIAGNOSIS — H35351 Cystoid macular degeneration, right eye: Secondary | ICD-10-CM | POA: Diagnosis not present

## 2020-05-20 DIAGNOSIS — H43821 Vitreomacular adhesion, right eye: Secondary | ICD-10-CM

## 2020-05-20 DIAGNOSIS — H21541 Posterior synechiae (iris), right eye: Secondary | ICD-10-CM | POA: Insufficient documentation

## 2020-05-20 DIAGNOSIS — H2512 Age-related nuclear cataract, left eye: Secondary | ICD-10-CM | POA: Insufficient documentation

## 2020-05-20 NOTE — Progress Notes (Signed)
05/20/2020     CHIEF COMPLAINT Patient presents for Eye Exam (Pt referred by Dr. Melene Muller for CME OD after cat sx./Pt states vision is not as good as it should be. Denies FOL and floaters. Using gtts as directed, once daily in AM)   HISTORY OF PRESENT ILLNESS: Renee Rodriguez is a 70 y.o. female who presents to the clinic today for:   HPI    Eye Exam    In right eye.  Characterized as blurry vision.  Severity is moderate.  I, the attending physician,  performed the HPI with the patient and updated documentation appropriately. Additional comments: Pt referred by Dr. Melene Muller for CME OD after cat sx. Pt states vision is not as good as it should be. Denies FOL and floaters. Using gtts as directed, once daily in AM          Comments    Patient reports having cataract surgery September 2021 and has had some blurred vision is slowly improving particularly over the last week on this new medication PROLENSA once daily       Last edited by Edmon Crape, MD on 05/20/2020 10:52 AM. (History)      Referring physician: Chalmers Guest, MD [address] [place],  [place] [address]  HISTORICAL INFORMATION:   Selected notes from the MEDICAL RECORD NUMBER    Lab Results  Component Value Date   HGBA1C 5.5 08/27/2019     CURRENT MEDICATIONS: No current outpatient medications on file. (Ophthalmic Drugs)   No current facility-administered medications for this visit. (Ophthalmic Drugs)   Current Outpatient Medications (Other)  Medication Sig  . atorvastatin (LIPITOR) 80 MG tablet Take 1 tablet (80 mg total) by mouth daily.  . clopidogrel (PLAVIX) 75 MG tablet Take 1 tablet (75 mg total) by mouth daily.  Marland Kitchen ezetimibe (ZETIA) 10 MG tablet Take 1 tablet (10 mg total) by mouth daily.  . hydrochlorothiazide (HYDRODIURIL) 25 MG tablet Take 1 tablet (25 mg total) by mouth daily.  Marland Kitchen losartan (COZAAR) 50 MG tablet Take 50 mg by mouth daily.  . sertraline (ZOLOFT) 25 MG tablet Take 25 mg  by mouth in the morning and at bedtime. 25mg  AM and 50mg  PM   No current facility-administered medications for this visit. (Other)      REVIEW OF SYSTEMS:    ALLERGIES Allergies  Allergen Reactions  . Shellfish Allergy Anaphylaxis    PAST MEDICAL HISTORY Past Medical History:  Diagnosis Date  . Anemia   . Arthritis   . GERD (gastroesophageal reflux disease)   . Hypertension    Past Surgical History:  Procedure Laterality Date  . BUBBLE STUDY  03/07/2019   Procedure: BUBBLE STUDY;  Surgeon: , MD;  Location: [facility] ENDOSCOPY;  Service: Cardiovascular;;  . CESAREAN SECTION  08/1982  . LOOP RECORDER INSERTION N/A 03/07/2019   Procedure: LOOP RECORDER INSERTION;  Surgeon: 09/1982, MD;  Location: [facility] INVASIVE CV LAB;  Service: Cardiovascular;  Laterality: N/A;  . TEE WITHOUT CARDIOVERSION N/A 03/07/2019   Procedure: TRANSESOPHAGEAL ECHOCARDIOGRAM (TEE);  Surgeon: Hillis Range, MD;  Location: [facility] ENDOSCOPY;  Service: Cardiovascular;  Laterality: N/A;  . 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 TUBAL LIGATION  08/1982,01/1985    FAMILY HISTORY Family History  Problem Relation Age of  Onset  . Stroke Mother        Cerebral hemorrhage  . Asthma Mother   . Alcohol abuse Mother   . Kidney disease Mother   . Hypertension Mother   . Cirrhosis Mother   . Hypertension Sister   . Colon cancer Neg Hx     SOCIAL HISTORY Social History   Tobacco Use  . Smoking status: Former Smoker    Types: Cigarettes    Quit date: 03/04/2019    Years since quitting: 1.2  . Smokeless tobacco: Never Used  Substance Use Topics  . Alcohol use: Yes    Alcohol/week: 3.0 standard drinks    Types: 3 Standard drinks or equivalent per week    Comment: occasionally beer  . Drug use: No         OPHTHALMIC  EXAM: Base Eye Exam    Visual Acuity (Snellen - Linear)      Right Left   Dist cc 20/30 -1 20/30   Correction: Glasses       Tonometry (Tonopen, 9:24 AM)      Right Left   Pressure 13 14       Pupils      Pupils Dark Light Shape React APD   Right PERRL 5 4 Irregular Brisk None   Left PERRL 4 3 Round Brisk None       Visual Fields (Counting fingers)      Left Right    Full Full       Neuro/Psych    Oriented x3: Yes   Mood/Affect: Normal       Dilation    Both eyes: 1.0% Mydriacyl, 2.5% Phenylephrine @ 9:24 AM        Slit Lamp and Fundus Exam    External Exam      Right Left   External Normal Normal       Slit Lamp Exam      Right Left   Lids/Lashes Normal Normal   Conjunctiva/Sclera White and quiet White and quiet   Cornea Clear Clear   Anterior Chamber Deep and quiet Deep and quiet   Iris Patent PI inferotemporal, small PAS to the capsule at 7 meridian Round and reactive   Lens Centered posterior chamber intraocular lens 3+ Nuclear sclerosis   Anterior Vitreous Normal Normal       Fundus Exam      Right Left   Posterior Vitreous Normal Normal   Disc Normal Normal   C/D Ratio 0.35 0.35   Macula Dull foveal reflex, possible early CME Dull foveal reflex, possible early CME   Vessels Normal Normal   Periphery Normal Normal          IMAGING AND PROCEDURES  Imaging and Procedures for 05/20/20  OCT, Retina - OU - Both Eyes       Right Eye Quality was good. Scan locations included subfoveal. Central Foveal Thickness: 362. Progression has no prior data. Findings include vitreomacular adhesion , vitreous traction, cystoid macular edema.   Left Eye Quality was good. Scan locations included subfoveal. Central Foveal Thickness: 302. Progression has no prior data. Findings include vitreomacular adhesion .   Notes Small Subfoveal serous retinal detachment OD, likely CME associated yet possible some role of vitreomacular adhesion/traction.                 ASSESSMENT/PLAN:  Cystoid macular edema of right eye The nature of cystoid macular edema including causes, exacerbating factors, and treatments including steroid and nonsteroidal anti-inflammatory drops, periocular  injections of steroids, and intravitreal injections of steroids were reviewed. An informational brochure was offered.  The patient's questions were answered. The potential side effects of the various treatments were reviewed including the potential for intraocular pressure rise with steroid treatments.  I often use topical NSAIDS alone or in conjunction with periocular steroids to resolve this condition.   Patient instructed not to compress or "rub" on the eye.  More rarely, surgery may be considered if the condition is not improving.  I agree with the steps taken by Dr. Harlon Flor to use topical PROLENSA once daily.  Patient says coincidently her vision does seem to be improving.  I would suggest continued use this once daily for the next 6 weeks to see if we can resolve these levels pockets of swelling in and around the center of the vision.  There may be a role vitreomacular adhesion although this is indeterminate this time while CME is present  Vitreomacular adhesion of right eye Likely not contributing to the CME but we will continue to monitor      ICD-10-CM   1. Cystoid macular edema of right eye  H35.351 OCT, Retina - OU - Both Eyes  2. Vitreomacular adhesion of right eye  H43.821 OCT, Retina - OU - Both Eyes  3. Cataract, nuclear sclerotic, left eye  H25.12     1.  Pseudophakic CME of the right eye.  Likely improved as patient reports improvement in her acuity after recent commencement of use PROLENSA once daily.  She reports having been given a sample of this.  I will provide her with 1 more bottle of the sample of PROLENSA to use once daily to the right eye.  2.  No signs of other maculopathy.  I do recommend continued use of the PROLENSA once daily to further  resolve the swelling.  I will explained to the patient that if it does not resolve other things can be undertaken including potential for surgical release of the vitreomacular adhesion.  I do not believe this is likely  to be needed  3.  Nuclear sclerotic cataract left eye follow-up with Dr. Harlon Flor as scheduled  Ophthalmic Meds Ordered this visit:  No orders of the defined types were placed in this encounter.      Return in about 7 weeks (around 07/08/2020) for OCT, OD, dilate.  There are no Patient Instructions on file for this visit.   Explained the diagnoses, plan, and follow up with the patient and they expressed understanding.  Patient expressed understanding of the importance of proper follow up care.   Alford Highland Rankin M.D. Diseases & Surgery of the Retina and Vitreous [facility] 05/20/20     Abbreviations: M myopia (nearsighted); A astigmatism; H hyperopia (farsighted); P presbyopia; Mrx spectacle prescription;  CTL contact lenses; OD right eye; OS left eye; OU both eyes  XT exotropia; ET esotropia; PEK punctate epithelial keratitis; PEE punctate epithelial erosions; DES dry eye syndrome; MGD meibomian gland dysfunction; ATs artificial tears; PFAT's preservative free artificial tears; NSC nuclear sclerotic cataract; PSC posterior subcapsular cataract; ERM epi-retinal membrane; PVD posterior vitreous detachment; RD retinal detachment; DM diabetes mellitus; DR diabetic retinopathy; NPDR non-proliferative diabetic retinopathy; PDR proliferative diabetic retinopathy; CSME clinically significant macular edema; DME diabetic macular edema; dbh dot blot hemorrhages; CWS cotton wool spot; POAG primary open angle glaucoma; C/D cup-to-disc ratio; HVF humphrey visual field; GVF goldmann visual field; OCT optical coherence tomography; IOP intraocular pressure; BRVO Branch retinal vein occlusion; CRVO central  retinal vein occlusion; CRAO central retinal artery occlusion; BRAO  branch retinal artery occlusion; RT retinal tear; SB scleral buckle; PPV pars plana vitrectomy; VH Vitreous hemorrhage; PRP panretinal laser photocoagulation; IVK intravitreal kenalog; VMT vitreomacular traction; MH Macular hole;  NVD neovascularization of the disc; NVE neovascularization elsewhere; AREDS age related eye disease study; ARMD age related macular degeneration; POAG primary open angle glaucoma; EBMD epithelial/anterior basement membrane dystrophy; ACIOL anterior chamber intraocular lens; IOL intraocular lens; PCIOL posterior chamber intraocular lens; Phaco/IOL phacoemulsification with intraocular lens placement; PRK photorefractive keratectomy; LASIK laser assisted in situ keratomileusis; HTN hypertension; DM diabetes mellitus; COPD chronic obstructive pulmonary disease

## 2020-05-20 NOTE — Assessment & Plan Note (Signed)
Observe this condition

## 2020-05-20 NOTE — Assessment & Plan Note (Signed)
Likely not contributing to the CME but we will continue to monitor

## 2020-05-20 NOTE — Assessment & Plan Note (Signed)
The nature of cystoid macular edema including causes, exacerbating factors, and treatments including steroid and nonsteroidal anti-inflammatory drops, periocular injections of steroids, and intravitreal injections of steroids were reviewed. An informational brochure was offered.  The patient's questions were answered. The potential side effects of the various treatments were reviewed including the potential for intraocular pressure rise with steroid treatments.  I often use topical NSAIDS alone or in conjunction with periocular steroids to resolve this condition.   Patient instructed not to compress or "rub" on the eye.  More rarely, surgery may be considered if the condition is not improving.  I agree with the steps taken by Dr. Harlon Flor to use topical PROLENSA once daily.  Patient says coincidently her vision does seem to be improving.  I would suggest continued use this once daily for the next 6 weeks to see if we can resolve these levels pockets of swelling in and around the center of the vision.  There may be a role vitreomacular adhesion although this is indeterminate this time while CME is present

## 2020-05-24 LAB — CUP PACEART REMOTE DEVICE CHECK
Date Time Interrogation Session: 20220322230302
Implantable Pulse Generator Implant Date: 20210107

## 2020-05-25 ENCOUNTER — Ambulatory Visit (INDEPENDENT_AMBULATORY_CARE_PROVIDER_SITE_OTHER): Payer: Medicare Other

## 2020-05-25 DIAGNOSIS — I639 Cerebral infarction, unspecified: Secondary | ICD-10-CM

## 2020-06-08 NOTE — Progress Notes (Signed)
Carelink Summary Report / Loop Recorder 

## 2020-06-22 ENCOUNTER — Ambulatory Visit (INDEPENDENT_AMBULATORY_CARE_PROVIDER_SITE_OTHER): Payer: Medicare Other

## 2020-06-22 DIAGNOSIS — I639 Cerebral infarction, unspecified: Secondary | ICD-10-CM | POA: Diagnosis not present

## 2020-06-23 LAB — CUP PACEART REMOTE DEVICE CHECK
Date Time Interrogation Session: 20220424230613
Implantable Pulse Generator Implant Date: 20210107

## 2020-07-08 ENCOUNTER — Encounter (INDEPENDENT_AMBULATORY_CARE_PROVIDER_SITE_OTHER): Payer: Self-pay | Admitting: Ophthalmology

## 2020-07-08 ENCOUNTER — Ambulatory Visit (INDEPENDENT_AMBULATORY_CARE_PROVIDER_SITE_OTHER): Payer: Medicare Other | Admitting: Ophthalmology

## 2020-07-08 ENCOUNTER — Other Ambulatory Visit: Payer: Self-pay

## 2020-07-08 DIAGNOSIS — H35351 Cystoid macular degeneration, right eye: Secondary | ICD-10-CM | POA: Diagnosis not present

## 2020-07-08 DIAGNOSIS — H21541 Posterior synechiae (iris), right eye: Secondary | ICD-10-CM

## 2020-07-08 DIAGNOSIS — H2512 Age-related nuclear cataract, left eye: Secondary | ICD-10-CM | POA: Diagnosis not present

## 2020-07-08 DIAGNOSIS — H43821 Vitreomacular adhesion, right eye: Secondary | ICD-10-CM | POA: Diagnosis not present

## 2020-07-08 NOTE — Progress Notes (Signed)
07/08/2020     CHIEF COMPLAINT Patient presents for Retina Follow Up (7wk fu OD/OCT/Pt states," I feel like I can see a little better. I have not had any problems."/Pt reports that she is using Prolensa Qday OD)   HISTORY OF PRESENT ILLNESS: Renee Rodriguez is a 70 y.o. female who presents to the clinic today for:   HPI    Retina Follow Up    Diagnosis: Other   Laterality: right eye   Onset: 7 weeks ago   Severity: mild   Duration: 7 weeks   Course: gradually improving   Comments: 7wk fu OD/OCT Pt states," I feel like I can see a little better. I have not had any problems." Pt reports that she is using Prolensa Qday OD          Comments    Patient has been extremely compliant using Prolensa topically sample provided prior.  Using this drop once daily to the right eye.  Only a small amount remains       Last edited by Edmon Crape, MD on 07/08/2020 10:11 AM. (History)      Referring physician: Melida Quitter, MD [address] [place],  [place] [address]  HISTORICAL INFORMATION:   Selected notes from the MEDICAL RECORD NUMBER    Lab Results  Component Value Date   HGBA1C 5.5 08/27/2019     CURRENT MEDICATIONS: No current outpatient medications on file. (Ophthalmic Drugs)   No current facility-administered medications for this visit. (Ophthalmic Drugs)   Current Outpatient Medications (Other)  Medication Sig  . atorvastatin (LIPITOR) 80 MG tablet Take 1 tablet (80 mg total) by mouth daily.  . clopidogrel (PLAVIX) 75 MG tablet Take 1 tablet (75 mg total) by mouth daily.  Marland Kitchen ezetimibe (ZETIA) 10 MG tablet Take 1 tablet (10 mg total) by mouth daily.  . hydrochlorothiazide (HYDRODIURIL) 25 MG tablet Take 1 tablet (25 mg total) by mouth daily.  Marland Kitchen losartan (COZAAR) 50 MG tablet Take 50 mg by mouth daily.  . sertraline (ZOLOFT) 25 MG tablet Take 25 mg by mouth in the morning and at bedtime. 25mg  AM and 50mg  PM   No current facility-administered medications for this visit.  (Other)      REVIEW OF SYSTEMS:    ALLERGIES Allergies  Allergen Reactions  . Shellfish Allergy Anaphylaxis    PAST MEDICAL HISTORY Past Medical History:  Diagnosis Date  . Anemia   . Arthritis   . GERD (gastroesophageal reflux disease)   . Hypertension    Past Surgical History:  Procedure Laterality Date  . BUBBLE STUDY  03/07/2019   Procedure: BUBBLE STUDY;  Surgeon: , MD;  Location: [facility] ENDOSCOPY;  Service: Cardiovascular;;  . CESAREAN SECTION  08/1982  . LOOP RECORDER INSERTION N/A 03/07/2019   Procedure: LOOP RECORDER INSERTION;  Surgeon: 09/1982, MD;  Location: [facility] INVASIVE CV LAB;  Service: Cardiovascular;  Laterality: N/A;  . TEE WITHOUT CARDIOVERSION N/A 03/07/2019   Procedure: TRANSESOPHAGEAL ECHOCARDIOGRAM (TEE);  Surgeon: Hillis Range, MD;  Location: [facility] ENDOSCOPY;  Service: Cardiovascular;  Laterality: N/A;  . 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 TUBAL LIGATION  08/1982,01/1985    FAMILY HISTORY Family History  Problem Relation Age of Onset  . Stroke Mother  Cerebral hemorrhage  . Asthma Mother   . Alcohol abuse Mother   . Kidney disease Mother   . Hypertension Mother   . Cirrhosis Mother   . Hypertension Sister   . Colon cancer Neg Hx     SOCIAL HISTORY Social History   Tobacco Use  . Smoking status: Former Smoker    Types: Cigarettes    Quit date: 03/04/2019    Years since quitting: 1.3  . Smokeless tobacco: Never Used  Substance Use Topics  . Alcohol use: Yes    Alcohol/week: 3.0 standard drinks    Types: 3 Standard drinks or equivalent per week    Comment: occasionally beer  . Drug use: No         OPHTHALMIC EXAM: Base Eye Exam    Visual Acuity (ETDRS)      Right Left   Dist cc 20/30 20/25 -1   Dist ph cc NI        Tonometry  (Tonopen, 9:33 AM)      Right Left   Pressure 20 21       Pupils      Pupils Dark Light Shape React APD   Right PERRL 5 4 Irregular Brisk None   Left PERRL 4 3 Round Brisk None       Visual Fields (Counting fingers)      Left Right    Full Full       Extraocular Movement      Right Left    Full Full       Neuro/Psych    Oriented x3: Yes   Mood/Affect: Normal       Dilation    Right eye: 1.0% Mydriacyl, 2.5% Phenylephrine @ 9:33 AM        Slit Lamp and Fundus Exam    External Exam      Right Left   External Normal Normal       Slit Lamp Exam      Right Left   Lids/Lashes Normal Normal   Conjunctiva/Sclera White and quiet White and quiet   Cornea Clear Clear   Anterior Chamber Deep and quiet Deep and quiet   Iris Patent PI inferotemporal, small PAS to the capsule at 7 meridian Round and reactive   Lens Centered posterior chamber intraocular lens 3+ Nuclear sclerosis   Anterior Vitreous Normal Normal       Fundus Exam      Right Left   Posterior Vitreous Normal    Disc Normal    C/D Ratio 0.35    Macula Dull foveal reflex, no active CME Normal   Vessels Normal    Periphery Normal           IMAGING AND PROCEDURES  Imaging and Procedures for 07/08/20  OCT, Retina - OU - Both Eyes       Right Eye Quality was good. Scan locations included subfoveal. Central Foveal Thickness: 330. Progression has improved. Findings include vitreomacular adhesion .   Left Eye Quality was good. Scan locations included subfoveal. Central Foveal Thickness: 309. Progression has been stable. Findings include vitreomacular adhesion .   Notes OD vastly improved CME on topical NSAID, no physiologic impact from physiologic VMA  No residual CME                  ASSESSMENT/PLAN:  Cataract, nuclear sclerotic, left eye Follow-up as per Dr. Harlon Flor left eye  Posterior synechiae (iris), right eye Currently minor posterior synechiae, not likely causative of CME  at this time will observe  Vitreomacular adhesion of right eye Physiologic OD, not pathologic  Cystoid macular edema of right eye CME resolved on Prolensa 1 drop daily alone.  Patient nearly completing her current therapeutic bottle provided.  Will attempt to monitor this condition off therapy to see and determine if CME will recur      ICD-10-CM   1. Cystoid macular edema of right eye  H35.351 OCT, Retina - OU - Both Eyes  2. Cataract, nuclear sclerotic, left eye  H25.12   3. Posterior synechiae (iris), right eye  H21.541   4. Vitreomacular adhesion of right eye  H43.821     1.  Patient instructed she may complete the current residual bottle of Prolensa once daily to the right eye to confirm resolution and maintain resolution of CME.  However upon follow-up in scheduled 6 weeks, I would like the right eye to not receive any topical medications for 4 weeks to determine whether the CME is reoccurring.  2.  3.  Ophthalmic Meds Ordered this visit:  No orders of the defined types were placed in this encounter.      Return in about 6 weeks (around 08/19/2020) for dilate, OD, OCT.  There are no Patient Instructions on file for this visit.   Explained the diagnoses, plan, and follow up with the patient and they expressed understanding.  Patient expressed understanding of the importance of proper follow up care.   Alford Highland Demontay Grantham M.D. Diseases & Surgery of the Retina and Vitreous [facility] 07/08/20     Abbreviations: M myopia (nearsighted); A astigmatism; H hyperopia (farsighted); P presbyopia; Mrx spectacle prescription;  CTL contact lenses; OD right eye; OS left eye; OU both eyes  XT exotropia; ET esotropia; PEK punctate epithelial keratitis; PEE punctate epithelial erosions; DES dry eye syndrome; MGD meibomian gland dysfunction; ATs artificial tears; PFAT's preservative free artificial tears; NSC nuclear sclerotic cataract; PSC posterior subcapsular cataract;  ERM epi-retinal membrane; PVD posterior vitreous detachment; RD retinal detachment; DM diabetes mellitus; DR diabetic retinopathy; NPDR non-proliferative diabetic retinopathy; PDR proliferative diabetic retinopathy; CSME clinically significant macular edema; DME diabetic macular edema; dbh dot blot hemorrhages; CWS cotton wool spot; POAG primary open angle glaucoma; C/D cup-to-disc ratio; HVF humphrey visual field; GVF goldmann visual field; OCT optical coherence tomography; IOP intraocular pressure; BRVO Branch retinal vein occlusion; CRVO central retinal vein occlusion; CRAO central retinal artery occlusion; BRAO branch retinal artery occlusion; RT retinal tear; SB scleral buckle; PPV pars plana vitrectomy; VH Vitreous hemorrhage; PRP panretinal laser photocoagulation; IVK intravitreal kenalog; VMT vitreomacular traction; MH Macular hole;  NVD neovascularization of the disc; NVE neovascularization elsewhere; AREDS age related eye disease study; ARMD age related macular degeneration; POAG primary open angle glaucoma; EBMD epithelial/anterior basement membrane dystrophy; ACIOL anterior chamber intraocular lens; IOL intraocular lens; PCIOL posterior chamber intraocular lens; Phaco/IOL phacoemulsification with intraocular lens placement; PRK photorefractive keratectomy; LASIK laser assisted in situ keratomileusis; HTN hypertension; DM diabetes mellitus; COPD chronic obstructive pulmonary disease

## 2020-07-08 NOTE — Assessment & Plan Note (Signed)
Physiologic OD, not pathologic

## 2020-07-08 NOTE — Assessment & Plan Note (Signed)
CME resolved on Prolensa 1 drop daily alone.  Patient nearly completing her current therapeutic bottle provided.  Will attempt to monitor this condition off therapy to see and determine if CME will recur

## 2020-07-08 NOTE — Assessment & Plan Note (Addendum)
Currently minor posterior synechiae, not likely causative of CME at this time will observe

## 2020-07-08 NOTE — Assessment & Plan Note (Signed)
Follow-up as per Dr. Harlon Flor left eye

## 2020-07-09 NOTE — Progress Notes (Signed)
Carelink Summary Report / Loop Recorder 

## 2020-07-27 LAB — CUP PACEART REMOTE DEVICE CHECK
Date Time Interrogation Session: 20220527230338
Implantable Pulse Generator Implant Date: 20210107

## 2020-07-28 ENCOUNTER — Ambulatory Visit (INDEPENDENT_AMBULATORY_CARE_PROVIDER_SITE_OTHER): Payer: Medicare Other

## 2020-07-28 DIAGNOSIS — I639 Cerebral infarction, unspecified: Secondary | ICD-10-CM | POA: Diagnosis not present

## 2020-08-10 ENCOUNTER — Other Ambulatory Visit: Payer: Self-pay | Admitting: Adult Health

## 2020-08-19 ENCOUNTER — Ambulatory Visit (INDEPENDENT_AMBULATORY_CARE_PROVIDER_SITE_OTHER): Payer: Medicare Other | Admitting: Ophthalmology

## 2020-08-19 ENCOUNTER — Encounter (INDEPENDENT_AMBULATORY_CARE_PROVIDER_SITE_OTHER): Payer: Self-pay | Admitting: Ophthalmology

## 2020-08-19 ENCOUNTER — Other Ambulatory Visit: Payer: Self-pay

## 2020-08-19 DIAGNOSIS — H21541 Posterior synechiae (iris), right eye: Secondary | ICD-10-CM | POA: Diagnosis not present

## 2020-08-19 DIAGNOSIS — H35351 Cystoid macular degeneration, right eye: Secondary | ICD-10-CM

## 2020-08-19 DIAGNOSIS — H43821 Vitreomacular adhesion, right eye: Secondary | ICD-10-CM

## 2020-08-19 NOTE — Assessment & Plan Note (Signed)
Stable, no CME induced at this time

## 2020-08-19 NOTE — Assessment & Plan Note (Signed)
No recurrence of CME off topical NSAID for 1 month will observe  Follow-up with Dr. Chalmers Guest as scheduled

## 2020-08-19 NOTE — Progress Notes (Signed)
08/19/2020     CHIEF COMPLAINT Patient presents for Retina Follow Up (6 week fu OD and OCT/Pt states, "I have not had any problems. I think that my vision may have actually gotten a little better.")   HISTORY OF PRESENT ILLNESS: Renee Rodriguez is a 70 y.o. female who presents to the clinic today for:   HPI     Retina Follow Up           Diagnosis: Other   Laterality: right eye   Onset: 6 weeks ago   Severity: mild   Duration: 6 weeks   Course: gradually improving   Comments: 6 week fu OD and OCT Pt states, "I have not had any problems. I think that my vision may have actually gotten a little better."       Last edited by Demetrios Loll, COA on 08/19/2020  9:43 AM.      Referring physician: Melida Quitter, MD [address] [place],  [place] [address]  HISTORICAL INFORMATION:   Selected notes from the MEDICAL RECORD NUMBER    Lab Results  Component Value Date   HGBA1C 5.5 08/27/2019     CURRENT MEDICATIONS: No current outpatient medications on file. (Ophthalmic Drugs)   No current facility-administered medications for this visit. (Ophthalmic Drugs)   Current Outpatient Medications (Other)  Medication Sig   atorvastatin (LIPITOR) 80 MG tablet Take 1 tablet (80 mg total) by mouth daily.   clopidogrel (PLAVIX) 75 MG tablet Take 1 tablet (75 mg total) by mouth daily.   ezetimibe (ZETIA) 10 MG tablet Take 1 tablet (10 mg total) by mouth daily.   hydrochlorothiazide (HYDRODIURIL) 25 MG tablet Take 1 tablet (25 mg total) by mouth daily.   losartan (COZAAR) 50 MG tablet Take 50 mg by mouth daily.   sertraline (ZOLOFT) 25 MG tablet Take 25 mg by mouth in the morning and at bedtime. 25mg  AM and 50mg  PM   No current facility-administered medications for this visit. (Other)      REVIEW OF SYSTEMS:    ALLERGIES Allergies  Allergen Reactions   Shellfish Allergy Anaphylaxis    PAST MEDICAL HISTORY Past Medical History:  Diagnosis Date   Anemia    Arthritis     GERD (gastroesophageal reflux disease)    Hypertension    Past Surgical History:  Procedure Laterality Date   BUBBLE STUDY  03/07/2019   Procedure: BUBBLE STUDY;  Surgeon: , MD;  Location: [facility] END[facility];  Service: Cardiovascular;;   CESAREAN SECTION  08/1982   LOOP RECORDER INSERTION N/A 03/07/2019   Procedure: LOOP RECORDER INSERTION;  Surgeon: 09/1982, MD;  Location: [facility] INVASIVE CV LAB;  Service: Cardiovascular;  Laterality: N/A;   TEE WITHOUT CARDIOVERSION N/A 03/07/2019   Procedure: TRANSESOPHAGEAL ECHOCARDIOGRAM (TEE);  Surgeon: Hillis Range, MD;  Location: [facility] ENDOSCOPY;  Service: Cardiovascular;  Laterality: N/A;   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   Dr [facility]   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   Procedure: TOTAL KNEE ARTHROPLASTY;  Surgeon: Sherlean Foot, MD;  Location: [facility] OR;  Service: Orthopedics;  Laterality: Left;   TUBAL LIGATION  08/1982,01/1985    FAMILY HISTORY Family History  Problem Relation Age of Onset   Stroke Mother        Cerebral hemorrhage   Asthma Mother    Alcohol abuse Mother    Kidney disease Mother    Hypertension Mother    Cirrhosis Mother    Hypertension Sister    Colon  cancer Neg Hx     SOCIAL HISTORY Social History   Tobacco Use   Smoking status: Former    Pack years: 0.00    Types: Cigarettes    Quit date: 03/04/2019    Years since quitting: 1.4   Smokeless tobacco: Never  Substance Use Topics   Alcohol use: Yes    Alcohol/week: 3.0 standard drinks    Types: 3 Standard drinks or equivalent per week    Comment: occasionally beer   Drug use: No         OPHTHALMIC EXAM:  Base Eye Exam     Visual Acuity (ETDRS)       Right Left   Dist cc 20/30 +1 20/25 -2   Dist ph cc NI     Correction: Glasses         Tonometry (Tonopen, 9:46 AM)       Right Left   Pressure 15 13         Pupils       Pupils Dark Light Shape React APD   Right PERRL 5 4 Irregular Brisk None   Left PERRL 4 3 Round Brisk  None         Visual Fields (Counting fingers)       Left Right    Full Full         Extraocular Movement       Right Left    Full Full         Neuro/Psych     Oriented x3: Yes   Mood/Affect: Normal         Dilation     Right eye: 1.0% Mydriacyl, 2.5% Phenylephrine @ 9:46 AM           Slit Lamp and Fundus Exam     External Exam       Right Left   External Normal Normal         Slit Lamp Exam       Right Left   Lids/Lashes Normal Normal   Conjunctiva/Sclera White and quiet White and quiet   Cornea Clear Clear   Anterior Chamber Deep and quiet Deep and quiet   Iris Patent PI inferotemporal, small PAS to the capsule at 7 meridian Round and reactive   Lens Centered posterior chamber intraocular lens 3+ Nuclear sclerosis   Anterior Vitreous Normal Normal         Fundus Exam       Right Left   Posterior Vitreous Normal    Disc Normal    C/D Ratio 0.35    Macula Dull foveal reflex, no active CME Normal   Vessels Normal    Periphery Normal             IMAGING AND PROCEDURES  Imaging and Procedures for 08/19/20  OCT, Retina - OU - Both Eyes       Right Eye Quality was good. Scan locations included subfoveal. Central Foveal Thickness: 322. Progression has improved. Findings include vitreomacular adhesion .   Left Eye Quality was good. Scan locations included subfoveal. Central Foveal Thickness: 309. Progression has been stable. Findings include vitreomacular adhesion .   Notes OD vastly improved CME off topical NSAID, no physiologic impact from physiologic VMA  No residual CME               ASSESSMENT/PLAN:  Cystoid macular edema of right eye No recurrence of CME off topical NSAID for 1 month will observe  Follow-up with Dr. Channing Mutters  Whitaker as scheduled  Vitreomacular adhesion of right eye Physiologic with no pathological impact OD  Posterior synechiae (iris), right eye Stable, no CME induced at this time      ICD-10-CM   1. Cystoid macular edema of right eye  H35.351 OCT, Retina - OU - Both Eyes    2. Vitreomacular adhesion of right eye  H43.821     3. Posterior synechiae (iris), right eye  H21.541       1.  CME condition resolved OD, follow-up with Dr. Chalmers Guest as scheduled or within 6 months  2.  Follow-up here as needed remain off of ketorolac topical NSAID OD for the time being  3.  Patient instructed not to compress mash or rub the eyes  Ophthalmic Meds Ordered this visit:  No orders of the defined types were placed in this encounter.      Return if symptoms worsen or fail to improve, for Follow-up Dr. Chalmers Guest as scheduled.  There are no Patient Instructions on file for this visit.   Explained the diagnoses, plan, and follow up with the patient and they expressed understanding.  Patient expressed understanding of the importance of proper follow up care.   Alford Highland Deseri Loss M.D. Diseases & Surgery of the Retina and Vitreous [facility] 08/19/20     Abbreviations: M myopia (nearsighted); A astigmatism; H hyperopia (farsighted); P presbyopia; Mrx spectacle prescription;  CTL contact lenses; OD right eye; OS left eye; OU both eyes  XT exotropia; ET esotropia; PEK punctate epithelial keratitis; PEE punctate epithelial erosions; DES dry eye syndrome; MGD meibomian gland dysfunction; ATs artificial tears; PFAT's preservative free artificial tears; NSC nuclear sclerotic cataract; PSC posterior subcapsular cataract; ERM epi-retinal membrane; PVD posterior vitreous detachment; RD retinal detachment; DM diabetes mellitus; DR diabetic retinopathy; NPDR non-proliferative diabetic retinopathy; PDR proliferative diabetic retinopathy; CSME clinically significant macular edema; DME diabetic macular edema; dbh dot blot hemorrhages; CWS cotton wool spot; POAG primary open angle glaucoma; C/D cup-to-disc ratio; HVF humphrey visual field; GVF goldmann visual field; OCT optical  coherence tomography; IOP intraocular pressure; BRVO Branch retinal vein occlusion; CRVO central retinal vein occlusion; CRAO central retinal artery occlusion; BRAO branch retinal artery occlusion; RT retinal tear; SB scleral buckle; PPV pars plana vitrectomy; VH Vitreous hemorrhage; PRP panretinal laser photocoagulation; IVK intravitreal kenalog; VMT vitreomacular traction; MH Macular hole;  NVD neovascularization of the disc; NVE neovascularization elsewhere; AREDS age related eye disease study; ARMD age related macular degeneration; POAG primary open angle glaucoma; EBMD epithelial/anterior basement membrane dystrophy; ACIOL anterior chamber intraocular lens; IOL intraocular lens; PCIOL posterior chamber intraocular lens; Phaco/IOL phacoemulsification with intraocular lens placement; PRK photorefractive keratectomy; LASIK laser assisted in situ keratomileusis; HTN hypertension; DM diabetes mellitus; COPD chronic obstructive pulmonary disease

## 2020-08-19 NOTE — Assessment & Plan Note (Signed)
Physiologic with no pathological impact OD

## 2020-08-19 NOTE — Progress Notes (Signed)
Carelink Summary Report / Loop Recorder 

## 2020-08-24 ENCOUNTER — Encounter: Payer: Self-pay | Admitting: Family Medicine

## 2020-08-24 ENCOUNTER — Ambulatory Visit: Payer: Medicare Other | Admitting: Family Medicine

## 2020-08-24 NOTE — Progress Notes (Deleted)
No chief complaint on file.    HISTORY OF PRESENT ILLNESS: 08/24/20 ALL:  Renee Rodriguez is a 70 y.o. female here today for stroke follow up. She continues Zetia, atorvastatin and Plavix. Blood pressure on losartan and HCTZ. LDL was 133 at last visit with PCP 07/2019.    HISTORY (copied from Renee Rodriguez's previous note)  Today, 03/11/2020, [name] returns for 67-month stroke follow-up.  Stable from stroke standpoint since prior visit without new stroke/TIA symptoms and denies residual stroke deficits.  She has since returned back to work without difficulty.  Remains on Plavix without bleeding or bruising.  Remains on atorvastatin 80 mg daily and Zetia 10 mg daily without side effects.  Blood pressure today initially elevated and on recheck 144/88 on hydrochlorothiazide 25 mg daily and losartan 50 mg daily.  Lisinopril discontinued by PCP due to having dry cough which resolved after stopping.  Recently obtained wrist cuff monitor to start routinely monitoring BP at home.  Loop recorder has not shown atrial fibrillation thus far. Anxiety has been stable on sertaline 25mg  AM and 50mg  PM per PCP. No further concerns at this time.   REVIEW OF SYSTEMS: Out of a complete 14 system review of symptoms, the patient complains only of the following symptoms, and all other reviewed systems are negative.   ALLERGIES: Allergies  Allergen Reactions   Shellfish Allergy Anaphylaxis     HOME MEDICATIONS: Outpatient Medications Prior to Visit  Medication Sig Dispense Refill   atorvastatin (LIPITOR) 80 MG tablet Take 1 tablet (80 mg total) by mouth daily. 30 tablet 2   clopidogrel (PLAVIX) 75 MG tablet Take 1 tablet (75 mg total) by mouth daily. 90 tablet 3   ezetimibe (ZETIA) 10 MG tablet Take 1 tablet (10 mg total) by mouth daily. 90 tablet 3   hydrochlorothiazide (HYDRODIURIL) 25 MG tablet Take 1 tablet (25 mg total) by mouth daily. 30 tablet 2   losartan (COZAAR) 50 MG tablet Take 50 mg by  mouth daily.     sertraline (ZOLOFT) 25 MG tablet Take 25 mg by mouth in the morning and at bedtime. 25mg  AM and 50mg  PM     No facility-administered medications prior to visit.     PAST MEDICAL HISTORY: Past Medical History:  Diagnosis Date   Anemia    Arthritis    GERD (gastroesophageal reflux disease)    Hypertension      PAST SURGICAL HISTORY: Past Surgical History:  Procedure Laterality Date   BUBBLE STUDY  03/07/2019   Procedure: BUBBLE STUDY;  Surgeon: , MD;  Location: [facility] ENDOSCOPY;  Service: Cardiovascular;;   CESAREAN SECTION  08/1982   LOOP RECORDER INSERTION N/A 03/07/2019   Procedure: LOOP RECORDER INSERTION;  Surgeon: 05/05/2019, MD;  Location: [facility] INVASIVE CV LAB;  Service: Cardiovascular;  Laterality: N/A;   TEE WITHOUT CARDIOVERSION N/A 03/07/2019   Procedure: TRANSESOPHAGEAL ECHOCARDIOGRAM (TEE);  Surgeon: 09/1982, MD;  Location: [facility] ENDOSCOPY;  Service: Cardiovascular;  Laterality: N/A;   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   Dr 05/05/2019   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   Procedure: TOTAL KNEE ARTHROPLASTY;  Surgeon: [facility], MD;  Location: [facility] OR;  Service: Orthopedics;  Laterality: Left;   TUBAL LIGATION  08/1982,01/1985     FAMILY HISTORY: Family History  Problem Relation Age of Onset   Stroke Mother        Cerebral hemorrhage   Asthma Mother    Alcohol abuse Mother  Kidney disease Mother    Hypertension Mother    Cirrhosis Mother    Hypertension Sister    Colon cancer Neg Hx      SOCIAL HISTORY: Social History   Socioeconomic History   Marital status: Divorced    Spouse name: Not on file   Number of children: Not on file   Years of education: Not on file   Highest education level: Not on file  Occupational History   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  Tobacco Use   Smoking status: Former    Pack years: 0.00    Types: Cigarettes    Quit date: 03/04/2019    Years since quitting: 1.4   Smokeless tobacco:  Never  Substance and Sexual Activity   Alcohol use: Yes    Alcohol/week: 3.0 standard drinks    Types: 3 Standard drinks or equivalent per week    Comment: occasionally beer   Drug use: No   Sexual activity: Not on file  Other Topics Concern   Not on file  Social History Narrative   Not on file   Social Determinants of Health   Financial Resource Strain: Not on file  Food Insecurity: Not on file  Transportation Needs: Not on file  Physical Activity: Not on file  Stress: Not on file  Social Connections: Not on file  Intimate Partner Violence: Not on file     PHYSICAL EXAM  There were no vitals filed for this visit. There is no height or weight on file to calculate BMI.   Generalized: Well developed, in no acute distress  Cardiology: normal rate and rhythm, no murmur auscultated  Respiratory: clear to auscultation bilaterally    Neurological examination  Mentation: Alert oriented to time, place, history taking. Follows all commands speech and language fluent Cranial nerve II-XII: Pupils were equal round reactive to light. Extraocular movements were full, visual field were full on confrontational test. Facial sensation and strength were normal. Uvula tongue midline. Head turning and shoulder shrug  were normal and symmetric. Motor: The motor testing reveals 5 over 5 strength of all 4 extremities. Good symmetric motor tone is noted throughout.  Sensory: Sensory testing is intact to soft touch on all 4 extremities. No evidence of extinction is noted.  Coordination: Cerebellar testing reveals good finger-nose-finger and heel-to-shin bilaterally.  Gait and station: Gait is normal. Tandem gait is normal. Romberg is negative. No drift is seen.  Reflexes: Deep tendon reflexes are symmetric and normal bilaterally.    DIAGNOSTIC DATA (LABS, IMAGING, TESTING) - I reviewed patient records, labs, notes, testing and imaging myself where available.  Lab Results  Component Value Date    WBC 7.2 03/05/2019   HGB 14.7 03/05/2019   HCT 45.0 03/05/2019   MCV 91.6 03/05/2019   PLT 385 03/05/2019      Component Value Date/Time   NA 139 03/05/2019 0824   NA 140 09/25/2017 1649   K 3.8 03/05/2019 0824   CL 105 03/05/2019 0824   CO2 20 (L) 03/05/2019 0814   GLUCOSE 105 (H) 03/05/2019 0824   BUN 11 03/05/2019 0824   BUN 13 09/25/2017 1649   CREATININE 0.60 03/05/2019 0824   CREATININE 0.49 (L) 12/31/2014 1504   CALCIUM 9.3 03/05/2019 0814   PROT 7.4 03/05/2019 0814   PROT 7.4 09/25/2017 1649   ALBUMIN 3.8 03/05/2019 0814   ALBUMIN 4.4 09/25/2017 1649   AST 43 (H) 03/05/2019 0814   ALT 28 03/05/2019 0814   ALKPHOS 99 03/05/2019 0814   BILITOT  1.1 03/05/2019 0814   BILITOT 0.7 09/25/2017 1649   GFRNONAA >60 03/05/2019 0814   GFRAA >60 03/05/2019 0814   Lab Results  Component Value Date   CHOL 218 (H) 08/27/2019   HDL 55 08/27/2019   LDLCALC 133 (H) 08/27/2019   LDLDIRECT 173 (H) 10/19/2011   TRIG 170 (H) 08/27/2019   CHOLHDL 4.0 08/27/2019   Lab Results  Component Value Date   HGBA1C 5.5 08/27/2019   Lab Results  Component Value Date   VITAMINB12 607 08/27/2019   Lab Results  Component Value Date   TSH 0.309 (L) 08/27/2019    No flowsheet data found.   No flowsheet data found.   ASSESSMENT AND PLAN  70 y.o. year old female  has a past medical history of Anemia, Arthritis, GERD (gastroesophageal reflux disease), and Hypertension. here with    Recurrent strokes (HCC) - L BG and bilat CR  Hyperlipidemia LDL goal <70  Essential hypertension  Generalized anxiety disorder   No orders of the defined types were placed in this encounter.    No orders of the defined types were placed in this encounter.     Shawnie Dapper, MSN, FNP-C 08/24/2020, 8:24 AM  [facility] [address], [address] [place], [place] [address] [phone]

## 2020-08-28 ENCOUNTER — Ambulatory Visit (INDEPENDENT_AMBULATORY_CARE_PROVIDER_SITE_OTHER): Payer: Medicare Other

## 2020-08-28 DIAGNOSIS — I639 Cerebral infarction, unspecified: Secondary | ICD-10-CM

## 2020-08-31 LAB — CUP PACEART REMOTE DEVICE CHECK
Date Time Interrogation Session: 20220629230647
Implantable Pulse Generator Implant Date: 20210107

## 2020-09-08 ENCOUNTER — Ambulatory Visit: Payer: Medicare Other | Admitting: Adult Health

## 2020-09-16 NOTE — Progress Notes (Signed)
Carelink Summary Report / Loop Recorder 

## 2020-09-23 ENCOUNTER — Other Ambulatory Visit: Payer: Self-pay | Admitting: Adult Health

## 2020-09-29 LAB — CUP PACEART REMOTE DEVICE CHECK
Date Time Interrogation Session: 20220801230341
Implantable Pulse Generator Implant Date: 20210107

## 2020-09-30 ENCOUNTER — Ambulatory Visit (INDEPENDENT_AMBULATORY_CARE_PROVIDER_SITE_OTHER): Payer: Medicare Other

## 2020-09-30 DIAGNOSIS — I639 Cerebral infarction, unspecified: Secondary | ICD-10-CM | POA: Diagnosis not present

## 2020-10-08 ENCOUNTER — Ambulatory Visit: Payer: Medicare Other | Admitting: Adult Health

## 2020-10-27 NOTE — Progress Notes (Signed)
Carelink Summary Report / Loop Recorder 

## 2020-11-03 ENCOUNTER — Ambulatory Visit (INDEPENDENT_AMBULATORY_CARE_PROVIDER_SITE_OTHER): Payer: Medicare Other

## 2020-11-03 DIAGNOSIS — I639 Cerebral infarction, unspecified: Secondary | ICD-10-CM | POA: Diagnosis not present

## 2020-11-03 LAB — CUP PACEART REMOTE DEVICE CHECK
Date Time Interrogation Session: 20220903230640
Implantable Pulse Generator Implant Date: 20210107

## 2020-11-11 NOTE — Progress Notes (Signed)
Carelink Summary Report / Loop Recorder 

## 2020-11-23 ENCOUNTER — Other Ambulatory Visit: Payer: Self-pay | Admitting: Family Medicine

## 2020-11-23 DIAGNOSIS — E2839 Other primary ovarian failure: Secondary | ICD-10-CM

## 2020-12-07 ENCOUNTER — Ambulatory Visit (INDEPENDENT_AMBULATORY_CARE_PROVIDER_SITE_OTHER): Payer: Medicare Other

## 2020-12-07 DIAGNOSIS — I639 Cerebral infarction, unspecified: Secondary | ICD-10-CM

## 2020-12-09 LAB — CUP PACEART REMOTE DEVICE CHECK
Date Time Interrogation Session: 20221007000640
Implantable Pulse Generator Implant Date: 20210107

## 2020-12-15 NOTE — Progress Notes (Signed)
Carelink Summary Report / Loop Recorder 

## 2020-12-16 ENCOUNTER — Ambulatory Visit: Payer: Medicare Other | Admitting: Adult Health

## 2020-12-16 ENCOUNTER — Encounter: Payer: Self-pay | Admitting: Adult Health

## 2020-12-16 VITALS — BP 126/81 | HR 69 | Ht 60.0 in | Wt 134.0 lb

## 2020-12-16 DIAGNOSIS — I639 Cerebral infarction, unspecified: Secondary | ICD-10-CM | POA: Diagnosis not present

## 2020-12-16 DIAGNOSIS — I10 Essential (primary) hypertension: Secondary | ICD-10-CM | POA: Diagnosis not present

## 2020-12-16 DIAGNOSIS — F411 Generalized anxiety disorder: Secondary | ICD-10-CM

## 2020-12-16 DIAGNOSIS — E785 Hyperlipidemia, unspecified: Secondary | ICD-10-CM | POA: Diagnosis not present

## 2020-12-16 NOTE — Patient Instructions (Signed)
Continue clopidogrel 75 mg daily  and atorvastatin 80 mg daily and Zetia for secondary stroke prevention  Loop recorder will continue to be monitored for possible atrial fibrillation  Continue to follow up with PCP regarding cholesterol and blood pressure management  Maintain strict control of hypertension with blood pressure goal below 130/90 and cholesterol with LDL cholesterol (bad cholesterol) goal below 70 mg/dL.         Thank you for coming to see [place] at [facility]. I hope we have been able to provide you high quality care today.  You may receive a patient satisfaction survey over the next few weeks. We would appreciate your feedback and comments so that we may continue to improve ourselves and the health of our patients.

## 2020-12-16 NOTE — Progress Notes (Signed)
[name] [address] [place]. [place] [address] [phone]       STROKE FOLLOW UP NOTE  [name] Date of Birth:  December 03, 1950 Medical Record Number:  [id number]   Reason for Referral: stroke follow up    CHIEF COMPLAINT:  Chief Complaint  Patient presents with   Follow-up    Rm 3 alone  Pt is well and stable, no new concerns     HPI:  Update 12/16/2020 JM: Returns for overdue stroke follow-up.  Overall stable from stroke standpoint.  Denies new or reoccurring stroke/TIA symptoms.  Compliant on Plavix and Zetia and recently switched from atorvastatin to Crestor without side effects.  Has follow-up next month with PCP for repeat lipid panel which was not well controlled on atorvastatin per pt (unable to view labs via epic).  Blood pressure today 126/81.  Loop recorder has not shown atrial fibrillation thus far.  No new concerns at this time.     History provided for reference purposes only Update 03/11/2020 JM: [name] returns for 52-month stroke follow-up.  Stable from stroke standpoint since prior visit without new stroke/TIA symptoms and denies residual stroke deficits.  She has since returned back to work without difficulty.  Remains on Plavix without bleeding or bruising.  Remains on atorvastatin 80 mg daily and Zetia 10 mg daily without side effects.  Blood pressure today initially elevated and on recheck 144/88 on hydrochlorothiazide 25 mg daily and losartan 50 mg daily.  Lisinopril discontinued by PCP due to having dry cough which resolved after stopping.  Recently obtained wrist cuff monitor to start routinely monitoring BP at home.  Loop recorder has not shown atrial fibrillation thus far. Anxiety has been stable on sertaline 25mg  AM and 50mg  PM per PCP. No further concerns at this time.  Update 10/28/2019 JM: [name] returns for follow-up visit Prior complaints of imbalance, worsening anxiety and memory loss with MRI showing subacute  right corona radiata infarct that was not present on imaging in 03/2019 Imbalance has been greatly improving since prior visit She does report occasional dizziness type sensation especially after sitting for prolonged period of time or when getting out of bed in the morning Memory loss and anxiety have been greatly improving.  Occasional difficulty with anxiety. Increased sertraline to 50 mg nightly after prior visit with improvement and denies side effects. Denies new or worsening stroke/TIA symptoms Remains on clopidogrel without bleeding or bruising.  Previously on aspirin but changed to clopidogrel due to recurrent stroke despite aspirin compliance Continues on atorvastatin 80 mg daily and Zetia 10 mg daily without side effects.  Zetia recently added due to continued elevated LDL at 131 which is above goal of 70 Blood pressure today 164/90, asymptomatic.  Does not routinely monitor at home Loop recorder has not shown atrial fibrillation thus far No further concerns at this time  Update 08/27/2019 JM: [name] returns for follow-up regarding cryptogenic left BG/CR stroke in 03/2019.  She reports over the past 3 weeks she started to experience imbalance where she feels unstable with ambulation and difficulty with stepping down from a curb.  Denies vertigo, dizziness or lightheadedness.  She did not experience the symptoms prior and reports completely recovered from prior stroke.  Denies weakness, numbness/tingling, speech difficulty or visual changes.  She also reports increased anxiety and worsening memory over the past few weeks.  She does report compliance with all prescribed medications. Continues on aspirin and atorvastatin 80 mg daily for secondary stroke  prevention.  No recent lab work.  Blood pressure today 182/92, asymptomatic.  She has not taken antihypertensives yet this morning.  She does not routinely monitor at home.  Loop recorder is not shown atrial fibrillation thus far.  She was  evaluated by Dr. Vickey Huger for possible sleep apnea undergoing split-night study on 05/02/2019 which did not show any evidence of sleep apnea.  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  She was started on sertraline 25 mg daily due to anxiety related concerns by Dr. Vickey Huger.  No further concerns at this time.  Initial visit 04/09/2019 JM: [name] is a 70 year old female who is being seen today for hospital follow-up.  She has been recovering well from a stroke standpoint with only mild left hand decreased fine motor control and occasional word finding difficulty.  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  She is questioning if she can return at this time.  She has completed 3 weeks DAPT and continues on aspirin alone without bleeding or bruising.  Continues on atorvastatin 80 mg daily and does complain of mild left calf pain and left side pain. She states initially feeling on right side cramping but now left. Cramping does not limit daily activity or functioning. She does have follow up with PCP in the near future and per patient, will have lab work obtained including lipid panel. Blood pressure today satisfactory at 122/80. Loop recorder has not shown atrial fibrillation thus far. Discussion regarding suspected sleep apnea during admission. Does admit to morning headaches, day time fatigue and insomnia. She has not previously underwent sleep study. She denies complete tobacco cessation. She continues to use THC occasionally but has been trying to decrease. Typically uses to help insomnia and anxiety. Loop recorder has not shown atrial fibrillation thus far. Denies new or worsening stroke/TIA symptoms.   Stroke admission 03/05/2019: [name] is a 70 y.o. female with history of hypertension, GERD, arthritis and anemia who presented on 03/05/2019 with R sided  weakness and inability to get her words out.  Evaluated by stroke team and Dr. Pearlean Brownie with stroke work-up revealing left basal ganglia and corona radiata infarcts as evidenced on MRI embolic pattern secondary to unknown source. NIHSS score 6 and CT head no evidence of hemorrhage therefore received tPA 03/05/2019 at 0840.  In addition to acute infarcts, MRI also showed small vessel disease, old right thalamic lacune, microhemorrhage and right VA cervical artery occlusion.  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  Repeat CT head 24 hours post TPA no evidence of hemorrhage with evolving left basal ganglia and corona radiata infarct.  2D echo unremarkable.  TEE negative for cardiac source of embolus.  Loop recorder placed to evaluate for atrial fibrillation as etiology of recent stroke.  Recommended DAPT for 3 weeks and aspirin alone as she is not previously on antithrombotic.  History of HTN elevated upon presentation and treated with Cleviprex; noncompliance of prior home dose antihypertensive and recommended long-term pedal normotensive range.  LDL 200 previously prescribed atorvastatin 20 mg daily but reported noncompliance and recommended initiating atorvastatin 80 mg daily.  No evidence or history of DM with A1c 5.4.  UDS positive for THC.  Other stroke risk factors include advanced age, tobacco use, EtOH use, THC use,  family history of stroke and possible OSA.  Discharged home in stable condition recommendation of outpatient therapy.  Stroke:   L basal ganglia and corona radiata infarct embolic secondary to unknown source Code Stroke CT head No acute abnormality. Small vessel disease. Old R thalamic infarct. ASPECTS 10.    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 Mild ICA bifurcation atherosclerosis. Mild small vessel disease. Degenerative CS. MRI  Small posterior lentiform  nucleus and corona radiata infarct > 2cm in size. Small vessel disease. Old R thalamic lacune and micro hemorrhage. R VA cervical artery occlusion.  Repeat CT head 24h no hemorrhage. evolving L basal ganglia and corona radiata infarct  2D Echo EF 60-65%. No source of embolus. RA dilated.  TEE neg for embolic source.  implantable loop recorder placed 1/7 to evaluate for atrial fibrillation as etiology of stroke ([place]) We will not perform a TCD bubble given age > 65, LDL 200 HgbA1c 5.4 UDS positive THC  No antithrombotic prior to admission, started ib aspirin 81 mg and plavix 75 mg daily x 3 weeks, then aspirin alone.  Therapy recommendations:  OP OT, no PT, no SLP Disposition:  return home      ROS:   14 system review of systems performed and negative with exception of no complaints PMH:  Past Medical History:  Diagnosis Date   Anemia    Arthritis    GERD (gastroesophageal reflux disease)    Hypertension     PSH:  Past Surgical History:  Procedure Laterality Date   BUBBLE STUDY  03/07/2019   Procedure: BUBBLE STUDY;  Surgeon: Jake Bathe, MD;  Location: [facility] ENDOSCOPY;  Service: Cardiovascular;;   CATARACT EXTRACTION Right 10/2019   CESAREAN SECTION  08/29/1982   LOOP RECORDER INSERTION N/A 03/07/2019   Procedure: LOOP RECORDER INSERTION;  Surgeon: Hillis Range, MD;  Location: [facility] INVASIVE CV LAB;  Service: Cardiovascular;  Laterality: N/A;   TEE WITHOUT CARDIOVERSION N/A 03/07/2019   Procedure: TRANSESOPHAGEAL ECHOCARDIOGRAM (TEE);  Surgeon: Jake Bathe, MD;  Location: [facility] ENDOSCOPY;  Service: Cardiovascular;  Laterality: N/A;   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   Dr Sherlean Foot   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   Procedure: TOTAL KNEE ARTHROPLASTY;  Surgeon: Dannielle Huh, MD;  Location: [facility] OR;  Service: Orthopedics;  Laterality: Left;   TUBAL LIGATION  08/1982,01/1985    Social History:  Social History   Socioeconomic History   Marital status: Divorced     Spouse name: Not on file   Number of children: Not on file   Years of education: Not on file   Highest education level: Not on file  Occupational History   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  Tobacco Use   Smoking status: Former    Types: Cigarettes    Quit date: 03/04/2019    Years since quitting: 1.7   Smokeless tobacco: Never  Substance and Sexual Activity   Alcohol use: Yes    Alcohol/week: 3.0 standard drinks    Types: 3 Standard drinks or equivalent per week    Comment: occasionally beer   Drug use: No   Sexual activity: Not on file  Other Topics Concern   Not on file  Social History Narrative   Not on file   Social Determinants of Health   Financial Resource Strain: Not on file  Food Insecurity: Not on file  Transportation Needs: Not on file  Physical Activity: Not on file  Stress: Not on file  Social Connections: Not  on file  Intimate Partner Violence: Not on file    Family History:  Family History  Problem Relation Age of Onset   Stroke Mother        Cerebral hemorrhage   Asthma Mother    Alcohol abuse Mother    Kidney disease Mother    Hypertension Mother    Cirrhosis Mother    Hypertension Sister    Colon cancer Neg Hx     Medications:   Current Outpatient Medications on File Prior to Visit  Medication Sig Dispense Refill   amLODipine (NORVASC) 5 MG tablet Take 5 mg by mouth daily.     clopidogrel (PLAVIX) 75 MG tablet TAKE 1 TABLET BY MOUTH EVERY DAY 90 tablet 3   ezetimibe (ZETIA) 10 MG tablet TAKE 1 TABLET BY MOUTH EVERY DAY 90 tablet 3   hydrochlorothiazide (HYDRODIURIL) 25 MG tablet Take 1 tablet (25 mg total) by mouth daily. 30 tablet 2   irbesartan (AVAPRO) 300 MG tablet Take 300 mg by mouth daily.     losartan (COZAAR) 50 MG tablet Take 50 mg by mouth daily.     pantoprazole (PROTONIX) 20 MG tablet Take 20 mg by mouth daily.     rosuvastatin (CRESTOR) 40 MG tablet Take 40 mg by mouth at bedtime.     sertraline (ZOLOFT) 25 MG tablet Take 25  mg by mouth in the morning and at bedtime. 25mg  AM and 50mg  PM     sertraline (ZOLOFT) 50 MG tablet Take 50 mg by mouth daily.     No current facility-administered medications on file prior to visit.    Allergies:   Allergies  Allergen Reactions   Shellfish Allergy Anaphylaxis     Physical Exam  Vitals:   12/16/20 1339  BP: 126/81  Pulse: 69  Weight: 134 lb (60.8 kg)  Height: 5' (1.524 m)    Body mass index is 26.17 kg/m. No results found.  General: well developed, well nourished, very pleasant elderly AA female, seated, in no evident distress Head: head normocephalic and atraumatic.   Neck: supple with no carotid or supraclavicular bruits Cardiovascular: regular rate and rhythm, no murmurs Musculoskeletal: no deformity Skin:  no rash/petichiae Vascular:  Normal pulses all extremities   Neurologic Exam Mental Status: Awake and fully alert.   Fluent speech and language.  Oriented to place and time. Recent memory subjectively impaired and remote memory intact. Attention span, concentration and fund of knowledge appropriate during visit. Mood and affect appropriate.  Cranial Nerves: Pupils equal, briskly reactive to light. Extraocular movements full without nystagmus. Visual fields full to confrontation. Hearing intact. Facial sensation intact. Face, tongue, palate moves normally and symmetrically.  Motor: Normal bulk and tone. Normal strength in all tested extremity muscles  Sensory.:  Intact to light touch, vibratory and pinprick sensation in all tested extremities Coordination: Rapid alternating movements normal in all extremities. Finger-to-nose and heel-to-shin performed accurately bilaterally. Gait and Station: Arises from chair without difficulty. Stance is normal. Gait demonstrates normal stride length and balance without use of assistive device.  Difficulty performing tandem walk.  Reflexes: 1+ and symmetric. Toes downgoing.        ASSESSMENT: Renee Rodriguez is  a 71 y.o. year old female presented with right-sided weakness and expressive aphasia on 03/05/2019 with stroke work-up revealing left basal ganglia and corona radiata infarcts s/p TPA secondary to unknown source.  Loop recorder placed for long-term monitoring of possible atrial fibrillation and stroke etiology.  Found to have subacute right corona  radiata infarct on imaging in 09/2019 with complaints of imbalance, worsening anxiety and memory loss.  Vascular risk factors include HTN, HLD, tobacco use, THC use, EtOH use, and hx of medication noncompliance.  Underwent sleep study which was negative for sleep apnea.     PLAN:  R CR stroke L BG and CR stroke, cryptogenic Recovered well without residual deficit Continue Plavix and Crestor 40 mg mg daily and Zetia 10 mg daily for secondary stroke prevention Loop recorder has not shown atrial fibrillation thus far -monthly reports personally reviewed Discussed secondary stroke prevention and importance of close PCP follow-up for aggressive stroke risk factor management HTN:  BP goal <130/90.  Well-controlled on current regimen per PCP HLD:  LDL goal<70.  On Crestor 40 mg daily and Zetia 10 mg daily.  Uncontrolled levels on atorvastatin.  Repeat labs next month with PCP Anxiety: Overall stable on sertraline per PCP   Overall stable from stroke standpoint and recommend follow-up on an as-needed basis   CC:  [facility] provider: Dr. Bayard Hugger, Melanee Spry, MD    I spent 26 minutes of face-to-face and non-face-to-face time with patient.  This included previsit chart review, lab review, study review, electronic health record documentation, patient education regarding recurrent strokes with resolution of deficits, importance of managing stroke risk factors and secondary stroke prevention measures, review of loop recorder and discussion and answered all other questions to patient satisfaction   Ihor Austin, AGNP-BC  [facility] [address] [place], [place] [address]  Phone [phone] Fax [phone] Note: This document was prepared with digital dictation and possible smart phrase technology. Any transcriptional errors that result from this process are unintentional.

## 2020-12-23 ENCOUNTER — Ambulatory Visit: Payer: Medicare Other | Admitting: Adult Health

## 2021-01-07 LAB — CUP PACEART REMOTE DEVICE CHECK
Date Time Interrogation Session: 20221108230603
Implantable Pulse Generator Implant Date: 20210107

## 2021-01-11 ENCOUNTER — Ambulatory Visit (INDEPENDENT_AMBULATORY_CARE_PROVIDER_SITE_OTHER): Payer: Medicare Other

## 2021-01-11 DIAGNOSIS — I639 Cerebral infarction, unspecified: Secondary | ICD-10-CM

## 2021-01-18 NOTE — Progress Notes (Signed)
Carelink Summary Report / Loop Recorder 

## 2021-02-10 LAB — CUP PACEART REMOTE DEVICE CHECK
Date Time Interrogation Session: 20221211231032
Implantable Pulse Generator Implant Date: 20210107

## 2021-02-15 ENCOUNTER — Ambulatory Visit (INDEPENDENT_AMBULATORY_CARE_PROVIDER_SITE_OTHER): Payer: Medicare Other

## 2021-02-15 DIAGNOSIS — I639 Cerebral infarction, unspecified: Secondary | ICD-10-CM

## 2021-02-25 NOTE — Progress Notes (Signed)
Carelink Summary Report / Loop Recorder 

## 2021-03-22 ENCOUNTER — Ambulatory Visit (INDEPENDENT_AMBULATORY_CARE_PROVIDER_SITE_OTHER): Payer: Medicare Other

## 2021-03-22 DIAGNOSIS — I639 Cerebral infarction, unspecified: Secondary | ICD-10-CM

## 2021-03-22 LAB — CUP PACEART REMOTE DEVICE CHECK
Date Time Interrogation Session: 20230122230742
Implantable Pulse Generator Implant Date: 20210107

## 2021-04-01 NOTE — Progress Notes (Signed)
Carelink Summary Report / Loop Recorder 

## 2021-04-26 ENCOUNTER — Ambulatory Visit (INDEPENDENT_AMBULATORY_CARE_PROVIDER_SITE_OTHER): Payer: Medicare Other

## 2021-04-26 DIAGNOSIS — I639 Cerebral infarction, unspecified: Secondary | ICD-10-CM | POA: Diagnosis not present

## 2021-04-27 LAB — CUP PACEART REMOTE DEVICE CHECK
Date Time Interrogation Session: 20230224230845
Implantable Pulse Generator Implant Date: 20210107

## 2021-05-03 NOTE — Progress Notes (Signed)
Carelink Summary Report / Loop Recorder 

## 2021-05-27 ENCOUNTER — Ambulatory Visit (INDEPENDENT_AMBULATORY_CARE_PROVIDER_SITE_OTHER): Payer: Medicare Other

## 2021-05-27 DIAGNOSIS — I639 Cerebral infarction, unspecified: Secondary | ICD-10-CM

## 2021-05-27 LAB — CUP PACEART REMOTE DEVICE CHECK
Date Time Interrogation Session: 20230329230245
Implantable Pulse Generator Implant Date: 20210107

## 2021-06-08 NOTE — Progress Notes (Signed)
Carelink Summary Report / Loop Recorder 

## 2021-06-29 ENCOUNTER — Ambulatory Visit (INDEPENDENT_AMBULATORY_CARE_PROVIDER_SITE_OTHER): Payer: Medicare Other

## 2021-06-29 DIAGNOSIS — I639 Cerebral infarction, unspecified: Secondary | ICD-10-CM | POA: Diagnosis not present

## 2021-06-29 LAB — CUP PACEART REMOTE DEVICE CHECK
Date Time Interrogation Session: 20230501230956
Implantable Pulse Generator Implant Date: 20210107

## 2021-07-13 NOTE — Progress Notes (Signed)
Carelink Summary Report / Loop Recorder 

## 2021-07-21 ENCOUNTER — Other Ambulatory Visit: Payer: Self-pay | Admitting: Family Medicine

## 2021-07-21 ENCOUNTER — Other Ambulatory Visit (HOSPITAL_COMMUNITY): Payer: Self-pay | Admitting: Family Medicine

## 2021-07-21 DIAGNOSIS — E0590 Thyrotoxicosis, unspecified without thyrotoxic crisis or storm: Secondary | ICD-10-CM

## 2021-08-02 ENCOUNTER — Ambulatory Visit (INDEPENDENT_AMBULATORY_CARE_PROVIDER_SITE_OTHER): Payer: Medicare Other

## 2021-08-02 DIAGNOSIS — I639 Cerebral infarction, unspecified: Secondary | ICD-10-CM

## 2021-08-03 LAB — CUP PACEART REMOTE DEVICE CHECK
Date Time Interrogation Session: 20230603231115
Implantable Pulse Generator Implant Date: 20210107

## 2021-08-17 NOTE — Progress Notes (Signed)
Carelink Summary Report / Loop Recorder 

## 2021-09-04 LAB — CUP PACEART REMOTE DEVICE CHECK
Date Time Interrogation Session: 20230706231427
Implantable Pulse Generator Implant Date: 20210107

## 2021-09-06 ENCOUNTER — Ambulatory Visit (INDEPENDENT_AMBULATORY_CARE_PROVIDER_SITE_OTHER): Payer: Medicare Other

## 2021-09-06 DIAGNOSIS — I639 Cerebral infarction, unspecified: Secondary | ICD-10-CM | POA: Diagnosis not present

## 2021-09-27 NOTE — Progress Notes (Signed)
Carelink Summary Report / Loop Recorder 

## 2021-10-01 ENCOUNTER — Other Ambulatory Visit (HOSPITAL_COMMUNITY): Payer: Self-pay | Admitting: Family Medicine

## 2021-10-01 ENCOUNTER — Ambulatory Visit (HOSPITAL_COMMUNITY)
Admission: RE | Admit: 2021-10-01 | Discharge: 2021-10-01 | Disposition: A | Discharge status: Home / Self Care | Payer: Medicare Other | Source: Ambulatory Visit | Attending: Family Medicine | Admitting: Family Medicine

## 2021-10-01 DIAGNOSIS — M79602 Pain in left arm: Secondary | ICD-10-CM

## 2021-10-01 DIAGNOSIS — M79601 Pain in right arm: Secondary | ICD-10-CM | POA: Insufficient documentation

## 2021-10-01 DIAGNOSIS — R0989 Other specified symptoms and signs involving the circulatory and respiratory systems: Secondary | ICD-10-CM | POA: Diagnosis present

## 2021-10-07 LAB — CUP PACEART REMOTE DEVICE CHECK
Date Time Interrogation Session: 20230808231209
Implantable Pulse Generator Implant Date: 20210107

## 2021-10-11 ENCOUNTER — Ambulatory Visit: Payer: Medicare Other

## 2021-11-13 LAB — CUP PACEART REMOTE DEVICE CHECK
Date Time Interrogation Session: 20230910231038
Implantable Pulse Generator Implant Date: 20210107

## 2021-11-15 ENCOUNTER — Ambulatory Visit (INDEPENDENT_AMBULATORY_CARE_PROVIDER_SITE_OTHER): Payer: Medicare Other

## 2021-11-15 DIAGNOSIS — I639 Cerebral infarction, unspecified: Secondary | ICD-10-CM | POA: Diagnosis not present

## 2021-11-30 NOTE — Progress Notes (Signed)
Carelink Summary Report / Loop Recorder 

## 2021-12-15 LAB — CUP PACEART REMOTE DEVICE CHECK
Date Time Interrogation Session: 20231013231034
Implantable Pulse Generator Implant Date: 20210107

## 2021-12-20 ENCOUNTER — Ambulatory Visit (INDEPENDENT_AMBULATORY_CARE_PROVIDER_SITE_OTHER): Payer: Medicare Other

## 2021-12-20 DIAGNOSIS — I639 Cerebral infarction, unspecified: Secondary | ICD-10-CM

## 2021-12-28 ENCOUNTER — Other Ambulatory Visit: Payer: Self-pay

## 2022-01-19 NOTE — Progress Notes (Signed)
Carelink Summary Report / Loop Recorder 

## 2022-01-24 ENCOUNTER — Ambulatory Visit (INDEPENDENT_AMBULATORY_CARE_PROVIDER_SITE_OTHER): Payer: Medicare Other

## 2022-01-24 DIAGNOSIS — I639 Cerebral infarction, unspecified: Secondary | ICD-10-CM

## 2022-01-25 LAB — CUP PACEART REMOTE DEVICE CHECK
Date Time Interrogation Session: 20231126231529
Implantable Pulse Generator Implant Date: 20210107

## 2022-03-01 ENCOUNTER — Ambulatory Visit (INDEPENDENT_AMBULATORY_CARE_PROVIDER_SITE_OTHER): Payer: Medicare HMO

## 2022-03-01 DIAGNOSIS — I639 Cerebral infarction, unspecified: Secondary | ICD-10-CM | POA: Diagnosis not present

## 2022-03-01 LAB — CUP PACEART REMOTE DEVICE CHECK
Date Time Interrogation Session: 20240101230925
Implantable Pulse Generator Implant Date: 20210107

## 2022-03-08 NOTE — Progress Notes (Signed)
Carelink Summary Report / Loop Recorder 

## 2022-04-03 LAB — CUP PACEART REMOTE DEVICE CHECK
Date Time Interrogation Session: 20240203231136
Implantable Pulse Generator Implant Date: 20210107

## 2022-04-04 ENCOUNTER — Ambulatory Visit: Payer: Medicare HMO

## 2022-04-04 DIAGNOSIS — I639 Cerebral infarction, unspecified: Secondary | ICD-10-CM

## 2022-04-07 NOTE — Progress Notes (Signed)
Carelink Summary Report / Loop Recorder 

## 2022-05-09 ENCOUNTER — Ambulatory Visit (INDEPENDENT_AMBULATORY_CARE_PROVIDER_SITE_OTHER): Payer: Medicare HMO

## 2022-05-09 DIAGNOSIS — I639 Cerebral infarction, unspecified: Secondary | ICD-10-CM

## 2022-05-11 LAB — CUP PACEART REMOTE DEVICE CHECK
Date Time Interrogation Session: 20240310231036
Implantable Pulse Generator Implant Date: 20210107

## 2022-05-16 ENCOUNTER — Other Ambulatory Visit: Payer: Self-pay | Admitting: Family Medicine

## 2022-05-16 DIAGNOSIS — Z1231 Encounter for screening mammogram for malignant neoplasm of breast: Secondary | ICD-10-CM

## 2022-05-23 NOTE — Progress Notes (Signed)
Carelink Summary Report / Loop Recorder 

## 2022-06-13 ENCOUNTER — Ambulatory Visit (INDEPENDENT_AMBULATORY_CARE_PROVIDER_SITE_OTHER): Payer: Medicare HMO

## 2022-06-13 DIAGNOSIS — I639 Cerebral infarction, unspecified: Secondary | ICD-10-CM | POA: Diagnosis not present

## 2022-06-14 LAB — CUP PACEART REMOTE DEVICE CHECK
Date Time Interrogation Session: 20240414231135
Implantable Pulse Generator Implant Date: 20210107

## 2022-06-17 ENCOUNTER — Ambulatory Visit
Admission: RE | Admit: 2022-06-17 | Discharge: 2022-06-17 | Disposition: A | Discharge status: Home / Self Care | Payer: Medicare HMO | Source: Ambulatory Visit | Attending: Family Medicine | Admitting: Family Medicine

## 2022-06-17 DIAGNOSIS — Z1231 Encounter for screening mammogram for malignant neoplasm of breast: Secondary | ICD-10-CM

## 2022-06-21 NOTE — Progress Notes (Signed)
Carelink Summary Report / Loop Recorder 

## 2022-07-18 ENCOUNTER — Ambulatory Visit (INDEPENDENT_AMBULATORY_CARE_PROVIDER_SITE_OTHER): Payer: Medicare HMO

## 2022-07-18 DIAGNOSIS — I639 Cerebral infarction, unspecified: Secondary | ICD-10-CM

## 2022-07-18 LAB — CUP PACEART REMOTE DEVICE CHECK
Date Time Interrogation Session: 20240517230454
Implantable Pulse Generator Implant Date: 20210107

## 2022-07-21 NOTE — Progress Notes (Signed)
Carelink Summary Report / Loop Recorder 

## 2022-08-16 NOTE — Progress Notes (Signed)
Carelink Summary Report / Loop Recorder 

## 2022-08-22 ENCOUNTER — Ambulatory Visit: Payer: Medicare Other

## 2022-08-22 DIAGNOSIS — I639 Cerebral infarction, unspecified: Secondary | ICD-10-CM | POA: Diagnosis not present

## 2022-08-22 LAB — CUP PACEART REMOTE DEVICE CHECK
Date Time Interrogation Session: 20240623230931
Implantable Pulse Generator Implant Date: 20210107

## 2022-09-05 ENCOUNTER — Other Ambulatory Visit: Payer: Self-pay | Admitting: Family Medicine

## 2022-09-05 ENCOUNTER — Ambulatory Visit
Admission: RE | Admit: 2022-09-05 | Discharge: 2022-09-05 | Disposition: A | Discharge status: Home / Self Care | Payer: Medicare HMO | Source: Ambulatory Visit | Attending: Family Medicine | Admitting: Family Medicine

## 2022-09-05 DIAGNOSIS — M25562 Pain in left knee: Secondary | ICD-10-CM

## 2022-09-12 NOTE — Progress Notes (Signed)
 [facility]link Summary Report / Loop Recorder 

## 2022-09-26 ENCOUNTER — Ambulatory Visit (INDEPENDENT_AMBULATORY_CARE_PROVIDER_SITE_OTHER): Payer: Medicare Other

## 2022-09-26 DIAGNOSIS — I639 Cerebral infarction, unspecified: Secondary | ICD-10-CM

## 2022-10-13 NOTE — Progress Notes (Signed)
Carelink Summary Report / Loop Recorder 

## 2022-10-27 LAB — CUP PACEART REMOTE DEVICE CHECK
Date Time Interrogation Session: 20240828230827
Implantable Pulse Generator Implant Date: 20210107

## 2022-11-01 ENCOUNTER — Ambulatory Visit (INDEPENDENT_AMBULATORY_CARE_PROVIDER_SITE_OTHER): Payer: Medicare HMO

## 2022-11-01 DIAGNOSIS — I639 Cerebral infarction, unspecified: Secondary | ICD-10-CM

## 2022-11-14 NOTE — Progress Notes (Signed)
Carelink Summary Report / Loop Recorder 

## 2022-11-30 LAB — CUP PACEART REMOTE DEVICE CHECK
Date Time Interrogation Session: 20240930231740
Implantable Pulse Generator Implant Date: 20210107

## 2022-12-05 ENCOUNTER — Ambulatory Visit: Payer: Medicare HMO

## 2022-12-05 DIAGNOSIS — I639 Cerebral infarction, unspecified: Secondary | ICD-10-CM

## 2022-12-22 NOTE — Progress Notes (Signed)
Carelink Summary Report / Loop Recorder 

## 2023-01-04 LAB — CUP PACEART REMOTE DEVICE CHECK
Date Time Interrogation Session: 20241102230008
Implantable Pulse Generator Implant Date: 20210107

## 2023-01-09 ENCOUNTER — Ambulatory Visit (INDEPENDENT_AMBULATORY_CARE_PROVIDER_SITE_OTHER): Payer: Medicare HMO

## 2023-01-09 DIAGNOSIS — I639 Cerebral infarction, unspecified: Secondary | ICD-10-CM

## 2023-02-06 NOTE — Progress Notes (Signed)
Carelink Summary Report / Loop Recorder 

## 2023-02-13 ENCOUNTER — Ambulatory Visit: Payer: Medicare HMO

## 2023-02-13 DIAGNOSIS — I639 Cerebral infarction, unspecified: Secondary | ICD-10-CM | POA: Diagnosis not present

## 2023-02-13 LAB — CUP PACEART REMOTE DEVICE CHECK
Date Time Interrogation Session: 20241215230141
Implantable Pulse Generator Implant Date: 20210107

## 2023-03-20 ENCOUNTER — Ambulatory Visit (INDEPENDENT_AMBULATORY_CARE_PROVIDER_SITE_OTHER): Payer: Medicare HMO

## 2023-03-20 DIAGNOSIS — I639 Cerebral infarction, unspecified: Secondary | ICD-10-CM | POA: Diagnosis not present

## 2023-03-20 LAB — CUP PACEART REMOTE DEVICE CHECK
Date Time Interrogation Session: 20250119230259
Implantable Pulse Generator Implant Date: 20210107

## 2023-03-23 NOTE — Progress Notes (Signed)
Carelink Summary Report / Loop Recorder 

## 2023-03-25 ENCOUNTER — Encounter: Payer: Self-pay | Admitting: Cardiovascular Disease

## 2023-04-24 ENCOUNTER — Ambulatory Visit (INDEPENDENT_AMBULATORY_CARE_PROVIDER_SITE_OTHER): Payer: Medicare HMO

## 2023-04-24 DIAGNOSIS — I639 Cerebral infarction, unspecified: Secondary | ICD-10-CM

## 2023-04-26 LAB — CUP PACEART REMOTE DEVICE CHECK
Date Time Interrogation Session: 20250223230047
Implantable Pulse Generator Implant Date: 20210107

## 2023-04-28 NOTE — Progress Notes (Signed)
 Carelink Summary Report / Loop Recorder

## 2023-05-02 ENCOUNTER — Encounter: Payer: Self-pay | Admitting: Cardiovascular Disease

## 2023-05-29 ENCOUNTER — Ambulatory Visit (INDEPENDENT_AMBULATORY_CARE_PROVIDER_SITE_OTHER): Payer: Medicare HMO

## 2023-05-29 DIAGNOSIS — I639 Cerebral infarction, unspecified: Secondary | ICD-10-CM | POA: Diagnosis not present

## 2023-05-29 LAB — CUP PACEART REMOTE DEVICE CHECK
Date Time Interrogation Session: 20250330230143
Implantable Pulse Generator Implant Date: 20210107

## 2023-05-31 NOTE — Progress Notes (Signed)
 Carelink Summary Report / Loop Recorder

## 2023-05-31 NOTE — Addendum Note (Signed)
 Addended by: Geralyn Flash D on: 05/31/2023 02:06 PM   Modules accepted: Orders

## 2023-06-08 ENCOUNTER — Encounter: Payer: Self-pay | Admitting: Cardiovascular Disease

## 2023-07-03 ENCOUNTER — Ambulatory Visit (INDEPENDENT_AMBULATORY_CARE_PROVIDER_SITE_OTHER): Payer: Medicare HMO

## 2023-07-03 DIAGNOSIS — I639 Cerebral infarction, unspecified: Secondary | ICD-10-CM

## 2023-07-03 LAB — CUP PACEART REMOTE DEVICE CHECK
Date Time Interrogation Session: 20250504230640
Implantable Pulse Generator Implant Date: 20210107

## 2023-07-11 ENCOUNTER — Ambulatory Visit: Payer: Self-pay | Admitting: Cardiovascular Disease

## 2023-07-14 NOTE — Progress Notes (Signed)
 Carelink Summary Report / Loop Recorder

## 2023-08-03 ENCOUNTER — Ambulatory Visit (INDEPENDENT_AMBULATORY_CARE_PROVIDER_SITE_OTHER): Payer: Self-pay

## 2023-08-03 DIAGNOSIS — I639 Cerebral infarction, unspecified: Secondary | ICD-10-CM

## 2023-08-03 LAB — CUP PACEART REMOTE DEVICE CHECK
Date Time Interrogation Session: 20250604230218
Implantable Pulse Generator Implant Date: 20210107

## 2023-08-04 ENCOUNTER — Ambulatory Visit: Payer: Self-pay | Admitting: Cardiovascular Disease

## 2023-08-22 NOTE — Progress Notes (Signed)
 Carelink Summary Report / Loop Recorder

## 2023-09-04 ENCOUNTER — Ambulatory Visit (INDEPENDENT_AMBULATORY_CARE_PROVIDER_SITE_OTHER): Payer: Self-pay

## 2023-09-04 DIAGNOSIS — I639 Cerebral infarction, unspecified: Secondary | ICD-10-CM

## 2023-09-04 LAB — CUP PACEART REMOTE DEVICE CHECK
Date Time Interrogation Session: 20250706230438
Implantable Pulse Generator Implant Date: 20210107

## 2023-09-05 ENCOUNTER — Ambulatory Visit: Payer: Self-pay | Admitting: Cardiovascular Disease

## 2023-09-21 NOTE — Progress Notes (Signed)
 Carelink Summary Report / Loop Recorder

## 2023-10-10 ENCOUNTER — Telehealth: Payer: Self-pay | Admitting: Student

## 2023-10-10 NOTE — Telephone Encounter (Signed)
 Pt called and stated that she is unable to connect her pacemaker to app on her phone. Please advise

## 2023-12-11 NOTE — Progress Notes (Signed)
 Remote Loop Recorder Transmission
